# Patient Record
Sex: Male | Born: 1955 | Race: White | Hispanic: No | Marital: Married | Smoking: Never smoker
Health system: Southern US, Community
[De-identification: ages and names within clinical notes are randomized; demographics above are authoritative.]

## PROBLEM LIST (undated history)

## (undated) DIAGNOSIS — R519 Headache, unspecified: Secondary | ICD-10-CM

## (undated) DIAGNOSIS — Z8679 Personal history of other diseases of the circulatory system: Secondary | ICD-10-CM

## (undated) DIAGNOSIS — N200 Calculus of kidney: Secondary | ICD-10-CM

## (undated) DIAGNOSIS — I712 Thoracic aortic aneurysm, without rupture: Secondary | ICD-10-CM

## (undated) DIAGNOSIS — Z87442 Personal history of urinary calculi: Secondary | ICD-10-CM

## (undated) DIAGNOSIS — R51 Headache: Secondary | ICD-10-CM

## (undated) DIAGNOSIS — G4730 Sleep apnea, unspecified: Secondary | ICD-10-CM

## (undated) DIAGNOSIS — I7121 Aneurysm of the ascending aorta, without rupture: Secondary | ICD-10-CM

## (undated) DIAGNOSIS — E0590 Thyrotoxicosis, unspecified without thyrotoxic crisis or storm: Secondary | ICD-10-CM

## (undated) DIAGNOSIS — M1990 Unspecified osteoarthritis, unspecified site: Secondary | ICD-10-CM

## (undated) DIAGNOSIS — I10 Essential (primary) hypertension: Secondary | ICD-10-CM

## (undated) DIAGNOSIS — K219 Gastro-esophageal reflux disease without esophagitis: Secondary | ICD-10-CM

## (undated) DIAGNOSIS — I350 Nonrheumatic aortic (valve) stenosis: Secondary | ICD-10-CM

## (undated) DIAGNOSIS — R011 Cardiac murmur, unspecified: Secondary | ICD-10-CM

## (undated) DIAGNOSIS — I639 Cerebral infarction, unspecified: Secondary | ICD-10-CM

## (undated) DIAGNOSIS — R7302 Impaired glucose tolerance (oral): Secondary | ICD-10-CM

## (undated) DIAGNOSIS — T7840XA Allergy, unspecified, initial encounter: Secondary | ICD-10-CM

## (undated) HISTORY — PX: CARDIAC CATHETERIZATION: SHX172

## (undated) HISTORY — DX: Calculus of kidney: N20.0

## (undated) HISTORY — DX: Personal history of other diseases of the circulatory system: Z86.79

## (undated) HISTORY — PX: UMBILICAL HERNIA REPAIR: SHX196

## (undated) HISTORY — PX: CARDIAC VALVE REPLACEMENT: SHX585

## (undated) HISTORY — PX: CYSTOSCOPY: SUR368

## (undated) HISTORY — DX: Impaired glucose tolerance (oral): R73.02

## (undated) HISTORY — PX: HERNIA REPAIR: SHX51

## (undated) HISTORY — DX: Allergy, unspecified, initial encounter: T78.40XA

## (undated) HISTORY — PX: ESOPHAGOGASTRODUODENOSCOPY: SHX1529

## (undated) HISTORY — DX: Cerebral infarction, unspecified: I63.9

---

## 2001-08-08 ENCOUNTER — Encounter: Payer: Self-pay | Admitting: Family Medicine

## 2001-08-08 ENCOUNTER — Ambulatory Visit (HOSPITAL_COMMUNITY): Admission: RE | Admit: 2001-08-08 | Discharge: 2001-08-08 | Payer: Self-pay | Admitting: Family Medicine

## 2001-08-11 ENCOUNTER — Encounter: Payer: Self-pay | Admitting: Family Medicine

## 2001-08-11 ENCOUNTER — Ambulatory Visit (HOSPITAL_COMMUNITY): Admission: RE | Admit: 2001-08-11 | Discharge: 2001-08-11 | Payer: Self-pay | Admitting: Family Medicine

## 2001-09-15 ENCOUNTER — Ambulatory Visit (HOSPITAL_COMMUNITY): Admission: RE | Admit: 2001-09-15 | Discharge: 2001-09-15 | Payer: Self-pay | Admitting: Urology

## 2001-09-15 ENCOUNTER — Encounter: Payer: Self-pay | Admitting: Urology

## 2003-12-25 HISTORY — PX: KNEE ARTHROSCOPY: SUR90

## 2004-06-12 ENCOUNTER — Ambulatory Visit (HOSPITAL_COMMUNITY): Admission: RE | Admit: 2004-06-12 | Discharge: 2004-06-12 | Payer: Self-pay | Admitting: Pulmonary Disease

## 2004-07-20 ENCOUNTER — Ambulatory Visit (HOSPITAL_COMMUNITY): Admission: RE | Admit: 2004-07-20 | Discharge: 2004-07-20 | Payer: Self-pay | Admitting: Orthopedic Surgery

## 2004-08-15 ENCOUNTER — Ambulatory Visit (HOSPITAL_COMMUNITY): Admission: RE | Admit: 2004-08-15 | Discharge: 2004-08-15 | Payer: Self-pay | Admitting: Orthopedic Surgery

## 2004-08-17 ENCOUNTER — Encounter (HOSPITAL_COMMUNITY): Admission: RE | Admit: 2004-08-17 | Discharge: 2004-09-16 | Payer: Self-pay | Admitting: Orthopedic Surgery

## 2004-10-30 ENCOUNTER — Ambulatory Visit: Payer: Self-pay | Admitting: Orthopedic Surgery

## 2004-12-06 ENCOUNTER — Ambulatory Visit: Payer: Self-pay | Admitting: Orthopedic Surgery

## 2005-03-17 ENCOUNTER — Ambulatory Visit: Admission: RE | Admit: 2005-03-17 | Discharge: 2005-03-17 | Payer: Self-pay | Admitting: Pulmonary Disease

## 2005-03-21 ENCOUNTER — Ambulatory Visit: Payer: Self-pay | Admitting: Pulmonary Disease

## 2006-03-15 ENCOUNTER — Ambulatory Visit: Payer: Self-pay | Admitting: Internal Medicine

## 2006-03-15 ENCOUNTER — Encounter: Payer: Self-pay | Admitting: Emergency Medicine

## 2006-03-15 ENCOUNTER — Inpatient Hospital Stay (HOSPITAL_COMMUNITY): Admission: EM | Admit: 2006-03-15 | Discharge: 2006-03-16 | Payer: Self-pay | Admitting: Emergency Medicine

## 2006-06-14 ENCOUNTER — Ambulatory Visit (HOSPITAL_COMMUNITY): Admission: RE | Admit: 2006-06-14 | Discharge: 2006-06-14 | Payer: Self-pay | Admitting: General Surgery

## 2006-08-22 ENCOUNTER — Ambulatory Visit (HOSPITAL_COMMUNITY): Admission: RE | Admit: 2006-08-22 | Discharge: 2006-08-22 | Payer: Self-pay | Admitting: General Surgery

## 2007-07-16 ENCOUNTER — Ambulatory Visit (HOSPITAL_COMMUNITY): Admission: RE | Admit: 2007-07-16 | Discharge: 2007-07-16 | Payer: Self-pay | Admitting: Pulmonary Disease

## 2007-08-18 ENCOUNTER — Ambulatory Visit (HOSPITAL_COMMUNITY): Admission: RE | Admit: 2007-08-18 | Discharge: 2007-08-18 | Payer: Self-pay | Admitting: Pulmonary Disease

## 2009-12-22 ENCOUNTER — Emergency Department (HOSPITAL_COMMUNITY): Admission: EM | Admit: 2009-12-22 | Discharge: 2009-12-22 | Payer: Self-pay | Admitting: Emergency Medicine

## 2011-01-14 ENCOUNTER — Encounter: Payer: Self-pay | Admitting: Pulmonary Disease

## 2011-05-11 NOTE — Procedures (Signed)
NAME:  Cameron [name], Cameron [name] NO.:  [id number]   MEDICAL RECORD NO.:  [id number]          PATIENT TYPE:  OUT   LOCATION:  SLEEP LAB                     FACILITY:  [facility]   PHYSICIAN:  Marcelyn Bruins, M.D. [facility] DATE OF BIRTH:  04/22/1956   DATE OF STUDY:  03/17/2005                              NOCTURNAL POLYSOMNOGRAM   REFERRING PHYSICIAN:  Dr. Kari Baars.   INDICATION FOR THE STUDY:  Hypersomnia with sleep apnea.   SLEEP ARCHITECTURE:  The patient had a total sleep time of 381 minutes with  a sleep efficiency of 89%. There was adequate REM but decreased slow wave  sleep. Sleep onset latency was normal as was REM onset.   IMPRESSION:  1.  Split night study reveals moderate obstructive sleep apnea/hypopnea      syndrome with 53 obstructive events noted in the first 147 minutes of      sleep. This gave the patient a respiratory disturbance index of 22      events per hour with O2 desaturation as low as 89%. There was moderate      snoring noted during the study. By protocol the patient was then placed      on a standard ResMed C-PAP mask and ultimately titrated to a final      pressure of 7 cm with excellent control of events.  2.  No clinically significant cardiac arrhythmia.  3.  Moderate numbers of leg jerks with some sleep disruption. Clinical      correlation is suggested after appropriate treatment for his sleep      apnea.      KC/MEDQ  D:  03/21/2005 12:32:36  T:  03/21/2005 15:57:16  Job:  [id number]

## 2011-05-11 NOTE — H&P (Signed)
NAME:  Cameron [name], Cameron [name] NO.:  [id number]   MEDICAL RECORD NO.:  [id number]          PATIENT TYPE:  AMB   LOCATION:  DAY                           FACILITY:  [facility]   PHYSICIAN:  Dalia Heading, M.D.  DATE OF BIRTH:  07-Jun-1956   DATE OF ADMISSION:  06/14/2006  DATE OF DISCHARGE:  LH                                HISTORY & PHYSICAL   CHIEF COMPLAINT:  Umbilical hernia.   HISTORY OF PRESENT ILLNESS:  The patient is a 55 year old white male who was  referred for evaluation treatment of umbilical hernia.  He has had an  episode of abdominal pain with protrusion of the umbilical hernia last week.  Did resolve spontaneously.  He was found on CT scan of the abdomen to have  mild sigmoid diverticulitis which has resolved.  No nausea or vomiting have  been noted.   PAST MEDICAL HISTORY:  Was unremarkable.   PAST SURGICAL HISTORY:  Lithotripsy, cardiac catheterization, cystoscopy  with stone extraction.   CURRENT MEDICATIONS:  1.  Ciprofloxacin 500 mg p.o. b.i.d.  2.  Metronidazole 500 mg p.o. 3 times a day.  3.  Baby aspirin which he is holding.  4.  Ultram as needed for chronic back pain.  5.  Protonix 40 mg p.o. daily.   ALLERGIES:  No known drug allergies.   REVIEW OF SYSTEMS:  The patient denies any smoking or alcohol use.  He  denies any recent MI, CVA, or diabetes mellitus.   He denies any bleeding disorders.   PHYSICAL EXAMINATION:  GENERAL: The patient is a well-developed, well-  nourished white male in no acute distress.  LUNGS:  Clear to auscultation with equal breath sounds bilaterally.  HEART: Examination reveals regular rate and rhythm without S3, S4, or  murmurs.  ABDOMEN: Soft, nontender, nondistended.  No hepatosplenomegaly or masses  noted.  A reducible umbilical hernia is present.   IMPRESSION:  Umbilical hernia.   PLAN:  The patient is scheduled for an umbilical herniorrhaphy on June 14, 2006.  Risks and benefits of the procedure  including bleeding, infection,  recurrence of the hernia were fully explained to the patient, gave informed  consent.      Dalia Heading, M.D.  Electronically Signed     MAJ/MEDQ  D:  06/11/2006  T:  06/11/2006  Job:  [id number]   cc:   [facility]  Fax: [phone]   Oneal Deputy. Juanetta Gosling, M.D.  Fax: [phone]

## 2011-05-11 NOTE — Op Note (Signed)
NAME:  Cameron [name], Cameron [name]                         ACCOUNT NO.:  [id number]   MEDICAL RECORD NO.:  [id number]                   PATIENT TYPE:  AMB   LOCATION:  DAY                                  FACILITY:  [facility]   PHYSICIAN:  Vickki Hearing, M.D.           DATE OF BIRTH:  [birth year]   DATE OF PROCEDURE:  08/15/2004  DATE OF DISCHARGE:                                 OPERATIVE REPORT   HISTORY:  Abisai Deer presented with left knee pain without history of  trauma. He did remember increased activity, walking 15 to 16 hours per day  on concrete, but no direct injury. Complained of medial site of pain with  clicking and popping inside the knee. Initial treatment with anti-  inflammatories, 500-mg Relafen b.i.d., did not help. He had findings  consistent with medial meniscal tear and was sent for MRI. After MRI which  was inconclusive, we continued with nonoperative treatment, but due to the  horizontal signal change and possible horizontal tear of the meniscus on MRI  and his continued pain and dysfunction of the knee despite bracing, we  offered him surgery which he accepted.   PREOPERATIVE DIAGNOSIS:  Medial meniscal tear.   POSTOPERATIVE DIAGNOSIS:  Chondromalacia, medial femoral condyle and  patella, with small synovial pathologic plica.   PROCEDURE:  Chondroplasty, excision of plica.   SURGEON:  Vickki Hearing, M.D.   ANESTHESIA:  General.   OPERATIVE FINDINGS:  There was a softening of the cartilage along the medial  femoral condyle at its medial edge where he complained of pain. There was a  kissing lesion on the medial facet of the patella along with a small  synovial plica which appeared pathologic.   DESCRIPTION OF PROCEDURE:  After being identified in the preoperative  holding area and marking the left knee as the surgical site, the chart was  reviewed to confirm the left knee was the surgical site and arthroscopy was  to be performed. Mr. Lardizabal had previously  marked his left knee as the  surgical area. We took him to the operating room for general anesthesia,  prepped and draped his leg using sterile technique. After this, we took a  time out and then continued with the procedure. During the time out, we  noted that the patient was indeed Fredderick Phenix, that he was to have his  left knee operated on for arthroscopy, everyone agreed, and we continued.   The knee was addressed with the two-incision diagnostic arthroscopy. We did  a circumferential tour of the knee and palpated and visualized all  structures. Patellofemoral compartment was noted to be normal except for its  medial facet where there was a kissing lesion with the medial femoral  condyle, and there was softening of the cartilage with a flap of cartilage  from the medial facet. There was a plica in this area which appeared to be  rubbing the chondral flap on  the medial femoral condyle, and this was  debrided using a shaver and a 90-degree ArthroCare wand. We irrigated the  knee, suctioned it dry, applied steri strips, and 30 cc of Marcaine was  injected into the knee. A sterile  bandage was applied as well as CryoCuff. He was extubated, taken to the  recovery room in stable condition. He can start full weight bearing and can  start physical therapy as soon as possible. He will be followed in 2 days  for a dressing change and wound check.      ___________________________________________                                            Vickki Hearing, M.D.   SEH/MEDQ  D:  08/15/2004  T:  08/15/2004  Job:  [id number]

## 2011-05-11 NOTE — H&P (Signed)
NAME:  GROVER, ROBINSON NO.:  [id number]   MEDICAL RECORD NO.:  [id number]          PATIENT TYPE:  INP   LOCATION:  2038                         FACILITY:  [facility]   PHYSICIAN:  Pricilla Riffle, M.D.    DATE OF BIRTH:  [birth year]   DATE OF ADMISSION:  03/15/2006  DATE OF DISCHARGE:                                HISTORY & PHYSICAL   IDENTIFICATION:  Mr. Muegge is a 55 year old gentleman who comes to the  emergency room today for evaluation of chest pain.   HISTORY OF PRESENT ILLNESS:  The patient has a history of mitral valve  prolapse.  He was diagnosed at time of cath in 1992/1993.  Note, he had a  cardiac catheterization at the time for chest pain which was different than  today's, by his report it was normal though we have no records.  The patient  yesterday began having chest discomfort at work about 5 p.m. that was like  somebody was sitting over his chest with a mild intensity but continued on  through the night.  He went to work today and about 8:30 the pain  intensified to 5 or 6 out of 10.  A co-worker at the time said he looked a  little ashen.  He was feeling weak.  He notes no pleuritic component.  No  positional component.  No cough, fevers, chills.  He feels chilled here in  the ER but not prior.  He does have a history of reflux but this is  different.  Given two nitroglycerin sprays by EMS without much relief and  one sublingual nitro with a decrease in pain to a 2 out of 10.  He has some  associated nausea, notes no change in his bowel movements.   ALLERGIES:  None.   MEDICATIONS:  Ultram 50 mg one half tab b.i.d.  Here at [facility] ER,  heparin given, nitroglycerin held because of hypotension.   PAST MEDICAL HISTORY:  1.  Mitral valve prolapse by report.  2.  History of nephrolithiasis.  3.  History of moderate obstructive sleep apnea.  The patient wears CPAP.  4.  History of arthritis.  5.  History of hypoglycemia.  6.  Question CVA in 1993,  a year after the cath.  We have no records on      this, may have some short term memory loss.  Had weakness on his left      side from this he said, but again no record.   SOCIAL HISTORY:  The patient is a Midwife.  He lives in [place],  is married with children, never smoked, rarely drinks.  Walks a lot.  Wife  notes some fatigue recently.   FAMILY HISTORY:  Mother is 51, had a history of stroke.  She has a  pacemaker.  Father died of cancer, age 20.  One sister with kidney problems,  unknown type.   REVIEW OF SYSTEMS:  All systems reviewed except as noted above.  Negative to  the above problem.   PHYSICAL EXAMINATION:  GENERAL:  The patient currently notes his  chest  pressure is a 2 out of 10 in intensity, though he appears in no acute  distress, somewhat nauseated though.  VITAL SIGNS:  Blood pressure 105/65, pulse is 50-55 and regular, temperature  is 97.9, O2 sat on 2 liters is 100%.  HEENT:  Normocephalic atraumatic.  EOMI.  Sclerae are clear.  NECK:  No bruits.  JVP is normal.  No thyromegaly.  LUNGS:  Clear to auscultation.  CARDIAC:  Regular rate and rhythm.  S1 S2.  No S3, S4, murmurs, or clicks  noted.  ABDOMEN:  Supple.  Nontender.  No hepatosplenomegaly.  EXTREMITIES:  Good distal pulses throughout.  No lower extremity edema.  No  bruits.  NEUROLOGIC:  Alert and oriented x3.  Cranial nerves II-XII intact.   EKG shows a normal sinus rhythm at a rate of 64 beats per minute.  Left  anterior fascicular block.   LABORATORY:  Significant for a hemoglobin of 14.8, WBC of 8.1, BUN  creatinine of 9 and 0.9, potassium of 3.9.  D-dimer is 0.24.  BNP less than  30.  Troponin less than 0.05.  MB was less than 1.   IMPRESSION:  The patient is a 55 year old with a history of mitral valve  prolapse, question cerebrovascular accident in the past - no records, comes  in now with chest pain since yesterday.   No acute ST changes.  Initial CK was negative after prolonged  pain.  The  pain is atypical for angina, though we will continue to follow with serial  enzymes.  We will treat also for gastrointestinal with Protonix, GI  cocktail. If the enzymes are negative, would recommend a Cardiolite/Myoview  to evaluate otherwise if positive recommend cardiac catheterization.  Check  fasting lipids in the morning.  Continue CPAP.           ______________________________  Pricilla Riffle, M.D.     PVR/MEDQ  D:  03/15/2006  T:  03/16/2006  Job:  [id number]   cc:   Ramon Dredge L. Juanetta Gosling, M.D.  Fax: [phone]

## 2011-05-11 NOTE — H&P (Signed)
NAME:  KIT, MOLLETT NO.:  [id number]   MEDICAL RECORD NO.:  [id number]           PATIENT TYPE:  AMB   LOCATION:                                FACILITY:  [facility]   PHYSICIAN:  Dalia Heading, M.D.  DATE OF BIRTH:  1956-11-27   DATE OF ADMISSION:  08/22/2006  DATE OF DISCHARGE:  LH                                HISTORY & PHYSICAL   CHIEF COMPLAINT:  History of sigmoid diverticulitis.   HISTORY OF PRESENT ILLNESS:  The patient is a 55 year old white male who  presents for colonoscopy.  He had an episode of sigmoid diverticulitis  earlier this year and is now presenting for followup colonoscopy.  Denies  any abdominal pain, nausea, vomiting, diarrhea, or constipation.  Denies any  hematochezia.   PAST MEDICAL HISTORY:  Unremarkable.   PAST SURGICAL HISTORY:  1. Lithotripsy.  2. Umbilical herniorrhaphy.  3. Cardiac catheterization.  4. Cystoscopy   CURRENT MEDICATIONS:  Baby aspirin, Protonix.   ALLERGIES:  No known drug allergies.   REVIEW OF SYSTEMS:  Noncontributory.   PHYSICAL EXAMINATION:  GENERAL: The patient is a well-developed, well-  nourished white male in no acute distress.  LUNGS:  Clear to auscultation with equal breath sounds bilaterally.  HEART: Examination reveals regular rate and rhythm without S3, S4, murmurs.  ABDOMEN: The abdomen is soft, nontender, nondistended.  No  hepatosplenomegaly or masses are noted.  RECTAL:  Examination was deferred to the procedure.   IMPRESSION:  History of sigmoid diverticulitis.   PLAN:  The patient is scheduled for colonoscopy on August 22, 2006.  The  risks and benefits of the procedure including bleeding and perforation were  fully explained to the patient, gave informed consent.      Dalia Heading, M.D.  Electronically Signed     MAJ/MEDQ  D:  08/13/2006  T:  08/13/2006  Job:  [id number]   cc:   [facility]  Fax: [phone]   Oneal Deputy. Juanetta Gosling, M.D.  Fax: [phone]

## 2011-05-11 NOTE — Op Note (Signed)
NAME:  TRACEN, MAHLER NO.:  [id number]   MEDICAL RECORD NO.:  [id number]          PATIENT TYPE:  AMB   LOCATION:  DAY                           FACILITY:  [facility]   PHYSICIAN:  Dalia Heading, M.D.  DATE OF BIRTH:  08-09-1956   DATE OF PROCEDURE:  06/14/2006  DATE OF DISCHARGE:                                 OPERATIVE REPORT   PREOPERATIVE DIAGNOSIS:  Umbilical hernia.   POSTOPERATIVE DIAGNOSIS:  Umbilical hernia.   PROCEDURE:  Umbilical herniorrhaphy.   SURGEON:  Dalia Heading, M.D.   ANESTHESIA:  General endotracheal.   INDICATIONS:  The patient is a 55 year old white male who presents with an  umbilical hernia which is symptomatic.  The risks and benefits of the  procedure including bleeding, infection, and recurrence of the umbilical  hernia were fully explained to the patient, who gave informed consent.   PROCEDURE NOTE:  The patient was placed in the supine position.  After  general endotracheal anesthesia was administered, the abdomen was prepped  and draped in the usual sterile technique with Betadine.  Surgical site  confirmation was performed.   An infraumbilical incision was made down to the fascia.  The umbilicus was  freed away from the underlying hernia defect.  Properitoneal fat and a small  piece of omentum was noted within the hernia.  This was excised along with  the hernia sac.  The contents were reduced.  The defect was less than 1 cm  in its greatest diameter.  The defect was closed transversely using #0  Surgitek interrupted sutures.  The base of the umbilicus was secured back to  the fascia using a 2-0 Vicryl interrupted suture.  Subcutaneous layer was  reapproximated using a 3-0 Vicryl interrupted suture.  The skin was closed  using staples.  Then 0.5 cm Sensorcaine was instilled into the surrounding  wound.  Betadine ointment and dry sterile dressing were then applied.   All tape and needle counts were correct at the end of the  procedure.  The  patient was extubated in the operating room and went back to recovery room  awake in stable condition.   COMPLICATIONS:  None.   SPECIMEN:  None.   BLOOD LOSS:  Minimal.      Dalia Heading, M.D.  Electronically Signed     MAJ/MEDQ  D:  06/14/2006  T:  06/14/2006  Job:  [id number]   cc:   Ramon Dredge L. Juanetta Gosling, M.D.  Fax: [phone]

## 2011-05-11 NOTE — H&P (Signed)
NAME:  Cameron [name], COLLEGE                         ACCOUNT NO.:  [id number]   MEDICAL RECORD NO.:  [id number]                   PATIENT TYPE:  AMB   LOCATION:  DAY                                  FACILITY:  [facility]   PHYSICIAN:  Vickki Hearing, M.D.           DATE OF BIRTH:  1956/08/31   DATE OF ADMISSION:  08/14/2004  DATE OF DISCHARGE:                                HISTORY & PHYSICAL   CHIEF COMPLAINT:  Left knee pain.   HISTORY OF PRESENT ILLNESS:  Cameron [name] presented with left knee pain  without history of trauma.  He remembers increased activity such as walking  15-16 hours on concrete, but other than that, no direct injury.  He  complains of severe medial sided knee pain with an associated clicking and  popping sensation inside the knee.  Initial treatment was with anti-  inflammatories 500 mg of Relafen b.i.d.  Clinical exam was consistent with  meniscal tear and he was sent for MRI.  After MRI, we continued with  nonoperative treatment.  We did note that he had horizontal type signal  change perhaps consistent with horizontal meniscal tear of medial meniscus.  We put him in a brace and he still did not improve and after 8 weeks now, he  presents for medial menisectomy of the left knee with surgical diagnostic  and operative arthroscopy.   PAST MEDICAL HISTORY:  1. Mitral valve regurgitation which requires prophylactic treatment prior to     any surgery with antibiotics.  2. History of kidney stones.   ALLERGIES:  No known drug allergies.   PAST SURGICAL HISTORY:  1. Cardiac catheterization in 1992.  2. Lithotripsy in 2003.   MEDICATIONS:  Ultram 50 mg.   FAMILY HISTORY:  Family history of heart and kidney disease as well as  cancer.   PRIMARY CARE PHYSICIAN:  Edward L. Juanetta Gosling, M.D.   SOCIAL HISTORY:  He is married and works at the [employer].  No  social habits such as smoking or drinking and has a high school diploma.   PHYSICAL EXAMINATION:   VITAL SIGNS:  Weight 165, pulse 82, respirations 20.  HEENT:  Within normal limits.  NECK:  Supple.  CHEST:  Clear.  HEART:  Rate and rhythm.  ABDOMEN:  Soft.  EXTREMITIES:  Left knee medial tenderness in the compartment and joint line.  Hyperflexion causes pain as does hyperextension with the screw home test.  Stability and strength are otherwise normal.  His alignment is good without  contracture, subluxation, atrophy or tremor in the musculature.  SKIN:  Intact.  NEUROLOGIC:  Normal sensation and reflexes.  He is awake, alert and oriented  x3.   ASSESSMENT/PLAN:  MRI shows horizontal signal change consistent with most  likely medial meniscal tear.  Recommend arthroscopy of left knee.     ___________________________________________  Vickki Hearing, M.D.   SEH/MEDQ  D:  08/14/2004  T:  08/14/2004  Job:  [id number]

## 2011-05-11 NOTE — Discharge Summary (Signed)
NAMEMarland Kitchen  [name], [name] NO.:  [id number]   MEDICAL RECORD NO.:  [id number]          PATIENT TYPE:  INP   LOCATION:  2038                         FACILITY:  [facility]   PHYSICIAN:  Verne Grain, MD   DATE OF BIRTH:  December 09, 1956   DATE OF ADMISSION:  03/15/2006  DATE OF DISCHARGE:  03/16/2006                                 DISCHARGE SUMMARY   REASON FOR ADMISSION:  Chest discomfort.   DISCHARGE DIAGNOSIS:  1.  Noncardiac chest pain.  2.  Mitral valve prolapse.  3.  Questionable history of cerebrovascular accident in 1993-no records      available.  4.  History of cardiac catheterization in 1992.      1.  Normal, per patient report-no records available.  5.  Obstructive sleep apnea treated with continuous positive airway      pressure.  6.  History of arthritis.  7.  History of nephrolithiasis.  8.  Hypertriglyceridemia.      1.  Total cholesterol this admission 155, triglycerides 237, HDL 42 and          LDL 66.   ALLERGIES:  NO KNOWN DRUG ALLERGIES.   HISTORY:  the patient is a 55 year old male with a history of mitral valve  prolapse who presented to [facility] ER with complaints of chest discomfort  for about 1 day.  He was transferred to [facility] for further  evaluation.   HOSPITAL COURSE:  As noted above, the patient was transferred for further  evaluation of his chest discomfort.  He ruled out for myocardial infarction  by enzymes and electrocardiogram.  He went for a stress Myoview study on  March 16, 2006.  He exercised for 9 minutes and 59 seconds.  The test had to  be stopped secondary to fatigue and he did not achieve 85% of his predicted  maximum heart rate.  Therefore, the test was changed to an adenosine test.  Images revealed no ischemia and an EF of 71%.  The patient was discharged to  home in stable condition by Dr. Christin Fudge.  It was felt that he can follow up  with his primary care physician and could follow up with cardiology on  a  p.r.n. basis.   LABORATORY AT DISCHARGE:  White count 7300, hemoglobin 13.8, hematocrit 40.3  and platelet count 198,000.  INR 1.  Sodium 138, potassium 3.9, glucose 102,  BUN 11 and creatinine 1.  Total protein 5.4, albumin 3.4, AST 15, ALT 20,  alkaline phosphatase 33 and total bilirubin 0.7.  Cardiac enzymes as noted  above.  Lipid panel is noted above.  TSH 1.89.  D-dimer 0.24.  Chest x-ray  no acute disease.  BNP level less than 30.   DISCHARGE MEDICATIONS:  1.  Protonix 40 mg daily.  2.  Ultram p.r.n.  3.  Coated aspirin 81 mg daily.   FOLLOW UP:  Dr. Juanetta [name] in 1-2 weeks post discharge and the patient was  asked to call for an appointment.  He can follow up with [facility]  in [place] as needed.  Tereso Newcomer, P.A.    ______________________________  Verne Grain, MD    SW/MEDQ  D:  04/30/2006  T:  05/01/2006  Job:  [id number]   cc:   Cameron [name], M.D.  Fax: [phone]

## 2015-11-24 HISTORY — PX: LITHOTRIPSY: SUR834

## 2015-12-01 ENCOUNTER — Ambulatory Visit (HOSPITAL_COMMUNITY)
Admission: RE | Admit: 2015-12-01 | Discharge: 2015-12-01 | Disposition: A | Discharge status: Home / Self Care | Payer: BLUE CROSS/BLUE SHIELD | Source: Ambulatory Visit | Attending: Pulmonary Disease | Admitting: Pulmonary Disease

## 2015-12-01 ENCOUNTER — Other Ambulatory Visit (HOSPITAL_COMMUNITY): Payer: Self-pay | Admitting: Pulmonary Disease

## 2015-12-01 DIAGNOSIS — N132 Hydronephrosis with renal and ureteral calculous obstruction: Secondary | ICD-10-CM | POA: Insufficient documentation

## 2015-12-01 DIAGNOSIS — N200 Calculus of kidney: Secondary | ICD-10-CM | POA: Diagnosis not present

## 2015-12-01 DIAGNOSIS — I2510 Atherosclerotic heart disease of native coronary artery without angina pectoris: Secondary | ICD-10-CM | POA: Diagnosis not present

## 2015-12-01 DIAGNOSIS — K4090 Unilateral inguinal hernia, without obstruction or gangrene, not specified as recurrent: Secondary | ICD-10-CM | POA: Insufficient documentation

## 2015-12-01 DIAGNOSIS — R1084 Generalized abdominal pain: Secondary | ICD-10-CM

## 2015-12-01 DIAGNOSIS — N201 Calculus of ureter: Secondary | ICD-10-CM | POA: Insufficient documentation

## 2015-12-01 DIAGNOSIS — N400 Enlarged prostate without lower urinary tract symptoms: Secondary | ICD-10-CM | POA: Insufficient documentation

## 2015-12-01 DIAGNOSIS — R109 Unspecified abdominal pain: Secondary | ICD-10-CM | POA: Diagnosis present

## 2015-12-02 ENCOUNTER — Encounter (HOSPITAL_COMMUNITY)
Admission: AD | Disposition: A | Discharge status: Home / Self Care | Payer: Self-pay | Source: Ambulatory Visit | Attending: Urology

## 2015-12-02 ENCOUNTER — Encounter (HOSPITAL_COMMUNITY): Payer: Self-pay | Admitting: *Deleted

## 2015-12-02 ENCOUNTER — Other Ambulatory Visit: Payer: Self-pay | Admitting: Urology

## 2015-12-02 ENCOUNTER — Ambulatory Visit (HOSPITAL_COMMUNITY): Payer: BLUE CROSS/BLUE SHIELD | Admitting: Anesthesiology

## 2015-12-02 ENCOUNTER — Ambulatory Visit (INDEPENDENT_AMBULATORY_CARE_PROVIDER_SITE_OTHER): Payer: BLUE CROSS/BLUE SHIELD | Admitting: Urology

## 2015-12-02 ENCOUNTER — Ambulatory Visit (HOSPITAL_COMMUNITY)
Admission: AD | Admit: 2015-12-02 | Discharge: 2015-12-02 | Disposition: A | Discharge status: Home / Self Care | Payer: BLUE CROSS/BLUE SHIELD | Source: Ambulatory Visit | Attending: Urology | Admitting: Urology

## 2015-12-02 DIAGNOSIS — F17210 Nicotine dependence, cigarettes, uncomplicated: Secondary | ICD-10-CM | POA: Insufficient documentation

## 2015-12-02 DIAGNOSIS — M1990 Unspecified osteoarthritis, unspecified site: Secondary | ICD-10-CM | POA: Diagnosis not present

## 2015-12-02 DIAGNOSIS — E0590 Thyrotoxicosis, unspecified without thyrotoxic crisis or storm: Secondary | ICD-10-CM | POA: Insufficient documentation

## 2015-12-02 DIAGNOSIS — Z79899 Other long term (current) drug therapy: Secondary | ICD-10-CM | POA: Insufficient documentation

## 2015-12-02 DIAGNOSIS — N201 Calculus of ureter: Secondary | ICD-10-CM

## 2015-12-02 DIAGNOSIS — I10 Essential (primary) hypertension: Secondary | ICD-10-CM | POA: Diagnosis not present

## 2015-12-02 DIAGNOSIS — F17290 Nicotine dependence, other tobacco product, uncomplicated: Secondary | ICD-10-CM | POA: Diagnosis not present

## 2015-12-02 DIAGNOSIS — E119 Type 2 diabetes mellitus without complications: Secondary | ICD-10-CM | POA: Diagnosis not present

## 2015-12-02 DIAGNOSIS — G4730 Sleep apnea, unspecified: Secondary | ICD-10-CM | POA: Diagnosis not present

## 2015-12-02 DIAGNOSIS — Z87442 Personal history of urinary calculi: Secondary | ICD-10-CM | POA: Insufficient documentation

## 2015-12-02 DIAGNOSIS — K219 Gastro-esophageal reflux disease without esophagitis: Secondary | ICD-10-CM | POA: Insufficient documentation

## 2015-12-02 HISTORY — DX: Gastro-esophageal reflux disease without esophagitis: K21.9

## 2015-12-02 HISTORY — DX: Thyrotoxicosis, unspecified without thyrotoxic crisis or storm: E05.90

## 2015-12-02 HISTORY — DX: Cardiac murmur, unspecified: R01.1

## 2015-12-02 HISTORY — PX: CYSTOSCOPY W/ URETERAL STENT PLACEMENT: SHX1429

## 2015-12-02 HISTORY — DX: Unspecified osteoarthritis, unspecified site: M19.90

## 2015-12-02 HISTORY — DX: Headache: R51

## 2015-12-02 HISTORY — DX: Sleep apnea, unspecified: G47.30

## 2015-12-02 HISTORY — DX: Headache, unspecified: R51.9

## 2015-12-02 HISTORY — DX: Essential (primary) hypertension: I10

## 2015-12-02 SURGERY — CYSTOSCOPY, WITH RETROGRADE PYELOGRAM AND URETERAL STENT INSERTION
Anesthesia: General | Laterality: Left

## 2015-12-02 MED ORDER — CEFAZOLIN SODIUM-DEXTROSE 2-3 GM-% IV SOLR
2.0000 g | INTRAVENOUS | Status: AC
  Administered 2015-12-02: 2 g via INTRAVENOUS

## 2015-12-02 MED ORDER — ACETAMINOPHEN 10 MG/ML IV SOLN
INTRAVENOUS | Status: AC
  Filled 2015-12-02: qty 100

## 2015-12-02 MED ORDER — LIDOCAINE HCL (CARDIAC) 20 MG/ML IV SOLN
INTRAVENOUS | Status: AC
  Filled 2015-12-02: qty 5

## 2015-12-02 MED ORDER — ONDANSETRON HCL 4 MG/2ML IJ SOLN: 4.0000 mg | Freq: Once | INTRAMUSCULAR | Status: DC | PRN

## 2015-12-02 MED ORDER — FENTANYL CITRATE (PF) 100 MCG/2ML IJ SOLN
50.0000 ug | INTRAMUSCULAR | Status: AC | PRN
  Administered 2015-12-02 (×3): 50 ug via INTRAVENOUS
  Filled 2015-12-02: qty 2

## 2015-12-02 MED ORDER — LIDOCAINE HCL (CARDIAC) 20 MG/ML IV SOLN
INTRAVENOUS | Status: DC | PRN
  Administered 2015-12-02: 60 mg via INTRAVENOUS

## 2015-12-02 MED ORDER — CEFAZOLIN SODIUM-DEXTROSE 2-3 GM-% IV SOLR
INTRAVENOUS | Status: AC
  Filled 2015-12-02: qty 50

## 2015-12-02 MED ORDER — LACTATED RINGERS IV SOLN
INTRAVENOUS | Status: DC
  Administered 2015-12-02: 19:00:00 via INTRAVENOUS

## 2015-12-02 MED ORDER — TAMSULOSIN HCL 0.4 MG PO CAPS: 0.4000 mg | ORAL_CAPSULE | Freq: Every day | ORAL | Status: DC

## 2015-12-02 MED ORDER — ONDANSETRON 4 MG PO TBDP: 4.0000 mg | ORAL_TABLET | Freq: Three times a day (TID) | ORAL | Status: DC | PRN

## 2015-12-02 MED ORDER — MIDAZOLAM HCL 2 MG/2ML IJ SOLN
INTRAMUSCULAR | Status: AC
  Filled 2015-12-02: qty 2

## 2015-12-02 MED ORDER — OXYCODONE-ACETAMINOPHEN 5-325 MG PO TABS: 1.0000 | ORAL_TABLET | ORAL | Status: DC | PRN

## 2015-12-02 MED ORDER — ACETAMINOPHEN 10 MG/ML IV SOLN
1000.0000 mg | Freq: Once | INTRAVENOUS | Status: AC
  Administered 2015-12-02: 1000 mg via INTRAVENOUS

## 2015-12-02 MED ORDER — MIDAZOLAM HCL 5 MG/5ML IJ SOLN
INTRAMUSCULAR | Status: DC | PRN
  Administered 2015-12-02: 2 mg via INTRAVENOUS

## 2015-12-02 MED ORDER — SODIUM CHLORIDE 0.9 % IR SOLN
Status: DC | PRN
  Administered 2015-12-02 (×2): 3000 mL

## 2015-12-02 MED ORDER — ONDANSETRON HCL 4 MG/2ML IJ SOLN
INTRAMUSCULAR | Status: AC
  Filled 2015-12-02: qty 2

## 2015-12-02 MED ORDER — FENTANYL CITRATE (PF) 100 MCG/2ML IJ SOLN: 25.0000 ug | INTRAMUSCULAR | Status: DC | PRN

## 2015-12-02 MED ORDER — FENTANYL CITRATE (PF) 100 MCG/2ML IJ SOLN
INTRAMUSCULAR | Status: AC
  Filled 2015-12-02: qty 2

## 2015-12-02 MED ORDER — PROPOFOL 10 MG/ML IV BOLUS
INTRAVENOUS | Status: AC
  Filled 2015-12-02: qty 20

## 2015-12-02 MED ORDER — PROPOFOL 10 MG/ML IV BOLUS
INTRAVENOUS | Status: DC | PRN
  Administered 2015-12-02: 200 mg via INTRAVENOUS

## 2015-12-02 MED ORDER — PHENAZOPYRIDINE HCL 100 MG PO TABS: 100.0000 mg | ORAL_TABLET | Freq: Three times a day (TID) | ORAL | Status: DC | PRN

## 2015-12-02 MED ORDER — SUCCINYLCHOLINE CHLORIDE 20 MG/ML IJ SOLN
INTRAMUSCULAR | Status: DC | PRN
  Administered 2015-12-02: 100 mg via INTRAVENOUS

## 2015-12-02 MED ORDER — LACTATED RINGERS IV SOLN: INTRAVENOUS | Status: DC

## 2015-12-02 SURGICAL SUPPLY — 3 items
CLOTH BEACON ORANGE TIMEOUT ST (SAFETY) ×3 IMPLANT
PACK CYSTO (CUSTOM PROCEDURE TRAY) ×3 IMPLANT
STENT CONTOUR 6FRX26X.038 (STENTS) ×3 IMPLANT

## 2015-12-02 NOTE — Transfer of Care (Signed)
Immediate Anesthesia Transfer of Care Note  Patient: Cameron SpenceCharles R [name]  Procedure(s) Performed: Procedure(s): CYSTOSCOPY WITH RETROGRADE PYELOGRAM/URETERAL STENT PLACEMENT (Left)  Patient Location: PACU  Anesthesia Type:General  Level of Consciousness: awake, alert  and oriented  Airway & Oxygen Therapy: Patient Spontanous Breathing and Patient connected to face mask oxygen  Post-op Assessment: Report given to RN and Post -op Vital signs reviewed and stable  Post vital signs: Reviewed and stable  Last Vitals:  Filed Vitals:   12/02/15 1607  BP: 130/82  Pulse: 95  Temp: 36.4 C  Resp: 16    Complications: No apparent anesthesia complications

## 2015-12-02 NOTE — Brief Op Note (Signed)
12/02/2015  8:27 PM  PATIENT:  Cameron [name]  59 y.o. male  PRE-OPERATIVE DIAGNOSIS:  left ureteral obstruction  POST-OPERATIVE DIAGNOSIS:  Left ureteral calculus  PROCEDURE:   Cystoscopy, left retrograde pyelogram, left ureteral stent placement  SURGEON:  Surgeon(s) and Role:    * Malen GauzePatrick L McKenzie, MD - Primary  PHYSICIAN ASSISTANT:   ASSISTANTS: none   ANESTHESIA:   general  EBL:     BLOOD ADMINISTERED:none  DRAINS: left 6x26 JJ ureteral stent without tether  LOCAL MEDICATIONS USED:  NONE  SPECIMEN:  No Specimen  DISPOSITION OF SPECIMEN:  N/A  COUNTS:  YES  TOURNIQUET:  * No tourniquets in log *  DICTATION: .Note written in EPIC  PLAN OF CARE: Discharge to home after PACU  PATIENT DISPOSITION:  PACU - hemodynamically stable.   Delay start of Pharmacological VTE agent (>24hrs) due to surgical blood loss or risk of bleeding: not applicable

## 2015-12-02 NOTE — Anesthesia Preprocedure Evaluation (Addendum)
Anesthesia Evaluation  Patient identified by MRN, date of birth, ID band Patient awake    Reviewed: Allergy & Precautions, NPO status , Patient's Chart, lab work & pertinent test results  History of Anesthesia Complications Negative for: history of anesthetic complications  Airway Mallampati: III  TM Distance: >3 FB Neck ROM: Full    Dental no notable dental hx. (+) Dental Advisory Given   Pulmonary sleep apnea ,    Pulmonary exam normal breath sounds clear to auscultation       Cardiovascular hypertension, Pt. on medications Normal cardiovascular exam Rhythm:Regular Rate:Normal     Neuro/Psych  Headaches, negative psych ROS   GI/Hepatic Neg liver ROS, GERD  Medicated and Controlled,  Endo/Other  diabetes  Renal/GU negative Renal ROS  negative genitourinary   Musculoskeletal  (+) Arthritis ,   Abdominal   Peds negative pediatric ROS (+)  Hematology negative hematology ROS (+)   Anesthesia Other Findings   Reproductive/Obstetrics negative OB ROS                            Anesthesia Physical Anesthesia Plan  ASA: II and emergent  Anesthesia Plan: General   Post-op Pain Management:    Induction: Intravenous, Rapid sequence and Cricoid pressure planned  Airway Management Planned: Oral ETT  Additional Equipment:   Intra-op Plan:   Post-operative Plan: Extubation in OR  Informed Consent: I have reviewed the patients History and Physical, chart, labs and discussed the procedure including the risks, benefits and alternatives for the proposed anesthesia with the patient or authorized representative who has indicated his/her understanding and acceptance.   Dental advisory given  Plan Discussed with: CRNA  Anesthesia Plan Comments:         Anesthesia Quick Evaluation

## 2015-12-02 NOTE — Op Note (Signed)
.  Preoperative diagnosis: Left ureteral stone  Postoperative diagnosis: Same  Procedure: 1 cystoscopy 2. Left retrograde pyelography 3.  Intraoperative fluoroscopy, under one hour, with interpretation 4. Left 6 x 26 JJ stent placement  Attending: Wilkie AyePatrick Navy Rothschild  Anesthesia: General  Estimated blood loss: None  Drains: Left 6 x 26 JJ ureteral stent without tether  Specimens: none  Antibiotics: ancef  Findings: left proximal ureteral stone. Moderate hydronephrosis. No masses/lesions in the bladder. Ureteral orifices in normal anatomic location.  Indications: Patient is a 59 year old male with a history of left ureteral stone and intractable.  After discussing treatment options, they decided proceed with left ureteroscopic stone extraction and left stent placement.  Procedure her in detail: The patient was brought to the operating room and a brief timeout was done to ensure correct patient, correct procedure, correct site.  General anesthesia was administered patient was placed in dorsal lithotomy position.  Their genitalia was then prepped and draped in usual sterile fashion.  A rigid 22 French cystoscope was passed in the urethra and the bladder.  Bladder was inspected free masses or lesions.  the ureteral orifices were in the normal orthotopic locations.  a 6 french ureteral catheter was then instilled into the left ureteral orifice.  a gentle retrograde was obtained and findings noted above.  we then placed a zip wire through the ureteral catheter and advanced up to the renal pelvis.  The ureter was to narrow to accept the ureteroscope so we elected to place a stent.  We then placed a 6 x 26 double-j ureteral stent over the original zip wire.  We then removed the wire and good coil was noted in the the renal pelvis under fluoroscopy and the bladder under direct vision.  the bladder was then drained and this concluded the procedure which was well tolerated by patient.  Complications:  None  Condition: Stable, extubated, transferred to PACU  Plan: Patient is to be discharged home. He will have his stone extraction in 10-14 days.

## 2015-12-02 NOTE — H&P (Signed)
Urology Admission H&P  Chief Complaint: left flank pain  History of Present Illness: Mr Betker is a 59yo with a hx of nephrolithiasis who developed severe left flank pain 3 days ago. He presented to [facility] and was diagnosed with a 49m L UPJ calculus. He has failed MET and he has associated nausea/vomiting.  Past Medical History  Diagnosis Date  . Hypertension     due to kidney stones  . Heart murmur     told as child he had one  . Sleep apnea     has but does not use CPAP  . Hyperthyroidism     has appointment next 12/07/15 to get this assessed.  . Diabetes mellitus without complication (HCC)     elevated A1C  . GERD (gastroesophageal reflux disease)   . Headache     childhood headaches. Does not use migraine medications  . Arthritis     bilateral hips   Past Surgical History  Procedure Laterality Date  . Cystoscopy  1990s    with stone extraction  . Lithotripsy  1990s  . Umbilical hernia repair  late 1990s  . Knee arthroscopy Left 2005  . Esophagogastroduodenoscopy  1990s  . Cardiac catheterization  1990s    mitral valve was abnormal. No other results    Home Medications:  Prescriptions prior to admission  Medication Sig Dispense Refill Last Dose  . HYDROcodone-acetaminophen (NORCO/VICODIN) 5-325 MG tablet Take 1 tablet by mouth every 6 (six) hours as needed for moderate pain.   Past Week at Unknown time  . promethazine (PHENERGAN) 25 MG tablet Take 25 mg by mouth every 6 (six) hours as needed for nausea or vomiting.   Past Week at Unknown time  . traMADol (ULTRAM) 50 MG tablet Take 50-100 mg by mouth every 4 (four) hours as needed for moderate pain.   12/01/2015 at Unknown time   Allergies:  Allergies  Allergen Reactions  . Demerol [Meperidine] Other (See Comments)    Makes pt hyper, the higher the dose the more hyper pt gets  . Stadol [Butorphanol] Other (See Comments)    Hyper- the higher the dose the more hyper pt gets    History reviewed. No pertinent family  history. Social History:  reports that he has been passively smoking Cigarettes and Pipe.  He has smoked for the past 17 years. He does not have any smokeless tobacco history on file. He reports that he drinks alcohol. His drug history is not on file.  Review of Systems  Genitourinary: Positive for urgency and flank pain.  All other systems reviewed and are negative.   Physical Exam:  Vital signs in last 24 hours: Temp:  [97.6 F (36.4 C)] 97.6 F (36.4 C) (12/09 1607) Pulse Rate:  [95] 95 (12/09 1607) Resp:  [16] 16 (12/09 1607) BP: (130)/(82) 130/82 mmHg (12/09 1607) SpO2:  [98 %] 98 % (12/09 1607) Weight:  [68.493 kg (151 lb)] 68.493 kg (151 lb) (12/09 1607) Physical Exam  Constitutional: He is oriented to person, place, and time. He appears well-developed and well-nourished.  HENT:  Head: Normocephalic and atraumatic.  Eyes: EOM are normal. Pupils are equal, round, and reactive to light.  Neck: Normal range of motion. No thyromegaly present.  Cardiovascular: Normal rate and regular rhythm.   Respiratory: Effort normal. No respiratory distress.  GI: Soft. He exhibits no distension.  Musculoskeletal: Normal range of motion.  Neurological: He is alert and oriented to person, place, and time.  Skin: Skin is warm and  dry.  Psychiatric: He has a normal mood and affect. His behavior is normal. Judgment and thought content normal.    Laboratory Data:  No results found for this or any previous visit (from the past 24 hour(s)). No results found for this or any previous visit (from the past 240 hour(s)). Creatinine: No results for input(s): CREATININE in the last 168 hours. Baseline Creatinine: unknown  Impression/Assessment:  59yo with a left ureteral calculus, intractable pain  Plan:  The risks/benefits/alternatives to cysto, left retrograde, left ureteroscopic stone extraction was explained to the patient and he understands and wishes to proceed with surgery  Edoardo Laforte,  Nalayah Hitt L 12/02/2015, 7:36 PM

## 2015-12-02 NOTE — Progress Notes (Signed)
Paged Dr  Tiffany Kocherarigan of anesthesia for pain medications. Orders received.

## 2015-12-02 NOTE — Anesthesia Procedure Notes (Signed)
Procedure Name: Intubation Date/Time: 12/02/2015 8:05 PM Performed by: Thornell MuleSTUBBLEFIELD, Jartavious Mckimmy G Pre-anesthesia Checklist: Patient identified, Emergency Drugs available, Suction available and Patient being monitored Patient Re-evaluated:Patient Re-evaluated prior to inductionOxygen Delivery Method: Circle System Utilized Preoxygenation: Pre-oxygenation with 100% oxygen Intubation Type: IV induction Ventilation: Mask ventilation without difficulty Laryngoscope Size: Miller and 3 Grade View: Grade I Tube type: Oral Tube size: 7.5 mm Number of attempts: 1 Airway Equipment and Method: Stylet and Oral airway Placement Confirmation: ETT inserted through vocal cords under direct vision,  positive ETCO2 and breath sounds checked- equal and bilateral Secured at: 21 cm Tube secured with: Tape Dental Injury: Teeth and Oropharynx as per pre-operative assessment

## 2015-12-04 NOTE — Anesthesia Postprocedure Evaluation (Signed)
Anesthesia Post Note  Patient: Cameron [name]  Procedure(s) Performed: Procedure(s) (LRB): CYSTOSCOPY WITH RETROGRADE PYELOGRAM/URETERAL STENT PLACEMENT (Left)  Patient location during evaluation: PACU Anesthesia Type: General Level of consciousness: awake and alert Pain management: pain level controlled Vital Signs Assessment: post-procedure vital signs reviewed and stable Respiratory status: spontaneous breathing, nonlabored ventilation, respiratory function stable and patient connected to nasal cannula oxygen Cardiovascular status: blood pressure returned to baseline and stable Postop Assessment: no signs of nausea or vomiting Anesthetic complications: no    Last Vitals:  Filed Vitals:   12/02/15 2115 12/02/15 2130  BP: 118/89 141/88  Pulse: 79 80  Temp: 37.4 C   Resp: 17 16    Last Pain:  Filed Vitals:   12/02/15 2143  PainSc: 0-No pain                 [name]

## 2015-12-05 ENCOUNTER — Other Ambulatory Visit: Payer: Self-pay | Admitting: Urology

## 2015-12-05 ENCOUNTER — Encounter (HOSPITAL_COMMUNITY): Payer: Self-pay | Admitting: Urology

## 2015-12-06 ENCOUNTER — Encounter (HOSPITAL_COMMUNITY): Payer: Self-pay | Admitting: Urology

## 2015-12-07 ENCOUNTER — Other Ambulatory Visit (HOSPITAL_COMMUNITY): Payer: Self-pay | Admitting: Pulmonary Disease

## 2015-12-07 DIAGNOSIS — E0590 Thyrotoxicosis, unspecified without thyrotoxic crisis or storm: Secondary | ICD-10-CM

## 2015-12-12 ENCOUNTER — Encounter (HOSPITAL_COMMUNITY)
Admission: RE | Disposition: A | Discharge status: Home / Self Care | Payer: Self-pay | Source: Ambulatory Visit | Attending: Urology

## 2015-12-12 ENCOUNTER — Ambulatory Visit (HOSPITAL_COMMUNITY)
Admission: RE | Admit: 2015-12-12 | Discharge: 2015-12-12 | Disposition: A | Discharge status: Home / Self Care | Payer: BLUE CROSS/BLUE SHIELD | Source: Ambulatory Visit | Attending: Urology | Admitting: Urology

## 2015-12-12 ENCOUNTER — Other Ambulatory Visit (HOSPITAL_COMMUNITY): Payer: BLUE CROSS/BLUE SHIELD

## 2015-12-12 ENCOUNTER — Ambulatory Visit (HOSPITAL_COMMUNITY): Payer: BLUE CROSS/BLUE SHIELD

## 2015-12-12 ENCOUNTER — Encounter (HOSPITAL_COMMUNITY): Payer: Self-pay | Admitting: *Deleted

## 2015-12-12 DIAGNOSIS — Z87442 Personal history of urinary calculi: Secondary | ICD-10-CM | POA: Diagnosis not present

## 2015-12-12 DIAGNOSIS — N201 Calculus of ureter: Secondary | ICD-10-CM | POA: Diagnosis not present

## 2015-12-12 DIAGNOSIS — Z79891 Long term (current) use of opiate analgesic: Secondary | ICD-10-CM | POA: Insufficient documentation

## 2015-12-12 DIAGNOSIS — Z79899 Other long term (current) drug therapy: Secondary | ICD-10-CM | POA: Insufficient documentation

## 2015-12-12 DIAGNOSIS — G4730 Sleep apnea, unspecified: Secondary | ICD-10-CM | POA: Diagnosis not present

## 2015-12-12 SURGERY — LITHOTRIPSY, ESWL
Anesthesia: LOCAL | Laterality: Left

## 2015-12-12 MED ORDER — DIAZEPAM 5 MG PO TABS
10.0000 mg | ORAL_TABLET | ORAL | Status: AC
  Administered 2015-12-12: 10 mg via ORAL
  Filled 2015-12-12: qty 2

## 2015-12-12 MED ORDER — CIPROFLOXACIN HCL 500 MG PO TABS
500.0000 mg | ORAL_TABLET | ORAL | Status: AC
  Administered 2015-12-12: 500 mg via ORAL
  Filled 2015-12-12: qty 1

## 2015-12-12 MED ORDER — DIPHENHYDRAMINE HCL 25 MG PO CAPS
25.0000 mg | ORAL_CAPSULE | ORAL | Status: AC
  Administered 2015-12-12: 25 mg via ORAL
  Filled 2015-12-12: qty 1

## 2015-12-12 MED ORDER — HYDROMORPHONE HCL 1 MG/ML IJ SOLN
1.0000 mg | Freq: Once | INTRAMUSCULAR | Status: AC
  Administered 2015-12-12: 1 mg via INTRAVENOUS
  Filled 2015-12-12: qty 1

## 2015-12-12 MED ORDER — SODIUM CHLORIDE 0.9 % IV SOLN
INTRAVENOUS | Status: DC
  Administered 2015-12-12: 09:00:00 via INTRAVENOUS

## 2015-12-12 NOTE — Progress Notes (Signed)
Patient back from litho truck stating pain 6/10. Shaking - states he does that when in pain. VSS.Called MD and obtained order for IV pain med.

## 2015-12-12 NOTE — Progress Notes (Signed)
Patient stated pain came down to a 4/10 after Dilaudid. Was 5/10 upon admission today. He stated Dilaudid helped "a little" and wants to go home. Has script for pain meds and he and wife aware of all d/c instructions. Voided. VSS. Escorted to car by Charity fundraiserN.

## 2015-12-12 NOTE — Discharge Instructions (Signed)
Lithotripsy, Care After °Refer to this sheet in the next few weeks. These instructions provide you with information on caring for yourself after your procedure. Your health care provider may also give you more specific instructions. Your treatment has been planned according to current medical practices, but problems sometimes occur. Call your health care provider if you have any problems or questions after your procedure. °WHAT TO EXPECT AFTER THE PROCEDURE  °· Your urine may have a red tinge for a few days after treatment. Blood loss is usually minimal. °· You may have soreness in the back or flank area. This usually goes away after a few days. The procedure can cause blotches or bruises on the back where the pressure wave enters the skin. These marks usually cause only minimal discomfort and should disappear in a short time. °· Stone fragments should begin to pass within 24 hours of treatment. However, a delayed passage is not unusual. °· You may have pain, discomfort, and feel sick to your stomach (nauseated) when the crushed fragments of stone are passed down the tube from the kidney to the bladder. Stone fragments can pass soon after the procedure and may last for up to 4-8 weeks. °· A small number of patients may have severe pain when stone fragments are not able to pass, which leads to an obstruction. °· If your stone is greater than 1 inch (2.5 cm) in diameter or if you have multiple stones that have a combined diameter greater than 1 inch (2.5 cm), you may require more than one treatment. °· If you had a stent placed prior to your procedure, you may experience some discomfort, especially during urination. You may experience the pain or discomfort in your flank or back, or you may experience a sharp pain or discomfort at the base of your penis or in your lower abdomen. The discomfort usually lasts only a few minutes after urinating. °HOME CARE INSTRUCTIONS  °· Rest at home until you feel your energy  improving. °· Only take over-the-counter or prescription medicines for pain, discomfort, or fever as directed by your health care provider. Depending on the type of lithotripsy, you may need to take antibiotics and anti-inflammatory medicines for a few days. °· Drink enough water and fluids to keep your urine clear or pale yellow. This helps "flush" your kidneys. It helps pass any remaining pieces of stone and prevents stones from coming back. °· Most people can resume daily activities within 1-2 days after standard lithotripsy. It can take longer to recover from laser and percutaneous lithotripsy. °· Strain all urine through the provided strainer. Keep all particulate matter and stones for your health care provider to see. The stone may be as small as a grain of salt. It is very important to use the strainer each and every time you pass your urine. Any stones that are found can be sent to a medical lab for examination. °· Visit your health care provider for a follow-up appointment in a few weeks. Your doctor may remove your stent if you have one. Your health care provider will also check to see whether stone particles still remain. °SEEK MEDICAL CARE IF:  °· Your pain is not relieved by medicine. °· You have a lasting nauseous feeling. °· You feel there is too much blood in the urine. °· You develop persistent problems with frequent or painful urination that does not at least partially improve after 2 days following the procedure. °· You have a congested cough. °· You feel   lightheaded. °· You develop a rash or any other signs that might suggest an allergic problem. °· You develop any reaction or side effects to your medicine(s). °SEEK IMMEDIATE MEDICAL CARE IF:  °· You experience severe back or flank pain or both. °· You see nothing but blood when you urinate. °· You cannot pass any urine at all. °· You have a fever or shaking chills. °· You develop shortness of breath, difficulty breathing, or chest pain. °· You  develop vomiting that will not stop after 6-8 hours. °· You have a fainting episode. °  °This information is not intended to replace advice given to you by your health care provider. Make sure you discuss any questions you have with your health care provider. °  °Document Released: 12/30/2007 Document Revised: 08/31/2015 Document Reviewed: 06/25/2013 °Elsevier Interactive Patient Education ©2016 [employer] ° °

## 2015-12-21 ENCOUNTER — Ambulatory Visit (HOSPITAL_COMMUNITY)
Admission: RE | Admit: 2015-12-21 | Discharge: 2015-12-21 | Disposition: A | Discharge status: Home / Self Care | Payer: BLUE CROSS/BLUE SHIELD | Source: Ambulatory Visit | Attending: Pulmonary Disease | Admitting: Pulmonary Disease

## 2015-12-21 ENCOUNTER — Other Ambulatory Visit: Payer: Self-pay | Admitting: Urology

## 2015-12-21 ENCOUNTER — Other Ambulatory Visit (HOSPITAL_COMMUNITY): Payer: Self-pay | Admitting: Urology

## 2015-12-21 ENCOUNTER — Ambulatory Visit (HOSPITAL_COMMUNITY)
Admission: RE | Admit: 2015-12-21 | Discharge: 2015-12-21 | Disposition: A | Discharge status: Home / Self Care | Payer: BLUE CROSS/BLUE SHIELD | Source: Ambulatory Visit | Attending: Urology | Admitting: Urology

## 2015-12-21 ENCOUNTER — Ambulatory Visit (INDEPENDENT_AMBULATORY_CARE_PROVIDER_SITE_OTHER): Payer: BLUE CROSS/BLUE SHIELD | Admitting: Urology

## 2015-12-21 DIAGNOSIS — N200 Calculus of kidney: Secondary | ICD-10-CM

## 2015-12-21 DIAGNOSIS — E0590 Thyrotoxicosis, unspecified without thyrotoxic crisis or storm: Secondary | ICD-10-CM | POA: Insufficient documentation

## 2016-01-04 ENCOUNTER — Ambulatory Visit: Payer: BLUE CROSS/BLUE SHIELD | Admitting: Urology

## 2016-01-11 ENCOUNTER — Ambulatory Visit (INDEPENDENT_AMBULATORY_CARE_PROVIDER_SITE_OTHER): Payer: BLUE CROSS/BLUE SHIELD | Admitting: Urology

## 2016-01-11 DIAGNOSIS — N200 Calculus of kidney: Secondary | ICD-10-CM

## 2016-01-11 DIAGNOSIS — R350 Frequency of micturition: Secondary | ICD-10-CM | POA: Diagnosis not present

## 2016-01-11 DIAGNOSIS — R351 Nocturia: Secondary | ICD-10-CM

## 2016-01-11 DIAGNOSIS — N401 Enlarged prostate with lower urinary tract symptoms: Secondary | ICD-10-CM

## 2016-02-20 ENCOUNTER — Other Ambulatory Visit: Payer: Self-pay | Admitting: Urology

## 2016-02-20 ENCOUNTER — Ambulatory Visit (HOSPITAL_COMMUNITY)
Admission: RE | Admit: 2016-02-20 | Discharge: 2016-02-20 | Disposition: A | Discharge status: Home / Self Care | Payer: BLUE CROSS/BLUE SHIELD | Source: Ambulatory Visit | Attending: Urology | Admitting: Urology

## 2016-02-20 DIAGNOSIS — N200 Calculus of kidney: Secondary | ICD-10-CM

## 2016-02-22 ENCOUNTER — Ambulatory Visit (INDEPENDENT_AMBULATORY_CARE_PROVIDER_SITE_OTHER): Payer: BLUE CROSS/BLUE SHIELD | Admitting: Urology

## 2016-02-22 DIAGNOSIS — R351 Nocturia: Secondary | ICD-10-CM | POA: Diagnosis not present

## 2016-02-22 DIAGNOSIS — N401 Enlarged prostate with lower urinary tract symptoms: Secondary | ICD-10-CM

## 2016-02-22 DIAGNOSIS — N200 Calculus of kidney: Secondary | ICD-10-CM

## 2016-02-24 ENCOUNTER — Other Ambulatory Visit: Payer: Self-pay | Admitting: Urology

## 2016-03-08 ENCOUNTER — Encounter (HOSPITAL_COMMUNITY): Payer: Self-pay

## 2016-03-12 ENCOUNTER — Other Ambulatory Visit (HOSPITAL_COMMUNITY): Payer: Self-pay | Admitting: Endocrinology

## 2016-03-12 DIAGNOSIS — E0590 Thyrotoxicosis, unspecified without thyrotoxic crisis or storm: Secondary | ICD-10-CM

## 2016-03-15 ENCOUNTER — Ambulatory Visit (HOSPITAL_COMMUNITY)
Admission: RE | Admit: 2016-03-15 | Discharge: 2016-03-15 | Disposition: A | Discharge status: Home / Self Care | Payer: BLUE CROSS/BLUE SHIELD | Source: Ambulatory Visit | Attending: Urology | Admitting: Urology

## 2016-03-15 ENCOUNTER — Encounter (HOSPITAL_COMMUNITY)
Admission: RE | Disposition: A | Discharge status: Home / Self Care | Payer: Self-pay | Source: Ambulatory Visit | Attending: Urology

## 2016-03-15 ENCOUNTER — Ambulatory Visit (HOSPITAL_COMMUNITY): Payer: BLUE CROSS/BLUE SHIELD

## 2016-03-15 ENCOUNTER — Encounter (HOSPITAL_COMMUNITY): Payer: Self-pay | Admitting: General Practice

## 2016-03-15 DIAGNOSIS — N200 Calculus of kidney: Secondary | ICD-10-CM | POA: Insufficient documentation

## 2016-03-15 DIAGNOSIS — F17210 Nicotine dependence, cigarettes, uncomplicated: Secondary | ICD-10-CM | POA: Diagnosis not present

## 2016-03-15 DIAGNOSIS — E119 Type 2 diabetes mellitus without complications: Secondary | ICD-10-CM | POA: Insufficient documentation

## 2016-03-15 DIAGNOSIS — G4730 Sleep apnea, unspecified: Secondary | ICD-10-CM | POA: Insufficient documentation

## 2016-03-15 DIAGNOSIS — F17290 Nicotine dependence, other tobacco product, uncomplicated: Secondary | ICD-10-CM | POA: Diagnosis not present

## 2016-03-15 DIAGNOSIS — E0590 Thyrotoxicosis, unspecified without thyrotoxic crisis or storm: Secondary | ICD-10-CM | POA: Insufficient documentation

## 2016-03-15 DIAGNOSIS — Z79899 Other long term (current) drug therapy: Secondary | ICD-10-CM | POA: Diagnosis not present

## 2016-03-15 SURGERY — LITHOTRIPSY, ESWL
Anesthesia: LOCAL | Laterality: Right

## 2016-03-15 MED ORDER — CIPROFLOXACIN HCL 500 MG PO TABS
500.0000 mg | ORAL_TABLET | ORAL | Status: AC
  Administered 2016-03-15: 500 mg via ORAL
  Filled 2016-03-15: qty 1

## 2016-03-15 MED ORDER — DIAZEPAM 5 MG PO TABS
10.0000 mg | ORAL_TABLET | ORAL | Status: AC
  Administered 2016-03-15: 10 mg via ORAL
  Filled 2016-03-15: qty 2

## 2016-03-15 MED ORDER — DIPHENHYDRAMINE HCL 25 MG PO CAPS
25.0000 mg | ORAL_CAPSULE | ORAL | Status: AC
  Administered 2016-03-15: 25 mg via ORAL
  Filled 2016-03-15: qty 1

## 2016-03-15 MED ORDER — SODIUM CHLORIDE 0.9 % IV SOLN
INTRAVENOUS | Status: DC
  Administered 2016-03-15: 09:00:00 via INTRAVENOUS

## 2016-03-15 NOTE — H&P (Signed)
Urology Admission H&P  Chief Complaint: right flank pain  History of Present Illness: [name] is a 59yo with a hx of nephrolithiasis s/p L ESWL who now presents for tretment of his R lower pole calculus. He has mild right flank pain. He denies any LUTS  Past Medical History  Diagnosis Date  . Hypertension     due to kidney stones  . Heart murmur     told as child he had one  . Sleep apnea     has but does not use CPAP  . Hyperthyroidism     has appointment next 12/07/15 to get this assessed.  . Diabetes mellitus without complication (HCC)     elevated A1C  . GERD (gastroesophageal reflux disease)   . Headache     childhood headaches. Does not use migraine medications  . Arthritis     bilateral hips   Past Surgical History  Procedure Laterality Date  . Cystoscopy  1990s    with stone extraction  . Lithotripsy  1990s  . Umbilical hernia repair  late 1990s  . Knee arthroscopy Left 2005  . Esophagogastroduodenoscopy  1990s  . Cardiac catheterization  1990s    mitral valve was abnormal. No other results  . Cystoscopy w/ ureteral stent placement Left 12/02/2015    Procedure: CYSTOSCOPY WITH RETROGRADE PYELOGRAM/URETERAL STENT PLACEMENT;  Surgeon: Malen GauzePatrick L McKenzie, MD;  Location: [facility];  Service: Urology;  Laterality: Left;  . Lithotripsy Left 11/2015    Home Medications:  Prescriptions prior to admission  Medication Sig Dispense Refill Last Dose  . HYDROcodone-acetaminophen (NORCO/VICODIN) 5-325 MG tablet Take 1 tablet by mouth every 6 (six) hours as needed for moderate pain.   03/14/2016 at 1900  . tamsulosin (FLOMAX) 0.4 MG CAPS capsule Take 1 capsule (0.4 mg total) by mouth daily after supper. 30 capsule 0 03/14/2016 at 1900  . traMADol (ULTRAM) 50 MG tablet Take 50-100 mg by mouth every 4 (four) hours as needed for moderate pain.   03/14/2016 at 1800  . Vitamin D, Cholecalciferol, 1000 units CAPS Take 1,000 mg by mouth 2 (two) times daily.   03/14/2016 at 1800  .  ondansetron (ZOFRAN ODT) 4 MG disintegrating tablet Take 1 tablet (4 mg total) by mouth every 8 (eight) hours as needed for nausea or vomiting. 20 tablet 0 has  not taken  . oxyCODONE-acetaminophen (ROXICET) 5-325 MG tablet Take 1 tablet by mouth every 4 (four) hours as needed for severe pain. 30 tablet 0 12/12/2015 at 0600  . phenazopyridine (PYRIDIUM) 100 MG tablet Take 1 tablet (100 mg total) by mouth 3 (three) times daily as needed for pain. 20 tablet 0 12/12/2015 at 0600  . promethazine (PHENERGAN) 25 MG tablet Take 25 mg by mouth every 6 (six) hours as needed for nausea or vomiting.   has not taken any   Allergies:  Allergies  Allergen Reactions  . Demerol [Meperidine] Other (See Comments)    Makes pt hyper, the higher the dose the more hyper pt gets  . Stadol [Butorphanol] Other (See Comments)    Hyper- the higher the dose the more hyper pt gets    History reviewed. No pertinent family history. Social History:  reports that he has been passively smoking Cigarettes and Pipe.  He has smoked for the past 17 years. He does not have any smokeless tobacco history on file. He reports that he drinks alcohol. His drug history is not on file.  Review of Systems  Genitourinary: Positive for flank  pain.  All other systems reviewed and are negative.   Physical Exam:  Vital signs in last 24 hours: Temp:  [97 F (36.1 C)] 97 F (36.1 C) (03/23 0829) Pulse Rate:  [68] 68 (03/23 0829) Resp:  [16] 16 (03/23 0829) BP: (122)/(77) 122/77 mmHg (03/23 0829) SpO2:  [99 %] 99 % (03/23 0829) Weight:  [70.217 kg (154 lb 12.8 oz)] 70.217 kg (154 lb 12.8 oz) (03/23 0829) Physical Exam  Constitutional: He is oriented to person, place, and time. He appears well-developed and well-nourished.  HENT:  Head: Normocephalic and atraumatic.  Eyes: EOM are normal. Pupils are equal, round, and reactive to light.  Neck: Normal range of motion. No thyromegaly present.  Cardiovascular: Normal rate and regular  rhythm.   Respiratory: Effort normal. No respiratory distress.  GI: Soft. He exhibits no distension.  Musculoskeletal: Normal range of motion.  Neurological: He is alert and oriented to person, place, and time.  Skin: Skin is warm and dry.  Psychiatric: He has a normal mood and affect. His behavior is normal. Judgment and thought content normal.    Laboratory Data:  No results found for this or any previous visit (from the past 24 hour(s)). No results found for this or any previous visit (from the past 240 hour(s)). Creatinine: No results for input(s): CREATININE in the last 168 hours. Baseline Creatinine: unknown  Impression/Assessment:  59yo with right lower pole calculus  Plan:  The risks/benefits/alternatives to R ESWL was explained to the patient and he understands and wishes to proceed with surgery  MCKENZIE, PATRICK L 03/15/2016, 10:41 AM

## 2016-03-15 NOTE — Discharge Instructions (Signed)
Lithotripsy, Care After °Refer to this sheet in the next few weeks. These instructions provide you with information on caring for yourself after your procedure. Your health care provider may also give you more specific instructions. Your treatment has been planned according to current medical practices, but problems sometimes occur. Call your health care provider if you have any problems or questions after your procedure. °WHAT TO EXPECT AFTER THE PROCEDURE  °· Your urine may have a red tinge for a few days after treatment. Blood loss is usually minimal. °· You may have soreness in the back or flank area. This usually goes away after a few days. The procedure can cause blotches or bruises on the back where the pressure wave enters the skin. These marks usually cause only minimal discomfort and should disappear in a short time. °· Stone fragments should begin to pass within 24 hours of treatment. However, a delayed passage is not unusual. °· You may have pain, discomfort, and feel sick to your stomach (nauseated) when the crushed fragments of stone are passed down the tube from the kidney to the bladder. Stone fragments can pass soon after the procedure and may last for up to 4-8 weeks. °· A small number of patients may have severe pain when stone fragments are not able to pass, which leads to an obstruction. °· If your stone is greater than 1 inch (2.5 cm) in diameter or if you have multiple stones that have a combined diameter greater than 1 inch (2.5 cm), you may require more than one treatment. °· If you had a stent placed prior to your procedure, you may experience some discomfort, especially during urination. You may experience the pain or discomfort in your flank or back, or you may experience a sharp pain or discomfort at the base of your penis or in your lower abdomen. The discomfort usually lasts only a few minutes after urinating. °HOME CARE INSTRUCTIONS  °· Rest at home until you feel your energy  improving. °· Only take over-the-counter or prescription medicines for pain, discomfort, or fever as directed by your health care provider. Depending on the type of lithotripsy, you may need to take antibiotics and anti-inflammatory medicines for a few days. °· Drink enough water and fluids to keep your urine clear or pale yellow. This helps "flush" your kidneys. It helps pass any remaining pieces of stone and prevents stones from coming back. °· Most people can resume daily activities within 1-2 days after standard lithotripsy. It can take longer to recover from laser and percutaneous lithotripsy. °· Strain all urine through the provided strainer. Keep all particulate matter and stones for your health care provider to see. The stone may be as small as a grain of salt. It is very important to use the strainer each and every time you pass your urine. Any stones that are found can be sent to a medical lab for examination. °· Visit your health care provider for a follow-up appointment in a few weeks. Your doctor may remove your stent if you have one. Your health care provider will also check to see whether stone particles still remain. °SEEK MEDICAL CARE IF:  °· Your pain is not relieved by medicine. °· You have a lasting nauseous feeling. °· You feel there is too much blood in the urine. °· You develop persistent problems with frequent or painful urination that does not at least partially improve after 2 days following the procedure. °· You have a congested cough. °· You feel   lightheaded. °· You develop a rash or any other signs that might suggest an allergic problem. °· You develop any reaction or side effects to your medicine(s). °SEEK IMMEDIATE MEDICAL CARE IF:  °· You experience severe back or flank pain or both. °· You see nothing but blood when you urinate. °· You cannot pass any urine at all. °· You have a fever or shaking chills. °· You develop shortness of breath, difficulty breathing, or chest pain. °· You  develop vomiting that will not stop after 6-8 hours. °· You have a fainting episode. °  °This information is not intended to replace advice given to you by your health care provider. Make sure you discuss any questions you have with your health care provider. °  °Document Released: 12/30/2007 Document Revised: 08/31/2015 Document Reviewed: 06/25/2013 °Elsevier Interactive Patient Education ©2016 [employer] ° °

## 2016-03-19 ENCOUNTER — Encounter (HOSPITAL_COMMUNITY)
Admission: RE | Admit: 2016-03-19 | Discharge: 2016-03-19 | Disposition: A | Discharge status: Home / Self Care | Payer: BLUE CROSS/BLUE SHIELD | Source: Ambulatory Visit | Attending: Endocrinology | Admitting: Endocrinology

## 2016-03-19 ENCOUNTER — Encounter (HOSPITAL_COMMUNITY): Payer: Self-pay

## 2016-03-19 DIAGNOSIS — E0590 Thyrotoxicosis, unspecified without thyrotoxic crisis or storm: Secondary | ICD-10-CM

## 2016-03-19 MED ORDER — SODIUM IODIDE I 131 CAPSULE
9.0000 | Freq: Once | INTRAVENOUS | Status: AC | PRN
  Administered 2016-03-19: 9 via ORAL

## 2016-03-20 ENCOUNTER — Ambulatory Visit (HOSPITAL_COMMUNITY)
Admission: RE | Admit: 2016-03-20 | Discharge: 2016-03-20 | Disposition: A | Discharge status: Home / Self Care | Payer: BLUE CROSS/BLUE SHIELD | Source: Ambulatory Visit | Attending: Endocrinology | Admitting: Endocrinology

## 2016-03-20 DIAGNOSIS — N200 Calculus of kidney: Secondary | ICD-10-CM | POA: Diagnosis not present

## 2016-03-20 MED ORDER — SODIUM PERTECHNETATE TC 99M INJECTION
10.0000 | Freq: Once | INTRAVENOUS | Status: AC | PRN
  Administered 2016-03-20: 10.7 via INTRAVENOUS

## 2016-03-30 ENCOUNTER — Ambulatory Visit (INDEPENDENT_AMBULATORY_CARE_PROVIDER_SITE_OTHER): Payer: BLUE CROSS/BLUE SHIELD | Admitting: Urology

## 2016-03-30 DIAGNOSIS — N200 Calculus of kidney: Secondary | ICD-10-CM

## 2016-03-30 DIAGNOSIS — R351 Nocturia: Secondary | ICD-10-CM | POA: Diagnosis not present

## 2016-04-02 ENCOUNTER — Other Ambulatory Visit: Payer: Self-pay | Admitting: Urology

## 2016-04-02 ENCOUNTER — Ambulatory Visit (HOSPITAL_COMMUNITY)
Admission: RE | Admit: 2016-04-02 | Discharge: 2016-04-02 | Disposition: A | Discharge status: Home / Self Care | Payer: BLUE CROSS/BLUE SHIELD | Source: Ambulatory Visit | Attending: Urology | Admitting: Urology

## 2016-04-02 DIAGNOSIS — N200 Calculus of kidney: Secondary | ICD-10-CM

## 2016-05-28 ENCOUNTER — Other Ambulatory Visit: Payer: Self-pay | Admitting: Urology

## 2016-05-28 ENCOUNTER — Ambulatory Visit (HOSPITAL_COMMUNITY)
Admission: RE | Admit: 2016-05-28 | Discharge: 2016-05-28 | Disposition: A | Discharge status: Home / Self Care | Payer: BLUE CROSS/BLUE SHIELD | Source: Ambulatory Visit | Attending: Urology | Admitting: Urology

## 2016-05-28 DIAGNOSIS — N200 Calculus of kidney: Secondary | ICD-10-CM

## 2016-05-28 DIAGNOSIS — N201 Calculus of ureter: Secondary | ICD-10-CM | POA: Diagnosis present

## 2016-05-30 ENCOUNTER — Ambulatory Visit (INDEPENDENT_AMBULATORY_CARE_PROVIDER_SITE_OTHER): Payer: BLUE CROSS/BLUE SHIELD | Admitting: Urology

## 2016-05-30 DIAGNOSIS — R1084 Generalized abdominal pain: Secondary | ICD-10-CM

## 2016-05-30 DIAGNOSIS — N200 Calculus of kidney: Secondary | ICD-10-CM | POA: Diagnosis not present

## 2016-06-01 ENCOUNTER — Other Ambulatory Visit: Payer: Self-pay | Admitting: Urology

## 2016-06-13 ENCOUNTER — Encounter (HOSPITAL_COMMUNITY): Payer: Self-pay | Admitting: *Deleted

## 2016-06-18 ENCOUNTER — Encounter (HOSPITAL_COMMUNITY)
Admission: RE | Disposition: A | Discharge status: Home / Self Care | Payer: Self-pay | Source: Ambulatory Visit | Attending: Urology

## 2016-06-18 ENCOUNTER — Ambulatory Visit (HOSPITAL_COMMUNITY): Payer: BLUE CROSS/BLUE SHIELD

## 2016-06-18 ENCOUNTER — Ambulatory Visit (HOSPITAL_COMMUNITY)
Admission: RE | Admit: 2016-06-18 | Discharge: 2016-06-18 | Disposition: A | Discharge status: Home / Self Care | Payer: BLUE CROSS/BLUE SHIELD | Source: Ambulatory Visit | Attending: Urology | Admitting: Urology

## 2016-06-18 ENCOUNTER — Encounter (HOSPITAL_COMMUNITY): Payer: Self-pay | Admitting: *Deleted

## 2016-06-18 DIAGNOSIS — F17290 Nicotine dependence, other tobacco product, uncomplicated: Secondary | ICD-10-CM | POA: Diagnosis not present

## 2016-06-18 DIAGNOSIS — Z79899 Other long term (current) drug therapy: Secondary | ICD-10-CM | POA: Insufficient documentation

## 2016-06-18 DIAGNOSIS — E0590 Thyrotoxicosis, unspecified without thyrotoxic crisis or storm: Secondary | ICD-10-CM | POA: Insufficient documentation

## 2016-06-18 DIAGNOSIS — E119 Type 2 diabetes mellitus without complications: Secondary | ICD-10-CM | POA: Diagnosis not present

## 2016-06-18 DIAGNOSIS — F17210 Nicotine dependence, cigarettes, uncomplicated: Secondary | ICD-10-CM | POA: Insufficient documentation

## 2016-06-18 DIAGNOSIS — N200 Calculus of kidney: Secondary | ICD-10-CM | POA: Diagnosis present

## 2016-06-18 DIAGNOSIS — G4730 Sleep apnea, unspecified: Secondary | ICD-10-CM | POA: Diagnosis not present

## 2016-06-18 SURGERY — LITHOTRIPSY, ESWL
Anesthesia: LOCAL | Laterality: Left

## 2016-06-18 MED ORDER — DIAZEPAM 5 MG PO TABS
10.0000 mg | ORAL_TABLET | ORAL | Status: AC
  Administered 2016-06-18: 10 mg via ORAL
  Filled 2016-06-18: qty 2

## 2016-06-18 MED ORDER — SODIUM CHLORIDE 0.9 % IV SOLN
INTRAVENOUS | Status: DC
  Administered 2016-06-18: 15:00:00 via INTRAVENOUS

## 2016-06-18 MED ORDER — CIPROFLOXACIN HCL 500 MG PO TABS
500.0000 mg | ORAL_TABLET | ORAL | Status: AC
  Administered 2016-06-18: 500 mg via ORAL
  Filled 2016-06-18: qty 1

## 2016-06-18 MED ORDER — DIPHENHYDRAMINE HCL 25 MG PO CAPS
25.0000 mg | ORAL_CAPSULE | ORAL | Status: AC
  Administered 2016-06-18: 25 mg via ORAL
  Filled 2016-06-18: qty 1

## 2016-06-18 NOTE — Discharge Instructions (Signed)
Lithotripsy, Care After °Refer to this sheet in the next few weeks. These instructions provide you with information on caring for yourself after your procedure. Your health care provider may also give you more specific instructions. Your treatment has been planned according to current medical practices, but problems sometimes occur. Call your health care provider if you have any problems or questions after your procedure. °WHAT TO EXPECT AFTER THE PROCEDURE  °· Your urine may have a red tinge for a few days after treatment. Blood loss is usually minimal. °· You may have soreness in the back or flank area. This usually goes away after a few days. The procedure can cause blotches or bruises on the back where the pressure wave enters the skin. These marks usually cause only minimal discomfort and should disappear in a short time. °· Stone fragments should begin to pass within 24 hours of treatment. However, a delayed passage is not unusual. °· You may have pain, discomfort, and feel sick to your stomach (nauseated) when the crushed fragments of stone are passed down the tube from the kidney to the bladder. Stone fragments can pass soon after the procedure and may last for up to 4-8 weeks. °· A small number of patients may have severe pain when stone fragments are not able to pass, which leads to an obstruction. °· If your stone is greater than 1 inch (2.5 cm) in diameter or if you have multiple stones that have a combined diameter greater than 1 inch (2.5 cm), you may require more than one treatment. °· If you had a stent placed prior to your procedure, you may experience some discomfort, especially during urination. You may experience the pain or discomfort in your flank or back, or you may experience a sharp pain or discomfort at the base of your penis or in your lower abdomen. The discomfort usually lasts only a few minutes after urinating. °HOME CARE INSTRUCTIONS  °· Rest at home until you feel your energy  improving. °· Only take over-the-counter or prescription medicines for pain, discomfort, or fever as directed by your health care provider. Depending on the type of lithotripsy, you may need to take antibiotics and anti-inflammatory medicines for a few days. °· Drink enough water and fluids to keep your urine clear or pale yellow. This helps "flush" your kidneys. It helps pass any remaining pieces of stone and prevents stones from coming back. °· Most people can resume daily activities within 1-2 days after standard lithotripsy. It can take longer to recover from laser and percutaneous lithotripsy. °· Strain all urine through the provided strainer. Keep all particulate matter and stones for your health care provider to see. The stone may be as small as a grain of salt. It is very important to use the strainer each and every time you pass your urine. Any stones that are found can be sent to a medical lab for examination. °· Visit your health care provider for a follow-up appointment in a few weeks. Your doctor may remove your stent if you have one. Your health care provider will also check to see whether stone particles still remain. °SEEK MEDICAL CARE IF:  °· Your pain is not relieved by medicine. °· You have a lasting nauseous feeling. °· You feel there is too much blood in the urine. °· You develop persistent problems with frequent or painful urination that does not at least partially improve after 2 days following the procedure. °· You have a congested cough. °· You feel   lightheaded. °· You develop a rash or any other signs that might suggest an allergic problem. °· You develop any reaction or side effects to your medicine(s). °SEEK IMMEDIATE MEDICAL CARE IF:  °· You experience severe back or flank pain or both. °· You see nothing but blood when you urinate. °· You cannot pass any urine at all. °· You have a fever or shaking chills. °· You develop shortness of breath, difficulty breathing, or chest pain. °· You  develop vomiting that will not stop after 6-8 hours. °· You have a fainting episode. °  °This information is not intended to replace advice given to you by your health care provider. Make sure you discuss any questions you have with your health care provider. °  °Document Released: 12/30/2007 Document Revised: 08/31/2015 Document Reviewed: 06/25/2013 °Elsevier Interactive Patient Education ©2016 [employer]    ° ° ° °                                                                                                           Moderate Conscious Sedation, Adult, Care After °Refer to this sheet in the next few weeks. These instructions provide you with information on caring for yourself after your procedure. Your health care provider may also give you more specific instructions. Your treatment has been planned according to current medical practices, but problems sometimes occur. Call your health care provider if you have any problems or questions after your procedure. °WHAT TO EXPECT AFTER THE PROCEDURE  °After your procedure: °· You may feel sleepy, clumsy, and have poor balance for several hours. °· Vomiting may occur if you eat too soon after the procedure. °HOME CARE INSTRUCTIONS °· Do not participate in any activities where you could become injured for at least 24 hours. Do not: °¨ Drive. °¨ Swim. °¨ Ride a bicycle. °¨ Operate heavy machinery. °¨ Cook. °¨ Use power tools. °¨ Climb ladders. °¨ Work from a high place. °· Do not make important decisions or sign legal documents until you are improved. °· If you vomit, drink water, juice, or soup when you can drink without vomiting. Make sure you have little or no nausea before eating solid foods. °· Only take over-the-counter or prescription medicines for pain, discomfort, or fever as directed by your health care provider. °· Make sure you and your family fully understand everything about the medicines given to you, including what side effects may occur. °· You should  not drink alcohol, take sleeping pills, or take medicines that cause drowsiness for at least 24 hours. °· If you smoke, do not smoke without supervision. °· If you are feeling better, you may resume normal activities 24 hours after you were sedated. °· Keep all appointments with your health care provider. °SEEK MEDICAL CARE IF: °· Your skin is pale or bluish in color. °· You continue to feel nauseous or vomit. °· Your pain is getting worse and is not helped by medicine. °· You have bleeding or swelling. °· You are still sleepy or feeling clumsy after 24 hours. °SEEK   IMMEDIATE MEDICAL CARE IF: °· You develop a rash. °· You have difficulty breathing. °· You develop any type of allergic problem. °· You have a fever. °MAKE SURE YOU: °· Understand these instructions. °· Will watch your condition. °· Will get help right away if you are not doing well or get worse. °  °This information is not intended to replace advice given to you by your health care provider. Make sure you discuss any questions you have with your health care provider. °  °Document Released: 09/30/2013 Document Revised: 12/31/2014 Document Reviewed: 09/30/2013 °Elsevier Interactive Patient Education ©2016 [employer]. ° °

## 2016-06-19 NOTE — H&P (Signed)
Urology Admission H&P  Chief Complaint: left flank pain  History of Present Illness: [name] is 60yo with a history of nephrolithiasis with a left lower pole calculus and persistent left flank pain  Past Medical History  Diagnosis Date  . Hypertension     due to kidney stones  . Heart murmur     told as child he had one  . Sleep apnea     has but does not use CPAP  . Hyperthyroidism     has appointment next 12/07/15 to get this assessed.  . Diabetes mellitus without complication (HCC)     elevated A1C  . GERD (gastroesophageal reflux disease)   . Headache     childhood headaches. Does not use migraine medications  . Arthritis     bilateral hips   Past Surgical History  Procedure Laterality Date  . Cystoscopy  1990s    with stone extraction  . Lithotripsy  1990s  . Umbilical hernia repair  late 1990s  . Knee arthroscopy Left 2005  . Esophagogastroduodenoscopy  1990s  . Cardiac catheterization  1990s    mitral valve was abnormal. No other results  . Cystoscopy w/ ureteral stent placement Left 12/02/2015    Procedure: CYSTOSCOPY WITH RETROGRADE PYELOGRAM/URETERAL STENT PLACEMENT;  Surgeon: Malen GauzePatrick L Javar Eshbach, MD;  Location: [facility];  Service: Urology;  Laterality: Left;  . Lithotripsy Left 11/2015    Home Medications:  No prescriptions prior to admission   Allergies:  Allergies  Allergen Reactions  . Demerol [Meperidine] Other (See Comments)    Makes pt hyper, the higher the dose the more hyper pt gets  . Stadol [Butorphanol] Other (See Comments)    Hyper- the higher the dose the more hyper pt gets    History reviewed. No pertinent family history. Social History:  reports that he has been passively smoking Cigarettes and Pipe.  He has smoked for the past 17 years. He does not have any smokeless tobacco history on file. He reports that he drinks alcohol. His drug history is not on file.  Review of Systems  Genitourinary: Positive for flank pain.  All other systems  reviewed and are negative.   Physical Exam:  Vital signs in last 24 hours: Temp:  [97.5 F (36.4 C)-97.6 F (36.4 C)] 97.6 F (36.4 C) (06/26 1838) Pulse Rate:  [52-62] 54 (06/26 1930) Resp:  [16-18] 18 (06/26 1930) BP: (103-132)/(89-91) 103/89 mmHg (06/26 1930) SpO2:  [96 %-98 %] 98 % (06/26 1930) Weight:  [74.56 kg (164 lb 6 oz)] 74.56 kg (164 lb 6 oz) (06/26 1440) Physical Exam  Constitutional: He is oriented to person, place, and time. He appears well-developed and well-nourished.  HENT:  Head: Normocephalic and atraumatic.  Eyes: EOM are normal. Pupils are equal, round, and reactive to light.  Neck: Normal range of motion. No thyromegaly present.  Cardiovascular: Normal rate and regular rhythm.   Respiratory: Effort normal. No respiratory distress.  GI: Soft. He exhibits no distension and no mass. There is no tenderness. There is no rebound and no guarding.  Musculoskeletal: Normal range of motion.  Neurological: He is alert and oriented to person, place, and time.  Skin: Skin is warm and dry.  Psychiatric: He has a normal mood and affect. His behavior is normal. Judgment and thought content normal.    Laboratory Data:  No results found for this or any previous visit (from the past 24 hour(s)). No results found for this or any previous visit (from the past 240  hour(s)). Creatinine: No results for input(s): CREATININE in the last 168 hours. Baseline Creatinine: unknwon  Impression/Assessment:  60yo with left lower pole calculus  Plan:  The risks/benefits/alternatives to left ESWl was explained to the patient and he understands and wishes to proceed with surgery  Wilkie AyePatrick Saia Derossett 06/19/2016, 9:45 AM

## 2016-07-03 ENCOUNTER — Other Ambulatory Visit: Payer: Self-pay | Admitting: Urology

## 2016-07-03 ENCOUNTER — Ambulatory Visit (HOSPITAL_COMMUNITY)
Admission: RE | Admit: 2016-07-03 | Discharge: 2016-07-03 | Disposition: A | Discharge status: Home / Self Care | Payer: BLUE CROSS/BLUE SHIELD | Source: Ambulatory Visit | Attending: Urology | Admitting: Urology

## 2016-07-03 DIAGNOSIS — N202 Calculus of kidney with calculus of ureter: Secondary | ICD-10-CM | POA: Insufficient documentation

## 2016-07-03 DIAGNOSIS — N201 Calculus of ureter: Secondary | ICD-10-CM | POA: Diagnosis present

## 2016-07-04 ENCOUNTER — Ambulatory Visit (INDEPENDENT_AMBULATORY_CARE_PROVIDER_SITE_OTHER): Payer: Self-pay | Admitting: Urology

## 2016-07-04 DIAGNOSIS — N200 Calculus of kidney: Secondary | ICD-10-CM

## 2016-07-04 DIAGNOSIS — Z9889 Other specified postprocedural states: Secondary | ICD-10-CM

## 2016-08-07 ENCOUNTER — Ambulatory Visit (HOSPITAL_COMMUNITY)
Admission: RE | Admit: 2016-08-07 | Discharge: 2016-08-07 | Disposition: A | Discharge status: Home / Self Care | Payer: BLUE CROSS/BLUE SHIELD | Source: Ambulatory Visit | Attending: Urology | Admitting: Urology

## 2016-08-07 ENCOUNTER — Other Ambulatory Visit (HOSPITAL_COMMUNITY): Payer: Self-pay | Admitting: Urology

## 2016-08-07 DIAGNOSIS — N200 Calculus of kidney: Secondary | ICD-10-CM | POA: Insufficient documentation

## 2016-08-08 ENCOUNTER — Ambulatory Visit (INDEPENDENT_AMBULATORY_CARE_PROVIDER_SITE_OTHER): Payer: BLUE CROSS/BLUE SHIELD | Admitting: Urology

## 2016-08-08 DIAGNOSIS — Z9889 Other specified postprocedural states: Secondary | ICD-10-CM | POA: Diagnosis not present

## 2016-08-08 DIAGNOSIS — N200 Calculus of kidney: Secondary | ICD-10-CM

## 2016-10-22 ENCOUNTER — Encounter: Payer: Self-pay | Admitting: Cardiology

## 2016-10-22 NOTE — Progress Notes (Addendum)
Cardiology Office Note  Date: 10/23/2016   ID: Cameron [name], DOB 08-03-1956, MRN [id number]  PCP: Fredirick MaudlinHAWKINS,EDWARD L, MD  Referring provider: Adrian PrinceStephen South, MD Consulting Cardiologist: Nona DellSamuel McDowell, MD   Chief Complaint  Patient presents with  . Recurrent palpitations    History of Present Illness: Cameron [name] is a 60 y.o. male referred for cardiology consultation by Dr. Evlyn KannerSouth. I reviewed the provided records. He was seen for an office visit earlier in October, reported an episode of heart fluttering at that time that was transient. ECG showed sinus rhythm, but there was concern that he may have had paroxysmal atrial fibrillation. He was started on Xarelto 20 mg daily.  He is here today with his wife. He states that since September he has been having recurrent episodes of a feeling of rapid heart fluttering associated with anxiety and subsequently a feeling of being "wiped out." These episodes tend to last for several minutes without obvious precipitant. He has been very worried about these events. He thought that work stress may have been initially related, but this does not appear to be the case more recently. He does not have any documented history of cardiac arrhythmia. He does have long-standing OSA, noncompliant for many years with CPAP, although reportedly using CPAP regularly since February. No definite history of hypertension, cardiomyopathy, or ischemic heart disease. He does have thyroid disease but recent TSH was in normal range.  Based on available information CHADSVASC score would be 1 in the setting of impaired glucose tolerance. Annual stroke risk would be approximately 1.2% on aspirin versus 0.5% on Xarelto.  Past Medical History:  Diagnosis Date  . Arthritis   . GERD (gastroesophageal reflux disease)   . Headache   . History of heart murmur in childhood   . Hyperthyroidism   . Impaired glucose tolerance   . Nephrolithiasis   . Sleep apnea    On CPAP     Past Surgical History:  Procedure Laterality Date  . CARDIAC CATHETERIZATION  1990s  . CYSTOSCOPY  1990s   With stone extraction  . CYSTOSCOPY W/ URETERAL STENT PLACEMENT Left 12/02/2015   Procedure: CYSTOSCOPY WITH RETROGRADE PYELOGRAM/URETERAL STENT PLACEMENT;  Surgeon: Malen GauzePatrick L McKenzie, MD;  Location: [facility];  Service: Urology;  Laterality: Left;  . ESOPHAGOGASTRODUODENOSCOPY  1990s  . KNEE ARTHROSCOPY Left 2005  . LITHOTRIPSY  1990s  . LITHOTRIPSY Left 11/2015  . UMBILICAL HERNIA REPAIR  late 1990s    Current Outpatient Prescriptions  Medication Sig Dispense Refill  . HYDROcodone-acetaminophen (NORCO/VICODIN) 5-325 MG tablet Take 1 tablet by mouth every 6 (six) hours as needed for moderate pain.    . methimazole (TAPAZOLE) 5 MG tablet Take 5 mg by mouth daily.    . ondansetron (ZOFRAN ODT) 4 MG disintegrating tablet Take 1 tablet (4 mg total) by mouth every 8 (eight) hours as needed for nausea or vomiting. 20 tablet 0  . promethazine (PHENERGAN) 25 MG tablet Take 25 mg by mouth every 6 (six) hours as needed for nausea or vomiting.    . rivaroxaban (XARELTO) 20 MG TABS tablet Take 20 mg by mouth daily with supper.    . tamsulosin (FLOMAX) 0.4 MG CAPS capsule Take 1 capsule (0.4 mg total) by mouth daily after supper. 30 capsule 0  . traMADol (ULTRAM) 50 MG tablet Take 50-100 mg by mouth every 4 (four) hours as needed for moderate pain.    . Vitamin D, Cholecalciferol, 1000 units CAPS Take 1,000 mg by mouth 2 (  two) times daily.     No current facility-administered medications for this visit.    Allergies:  Demerol [meperidine] and Stadol [butorphanol]   Social History: The patient  reports that he is a non-smoker but has been exposed to tobacco smoke. He has been exposed to tobacco smoke for the past 17.00 years. He has never used smokeless tobacco. He reports that he drinks alcohol.   Family History: The patient's family history includes Breast cancer in his maternal  grandmother; Cancer in his father; Depression in his mother; Heart disease in his maternal grandfather; Hyperlipidemia in his mother; Kidney disease in his father.   ROS:  Please see the history of present illness. Otherwise, complete review of systems is positive for none.  All other systems are reviewed and negative.   Physical Exam: VS:  BP 130/84 (BP Location: Right Arm)   Pulse 70   Ht 5\' 5"  (1.651 m)   Wt 174 lb (78.9 kg)   SpO2 97%   BMI 28.96 kg/m , BMI Body mass index is 28.96 kg/m.  Wt Readings from Last 3 Encounters:  10/23/16 174 lb (78.9 kg)  06/18/16 164 lb 6 oz (74.6 kg)  03/15/16 154 lb 12.8 oz (70.2 kg)    General: Patient appears comfortable at rest. HEENT: Conjunctiva and lids normal, oropharynx clear. Neck: Supple, no elevated JVP or carotid bruits, no thyromegaly. Lungs: Clear to auscultation, nonlabored breathing at rest. Cardiac: Regular rate and rhythm, soft S4, soft systolic murmur, no pericardial rub. Abdomen: Soft, nontender, bowel sounds present, no guarding or rebound. Extremities: No pitting edema, distal pulses 2+. Skin: Warm and dry. Musculoskeletal: No kyphosis. Neuropsychiatric: Alert and oriented x3, affect grossly appropriate.  ECG: I personally reviewed the tracing from 09/27/2016 which showed normal sinus rhythm with left anterior fascicular block.  Recent Labwork:  October 2017: TSH 3.26, free T4 1.0, T3 116, cholesterol 169, HDL 56, LDL 94, TG 92, HgbA1C 5.5, ALT 16, AST 14, Hgb 15.7  Other Studies Reviewed Today:  Adenosine Myoview 03/18/2006: Findings:   SPECT:  There is normal perfusion. Wall Motion:  There is normal wall motion and thickening. Ejection Fraction:  Calculated QGC ejection fraction is 71 percent with an end-diastolic volume 100 cc and an end-systolic volume of 29 cc. IMPRESSION:   1.   No inducible or reversible ischemia. 2.   Normal wall motion and ejection fraction of 71 percent.  Assessment and Plan:  1.  Recurring palpitations described as a rapid fluttering sensation. Paroxysmal atrial fibrillation has been considered as a possible diagnosis, although not confirmed. At this time I have recommended outpatient cardiac monitoring to try to objectively document what he is experiencing to help better guide treatment options. We will also obtain echocardiogram for cardiac structural assessment.  2. No history of obstructive CAD or cardiomyopathy. We will obtain his old cardiac catheterization report. He had a nonischemic Myoview study approximately 10 years ago as well.  3. Reported history of impaired glucose tolerance. Hemoglobin A1c was 5.5 recently however. He is not on specific medical therapy.  4. Hyperthyroidism, controlled on medical therapy and followed by Dr. Evlyn KannerSouth.  5. History of OSA, reporting compliance with CPAP recently. We did discuss the relationship between OSA and cardiac arrhythmias.  Current medicines were reviewed with the patient today.   Orders Placed This Encounter  Procedures  . Cardiac event monitor  . ECHOCARDIOGRAM COMPLETE    Disposition: Follow-up in the next few weeks.  Signed, Jonelle SidleSamuel G. McDowell, MD, [facility] CenterFACC 10/23/2016 9:42  AM    [facility] at [facility] [address], [place], [place] [address] Phone: [phone]; Fax: [phone]

## 2016-10-23 ENCOUNTER — Ambulatory Visit (INDEPENDENT_AMBULATORY_CARE_PROVIDER_SITE_OTHER): Payer: BLUE CROSS/BLUE SHIELD | Admitting: Cardiology

## 2016-10-23 ENCOUNTER — Encounter: Payer: Self-pay | Admitting: Cardiology

## 2016-10-23 VITALS — BP 130/84 | HR 70 | Ht 65.0 in | Wt 174.0 lb

## 2016-10-23 DIAGNOSIS — R002 Palpitations: Secondary | ICD-10-CM | POA: Diagnosis not present

## 2016-10-23 DIAGNOSIS — R7302 Impaired glucose tolerance (oral): Secondary | ICD-10-CM | POA: Diagnosis not present

## 2016-10-23 DIAGNOSIS — Z9189 Other specified personal risk factors, not elsewhere classified: Secondary | ICD-10-CM

## 2016-10-23 DIAGNOSIS — E0590 Thyrotoxicosis, unspecified without thyrotoxic crisis or storm: Secondary | ICD-10-CM | POA: Diagnosis not present

## 2016-10-23 NOTE — Patient Instructions (Signed)
Your physician recommends that you schedule a follow-up appointment in: 3 weeks, Dr.McDowell   Your physician recommends that you continue on your current medications as directed. Please refer to the Current Medication list given to you today.    Your physician has requested that you have an echocardiogram. Echocardiography is a painless test that uses sound waves to create images of your heart. It provides your doctor with information about the size and shape of your heart and how well your heart's chambers and valves are working. This procedure takes approximately one hour. There are no restrictions for this procedure.     Your physician has recommended that you wear an event monitor. Event monitors are medical devices that record the heart's electrical activity. Doctors most often us these monitors to diagnose arrhythmias. Arrhythmias are problems with the speed or rhythm of the heartbeat. The monitor is a small, portable device. You can wear one while you do your normal daily activities. This is usually used to diagnose what is causing palpitations/syncope (passing out).       Thank you for choosing [facility] !

## 2016-11-02 ENCOUNTER — Ambulatory Visit (INDEPENDENT_AMBULATORY_CARE_PROVIDER_SITE_OTHER): Payer: BLUE CROSS/BLUE SHIELD

## 2016-11-02 DIAGNOSIS — R002 Palpitations: Secondary | ICD-10-CM

## 2016-11-14 ENCOUNTER — Ambulatory Visit (HOSPITAL_COMMUNITY)
Admission: RE | Admit: 2016-11-14 | Discharge: 2016-11-14 | Disposition: A | Discharge status: Home / Self Care | Payer: BLUE CROSS/BLUE SHIELD | Source: Ambulatory Visit | Attending: Cardiology | Admitting: Cardiology

## 2016-11-14 DIAGNOSIS — I77810 Thoracic aortic ectasia: Secondary | ICD-10-CM | POA: Diagnosis not present

## 2016-11-14 DIAGNOSIS — R002 Palpitations: Secondary | ICD-10-CM | POA: Diagnosis not present

## 2016-11-14 DIAGNOSIS — I352 Nonrheumatic aortic (valve) stenosis with insufficiency: Secondary | ICD-10-CM | POA: Insufficient documentation

## 2016-11-14 DIAGNOSIS — I340 Nonrheumatic mitral (valve) insufficiency: Secondary | ICD-10-CM | POA: Insufficient documentation

## 2016-11-14 DIAGNOSIS — I517 Cardiomegaly: Secondary | ICD-10-CM | POA: Diagnosis not present

## 2016-11-14 DIAGNOSIS — I5189 Other ill-defined heart diseases: Secondary | ICD-10-CM | POA: Diagnosis not present

## 2016-11-14 LAB — ECHOCARDIOGRAM COMPLETE
AV Area VTI index: 0.64 cm2/m2
AV Area VTI: 1.05 cm2
AV Mean grad: 14 mmHg
AV Peak grad: 26 mmHg
AV area mean vel ind: 0.55 cm2/m2
AV pk vel: 255 cm/s
AVAREAMEANV: 1.05 cm2
AVCELMEANRAT: 0.33
Ao pk vel: 0.34 m/s
CHL CUP AV PEAK INDEX: 0.55
CHL CUP AV VEL: 1.24
CHL CUP DOP CALC LVOT VTI: 20.8 cm
CHL CUP MV DEC (S): 268
CHL CUP RV SYS PRESS: 21 mmHg
CHL CUP TV REG PEAK VELOCITY: 214 cm/s
DOP CAL AO MEAN VELOCITY: 169 cm/s
E decel time: 268 msec
E/e' ratio: 9.1
FS: 38 % (ref 28–44)
IVS/LV PW RATIO, ED: 0.95
LA diam end sys: 44 mm
LA vol A4C: 51.1 ml
LA vol index: 22.5 mL/m2
LADIAMINDEX: 2.29 cm/m2
LASIZE: 44 mm
LAVOL: 43.3 mL
LDCA: 3.14 cm2
LV E/e' medial: 9.1
LV E/e'average: 9.1
LV SIMPSON'S DISK: 63
LV dias vol: 80 mL (ref 62–150)
LV e' LATERAL: 9.79 cm/s
LV sys vol index: 15 mL/m2
LV sys vol: 30 mL (ref 21–61)
LVDIAVOLIN: 41 mL/m2
LVOT SV: 65 mL
LVOT peak grad rest: 3 mmHg
LVOT peak vel: 85.5 cm/s
LVOTD: 20 mm
LVOTVTI: 0.39 cm
Lateral S' vel: 10 cm/s
MV Peak grad: 3 mmHg
MVPKAVEL: 108 m/s
MVPKEVEL: 89.1 m/s
P 1/2 time: 593 ms
PW: 12.8 mm — AB (ref 0.6–1.1)
RV TAPSE: 17.8 mm
Stroke v: 50 ml
TDI e' lateral: 9.79
TDI e' medial: 5.66
TR max vel: 214 cm/s
VTI: 52.7 cm
Valve area index: 0.64
Valve area: 1.24 cm2

## 2016-11-14 NOTE — Progress Notes (Signed)
*  PRELIMINARY RESULTS* Echocardiogram 2D Echocardiogram has been performed.  Stacey DrainWhite, Fantasia Jinkins J 11/14/2016, 1:53 PM

## 2016-11-20 NOTE — Progress Notes (Signed)
Cardiology Office Note  Date: 11/21/2016   ID: Cameron [name], DOB [birth year], MRN [id number]  PCP: Fredirick MaudlinHAWKINS,EDWARD L, MD  Primary Cardiologist: Nona DellSamuel Fred Franzen, MD   Chief Complaint  Patient presents with  . Follow-up cardiac testing    History of Present Illness: Cameron [name] is a 60 y.o. male presenting for a follow-up visit. He was seen recently in consultation in late October.He is here today with his wife for a follow-up visit. He tells me that he had a significant episode of palpitations and shortness of breath the day before he put his monitor on. Had a lesser episode that was caught while he was wearing an however. Cardiac monitor showed sinus rhythm with an episode of apparent regular SVT associated with aberrant conduction and retrograde P waves. There was no definite atrial fibrillation. I have asked EP to look at the tracing to see whether this might be something that could be approached by ablation.  Recent echocardiogram is detailed below. LVEF normal range and aortic valve bicuspid with evidence of moderate aortic stenosis. Ascending aorta also mildly dilated. We discussed these results in detail. Also went over natural history of aortic stenosis and need for surveillance monitoring including a chest CTA to better evaluate his aorta.  Reviewed his medications as outlined below.  Past Medical History:  Diagnosis Date  . Arthritis   . GERD (gastroesophageal reflux disease)   . Headache   . History of heart murmur in childhood   . Hyperthyroidism   . Impaired glucose tolerance   . Nephrolithiasis   . Sleep apnea    On CPAP    Past Surgical History:  Procedure Laterality Date  . CARDIAC CATHETERIZATION  1990s  . CYSTOSCOPY  1990s   With stone extraction  . CYSTOSCOPY W/ URETERAL STENT PLACEMENT Left 12/02/2015   Procedure: CYSTOSCOPY WITH RETROGRADE PYELOGRAM/URETERAL STENT PLACEMENT;  Surgeon: Malen GauzePatrick L McKenzie, MD;  Location: [facility];  Service: Urology;   Laterality: Left;  . ESOPHAGOGASTRODUODENOSCOPY  1990s  . KNEE ARTHROSCOPY Left 2005  . LITHOTRIPSY  1990s  . LITHOTRIPSY Left 11/2015  . UMBILICAL HERNIA REPAIR  late 1990s    Current Outpatient Prescriptions  Medication Sig Dispense Refill  . HYDROcodone-acetaminophen (NORCO/VICODIN) 5-325 MG tablet Take 1 tablet by mouth every 6 (six) hours as needed for moderate pain.    . methimazole (TAPAZOLE) 5 MG tablet Take 5 mg by mouth daily.    . ondansetron (ZOFRAN ODT) 4 MG disintegrating tablet Take 1 tablet (4 mg total) by mouth every 8 (eight) hours as needed for nausea or vomiting. 20 tablet 0  . promethazine (PHENERGAN) 25 MG tablet Take 25 mg by mouth every 6 (six) hours as needed for nausea or vomiting.    . rivaroxaban (XARELTO) 20 MG TABS tablet Take 20 mg by mouth daily with supper.    . tamsulosin (FLOMAX) 0.4 MG CAPS capsule Take 1 capsule (0.4 mg total) by mouth daily after supper. 30 capsule 0  . traMADol (ULTRAM) 50 MG tablet Take 50-100 mg by mouth every 4 (four) hours as needed for moderate pain.    . Vitamin D, Cholecalciferol, 1000 units CAPS Take 1,000 mg by mouth 2 (two) times daily.     No current facility-administered medications for this visit.    Allergies:  Demerol [meperidine] and Stadol [butorphanol]   Social History: The patient  reports that he is a non-smoker but has been exposed to tobacco smoke. He has been exposed to tobacco smoke for  the past 17.00 years. He has never used smokeless tobacco. He reports that he drinks alcohol.   ROS:  Please see the history of present illness. Otherwise, complete review of systems is positive for none.  All other systems are reviewed and negative.   Physical Exam: VS:  BP (!) 140/98   Pulse 73   Ht 5\' 5"  (1.651 m)   Wt 175 lb (79.4 kg)   SpO2 97%   BMI 29.12 kg/m , BMI Body mass index is 29.12 kg/m.  Wt Readings from Last 3 Encounters:  11/21/16 175 lb (79.4 kg)  10/23/16 174 lb (78.9 kg)  06/18/16 164 lb 6 oz  (74.6 kg)    General: Patient appears comfortable at rest. HEENT: Conjunctiva and lids normal, oropharynx clear. Neck: Supple, no elevated JVP or carotid bruits, no thyromegaly. Lungs: Clear to auscultation, nonlabored breathing at rest. Cardiac: Regular rate and rhythm, soft S4, soft systolic murmur, no pericardial rub. Abdomen: Soft, nontender, bowel sounds present, no guarding or rebound. Extremities: No pitting edema, distal pulses 2+. Skin: Warm and dry. Musculoskeletal: No kyphosis. Neuropsychiatric: Alert and oriented x3, affect grossly appropriate.  ECG: I personally reviewed the tracing from 09/27/2016 which showed sinus rhythm with left anterior fascicular block.  Recent Labwork:  October 2017: TSH 3.26, free T4 1.0, T3 116, cholesterol 169, HDL 56, LDL 94, TG 92, HgbA1C 5.5, ALT 16, AST 14, Hgb 15.7  Other Studies Reviewed Today:  Echocardiogram 11/14/2016: Study Conclusions  - Left ventricle: The cavity size was normal. Wall thickness was   increased in a pattern of mild LVH. Systolic function was normal.   The estimated ejection fraction was in the range of 55% to 60%.   Wall motion was normal; there were no regional wall motion   abnormalities. Doppler parameters are consistent with abnormal   left ventricular relaxation (grade 1 diastolic dysfunction). - Aortic valve: Mildly calcified annulus. Possibly bicuspid;   moderately calcified leaflets. There was moderate stenosis. There   was trivial regurgitation. Mean gradient (S): 14 mm Hg. Peak   gradient (S): 26 mm Hg. VTI ratio of LVOT to aortic valve: 0.39.   Valve area (VTI): 1.24 cm^2. - Aortic root: The aortic root was mildly dilated. - Ascending aorta: The ascending aorta was mildly dilated. - Mitral valve: There was trivial regurgitation. - Right atrium: Central venous pressure (est): 3 mm Hg. - Tricuspid valve: There was trivial regurgitation. - Pulmonary arteries: PA peak pressure: 21 mm Hg (S). -  Pericardium, extracardiac: There was no pericardial effusion.  Impressions:  - Mild LVH with LVEF 55-60% and grade 1 diastolic dysfunction.   Trivial mitral regurgitation. Aortic valve looks to be bicuspid   and moderately calcified with evidence of overall moderate aortic   stenosis and trivial aortic regurgitation as outlined above.   Aortic root and ascending aorta are mildly dilated. Trivial   tricuspid regurgitation with PASP estimated 21 mmHg.  Assessment and Plan:  1. PSVT as outlined above, no definitive atrial fibrillation however. I have asked EP to review the strips to see if this might be something that is approached via ablation. LVEF is normal by echocardiogram.  2. Bicuspid aortic valve with moderate aortic stenosis and trivial aortic regurgitation. We discussed need for surveillance over time. We will arrange a chest CTA to better evaluate the ascending aortic dilatation.  3. Hyperthyroidism, on Tapazole, followed by Dr. Evlyn Kanner.  4. Obstructive sleep apnea, on CPAP.  Current medicines were reviewed with the patient today.  Orders Placed This Encounter  Procedures  . CT Angio Chest/Abd/Pel for Dissection W and/or W/WO    Disposition: Await EP recommendations and chest CT results, will then schedule follow-up.  Signed, Jonelle SidleSamuel G. Mari Battaglia, MD, [facility] Center[facility] 11/21/2016 4:20 PM    [facility] [address], [place], [place] [address] Phone: [phone]) [phone]; Fax: [phone]

## 2016-11-21 ENCOUNTER — Ambulatory Visit (INDEPENDENT_AMBULATORY_CARE_PROVIDER_SITE_OTHER): Payer: BLUE CROSS/BLUE SHIELD | Admitting: Cardiology

## 2016-11-21 ENCOUNTER — Encounter: Payer: Self-pay | Admitting: Cardiology

## 2016-11-21 VITALS — BP 140/98 | HR 73 | Ht 65.0 in | Wt 175.0 lb

## 2016-11-21 DIAGNOSIS — I7101 Dissection of ascending aorta: Secondary | ICD-10-CM

## 2016-11-21 DIAGNOSIS — I350 Nonrheumatic aortic (valve) stenosis: Secondary | ICD-10-CM | POA: Diagnosis not present

## 2016-11-21 DIAGNOSIS — I471 Supraventricular tachycardia: Secondary | ICD-10-CM

## 2016-11-21 DIAGNOSIS — R002 Palpitations: Secondary | ICD-10-CM | POA: Diagnosis not present

## 2016-11-21 NOTE — Patient Instructions (Signed)
Medication Instructions:  Your physician recommends that you continue on your current medications as directed. Please refer to the Current Medication list given to you today.   Labwork: NONE  Testing/Procedures: Non-Cardiac CT Angiography (CTA), is a special type of CT scan that uses a computer to produce multi-dimensional views of major blood vessels throughout the body. In CT angiography, a contrast material is injected through an IV to help visualize the blood vessels   Follow-Up: Your physician recommends that you schedule a follow-up appointment in: TO BE DETERMINED    Any Other Special Instructions Will Be Listed Below (If Applicable).     If you need a refill on your cardiac medications before your next appointment, please call your pharmacy.

## 2016-11-28 ENCOUNTER — Telehealth: Payer: Self-pay

## 2016-11-28 ENCOUNTER — Telehealth: Payer: Self-pay | Admitting: Cardiology

## 2016-11-28 ENCOUNTER — Ambulatory Visit (HOSPITAL_COMMUNITY)
Admission: RE | Admit: 2016-11-28 | Discharge: 2016-11-28 | Disposition: A | Discharge status: Home / Self Care | Payer: BLUE CROSS/BLUE SHIELD | Source: Ambulatory Visit | Attending: Cardiology | Admitting: Cardiology

## 2016-11-28 DIAGNOSIS — I712 Thoracic aortic aneurysm, without rupture: Secondary | ICD-10-CM | POA: Diagnosis not present

## 2016-11-28 DIAGNOSIS — I7101 Dissection of ascending aorta: Secondary | ICD-10-CM

## 2016-11-28 DIAGNOSIS — N400 Enlarged prostate without lower urinary tract symptoms: Secondary | ICD-10-CM | POA: Diagnosis not present

## 2016-11-28 DIAGNOSIS — K5790 Diverticulosis of intestine, part unspecified, without perforation or abscess without bleeding: Secondary | ICD-10-CM | POA: Diagnosis not present

## 2016-11-28 DIAGNOSIS — N200 Calculus of kidney: Secondary | ICD-10-CM | POA: Diagnosis not present

## 2016-11-28 MED ORDER — METOPROLOL SUCCINATE ER 25 MG PO TB24: 25.0000 mg | ORAL_TABLET | Freq: Every day | ORAL | 3 refills | Status: DC

## 2016-11-28 MED ORDER — IOPAMIDOL (ISOVUE-370) INJECTION 76%
100.0000 mL | Freq: Once | INTRAVENOUS | Status: AC | PRN
  Administered 2016-11-28: 100 mL via INTRAVENOUS

## 2016-11-28 NOTE — Telephone Encounter (Signed)
Pt made aware to stop Xarelto. Start Toprol XL 25 mg daily. Have placed a referral for pt to be seen by EP ( Dr. Johney FrameAllred).

## 2016-11-28 NOTE — Addendum Note (Signed)
Addended by: Abelino DerrickMCGHEE, Bertie Mcconathy R on: 11/28/2016 03:18 PM   Modules accepted: Orders

## 2016-11-28 NOTE — Telephone Encounter (Signed)
I spoke with Dr. Johney FrameAllred today and we discussed the patient's cardiac monitor results. There is no clear evidence of atrial fibrillation or atrial flutter, and he should be able to go ahead and stop Xarelto for now. Rhythm that was documented could be a reentrant SVT with aberrancy, cannot entirely exclude VT, although his ejection fraction is normal. Would recommend starting him on a trial of Toprol-XL 25 mg daily, and schedule him to see Dr. Johney FrameAllred for EP consultation. EP study would be a consideration for further diagnostic purposes.

## 2016-11-28 NOTE — Telephone Encounter (Signed)
Pt made aware. He stated he will pick up medication.

## 2016-11-30 ENCOUNTER — Telehealth: Payer: Self-pay

## 2016-11-30 DIAGNOSIS — I712 Thoracic aortic aneurysm, without rupture: Secondary | ICD-10-CM

## 2016-11-30 DIAGNOSIS — I7121 Aneurysm of the ascending aorta, without rupture: Secondary | ICD-10-CM

## 2016-11-30 NOTE — Telephone Encounter (Addendum)
-----   Message from Jonelle SidleSamuel G McDowell, MD sent at 11/30/2016 11:49 AM EST ----- Results reviewed. 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 As we discussed at office visit, this is sometimes seen in the setting of a bicuspid aortic valve. Although asymptomatic and not yet at a point that it requires surgery, we should get consultation arranged with [facility] so that follow-up interval with re-imaging can be scheduled. Other noncardiac findings are not acute - would make sure that copy of report goes to his PCP as well. A copy of this test should be forwarded to HAWKINS,EDWARD L, MD.  I spoke with patient who asked I call his wife Boyd Kerbsenny.She requested office note as she states she had never heard about any aneurysm before.She understands this just a consultation for [facility]    Referral placed to [facility]

## 2016-11-30 NOTE — Telephone Encounter (Deleted)
-----   Message from Jonelle SidleSamuel G McDowell, MD sent at 11/30/2016 11:49 AM EST ----- Results reviewed. 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 As we discussed at office visit, this is sometimes seen in the setting of a bicuspid aortic valve. Although asymptomatic and not yet at a point that it requires surgery, we should get consultation arranged with [facility] so that follow-up interval with re-imaging can be scheduled. Other noncardiac findings are not acute - would make sure that copy of report goes to his PCP as well. A copy of this test should be forwarded to Fredirick MaudlinHAWKINS,EDWARD L, MD.

## 2016-12-10 ENCOUNTER — Institutional Professional Consult (permissible substitution): Payer: BLUE CROSS/BLUE SHIELD | Admitting: Internal Medicine

## 2016-12-19 ENCOUNTER — Encounter: Payer: Self-pay | Admitting: Surgery

## 2016-12-19 ENCOUNTER — Institutional Professional Consult (permissible substitution) (INDEPENDENT_AMBULATORY_CARE_PROVIDER_SITE_OTHER): Payer: BLUE CROSS/BLUE SHIELD | Admitting: Surgery

## 2016-12-19 VITALS — BP 137/88 | HR 68 | Resp 20 | Ht 65.0 in | Wt 175.0 lb

## 2016-12-19 DIAGNOSIS — I712 Thoracic aortic aneurysm, without rupture: Secondary | ICD-10-CM

## 2016-12-19 DIAGNOSIS — I7121 Aneurysm of the ascending aorta, without rupture: Secondary | ICD-10-CM

## 2016-12-21 ENCOUNTER — Encounter: Payer: Self-pay | Admitting: Surgery

## 2016-12-21 NOTE — Progress Notes (Signed)
Cardiothoracic Surgery Consultation  PCP is Fredirick Maudlin, MD Referring Provider is Jonelle Sidle, MD  Chief Complaint  Patient presents with  . Thoracic Aortic Aneurysm    Surgical eval, CTA Chest 11/28/16, ECHO 11/14/16    HPI:  The patient is a 60 year old gentleman with a history of a heart murmur as a child, hyperthyroidism, kidney stones and OSA on CPAP who was seen by Dr. Diona Browner for complaints of palpitations and shortness of breath associated with anxiety and feeling worn out. He had a monitor placed that showed sinus rhythm with an episode of SVT associated with aberrant conduction and retrograde P waves. He was started on Xarelto in case this was atrial fibrillation but after review of the monitor results with Dr. Johney Frame it was felt that this was not atrial fib or flutter and the Xarelto was stopped. He was started on Toprol XL.  He had an echo done as part of his workup which showed normal LV systolic function with an EF of 55-60% and grade 1 diastolic dysfunction. There was a possibly bicuspid aortic valve with moderate stenosis with a mean gradient of 14 mm Hg and a peak velocity ratio of 0.34 with a valve area of 1.05 cm2. The aorta was mildly dilated at 39 mm. CTA of the chest on 11/28/2016 showed a fusiform ascending aortic aneurysm with a diameter of 4.8 cm at the right PA and 4.2 cm at the sinus level.   Past Medical History:  Diagnosis Date  . Arthritis   . GERD (gastroesophageal reflux disease)   . Headache   . History of heart murmur in childhood   . Hyperthyroidism   . Impaired glucose tolerance   . Nephrolithiasis   . Sleep apnea    On CPAP    Past Surgical History:  Procedure Laterality Date  . CARDIAC CATHETERIZATION  1990s  . CYSTOSCOPY  1990s   With stone extraction  . CYSTOSCOPY W/ URETERAL STENT PLACEMENT Left 12/02/2015   Procedure: CYSTOSCOPY WITH RETROGRADE PYELOGRAM/URETERAL STENT PLACEMENT;  Surgeon: Malen Gauze, MD;  Location:  [facility];  Service: Urology;  Laterality: Left;  . ESOPHAGOGASTRODUODENOSCOPY  1990s  . KNEE ARTHROSCOPY Left 2005  . LITHOTRIPSY  1990s  . LITHOTRIPSY Left 11/2015  . UMBILICAL HERNIA REPAIR  late 1990s    Family History  Problem Relation Age of Onset  . Depression Mother   . Hyperlipidemia Mother   . Cancer Father   . Kidney disease Father   . Breast cancer Maternal Grandmother   . Heart disease Maternal Grandfather     Social History Social History  Substance Use Topics  . Smoking status: Passive Smoke Exposure - Never Smoker    Years: 17.00    Types: Cigarettes, Pipe  . Smokeless tobacco: Never Used  . Alcohol use Yes     Comment: Socially    Current Outpatient Prescriptions  Medication Sig Dispense Refill  . metoprolol succinate (TOPROL XL) 25 MG 24 hr tablet Take 1 tablet (25 mg total) by mouth daily. 90 tablet 3  . tamsulosin (FLOMAX) 0.4 MG CAPS capsule Take 1 capsule (0.4 mg total) by mouth daily after supper. 30 capsule 0  . traMADol (ULTRAM) 50 MG tablet Take 50-100 mg by mouth every 4 (four) hours as needed for moderate pain.    . Vitamin D, Cholecalciferol, 1000 units CAPS Take 1,000 mg by mouth 2 (two) times daily.    Marland Kitchen HYDROcodone-acetaminophen (NORCO/VICODIN) 5-325 MG tablet Take 1 tablet  by mouth every 6 (six) hours as needed for moderate pain.    Marland Kitchen ondansetron (ZOFRAN ODT) 4 MG disintegrating tablet Take 1 tablet (4 mg total) by mouth every 8 (eight) hours as needed for nausea or vomiting. (Patient not taking: Reported on 12/19/2016) 20 tablet 0  . promethazine (PHENERGAN) 25 MG tablet Take 25 mg by mouth every 6 (six) hours as needed for nausea or vomiting.     No current facility-administered medications for this visit.     Allergies  Allergen Reactions  . Demerol [Meperidine] Other (See Comments)    Makes pt hyper, the higher the dose the more hyper pt gets  . Stadol [Butorphanol] Other (See Comments)    Hyper- the higher the dose the more hyper pt  gets    Review of Systems  Constitutional: Negative for activity change, appetite change and fatigue.  HENT: Negative.   Eyes: Negative.   Respiratory: Positive for apnea.   Cardiovascular: Positive for palpitations.  Gastrointestinal: Negative.   Endocrine: Negative.   Genitourinary:       Kidney stones  Musculoskeletal: Positive for arthralgias.  Skin: Negative.   Allergic/Immunologic: Negative.   Neurological: Negative.   Hematological: Negative.   Psychiatric/Behavioral: Negative.     BP 137/88   Pulse 68   Resp 20   Ht 5\' 5"  (1.651 m)   Wt 175 lb (79.4 kg)   SpO2 98%   BMI 29.12 kg/m  Physical Exam  Constitutional: He is oriented to person, place, and time. He appears well-developed and well-nourished. No distress.  HENT:  Head: Normocephalic and atraumatic.  Mouth/Throat: Oropharynx is clear and moist.  Eyes: EOM are normal. Pupils are equal, round, and reactive to light.  Neck: Normal range of motion. Neck supple. No JVD present. No thyromegaly present.  Cardiovascular: Normal rate and regular rhythm.   Murmur heard. 1/6 systolic murmur RSB  Pulmonary/Chest: Effort normal and breath sounds normal. No respiratory distress. He has no wheezes. He exhibits no tenderness.  Abdominal: Soft. Bowel sounds are normal. He exhibits no distension and no mass. There is no tenderness.  Musculoskeletal: Normal range of motion. He exhibits no edema.  Lymphadenopathy:    He has no cervical adenopathy.  Neurological: He is alert and oriented to person, place, and time. He has normal strength. No cranial nerve deficit or sensory deficit.  Skin: Skin is warm and dry.  Psychiatric: He has a normal mood and affect.   Diagnostic Tests:     *[facility]                         [address]                        [place], [place] [address]                            [phone]  ------------------------------------------------------------------- Transthoracic  Echocardiography  Patient:    Shameek, Nyquist MR #:       [id number] Study Date: 11/14/2016 Gender:     M Age:        60 Height:     165.1 cm Weight:     78.9 kg BSA:        1.92 m^2 Pt. Status: Room:   ATTENDING    Nona Dell, M.D.  ORDERING     Nona Dell, M.D.  REFERRING    Nona Dell, M.D.  PERFORMING   [facility], [facility]  SONOGRAPHER  Celesta Gentile, RCS  cc:  ------------------------------------------------------------------- LV EF: 55% -   60%  ------------------------------------------------------------------- Indications:      Palpitations 785.1.  ------------------------------------------------------------------- History:   PMH:  Sleep apnea on CPAP, Murmur,  ------------------------------------------------------------------- Study Conclusions  - Left ventricle: The cavity size was normal. Wall thickness was   increased in a pattern of mild LVH. Systolic function was normal.   The estimated ejection fraction was in the range of 55% to 60%.   Wall motion was normal; there were no regional wall motion   abnormalities. Doppler parameters are consistent with abnormal   left ventricular relaxation (grade 1 diastolic dysfunction). - Aortic valve: Mildly calcified annulus. Possibly bicuspid;   moderately calcified leaflets. There was moderate stenosis. There   was trivial regurgitation. Mean gradient (S): 14 mm Hg. Peak   gradient (S): 26 mm Hg. VTI ratio of LVOT to aortic valve: 0.39.   Valve area (VTI): 1.24 cm^2. - Aortic root: The aortic root was mildly dilated. - Ascending aorta: The ascending aorta was mildly dilated. - Mitral valve: There was trivial regurgitation. - Right atrium: Central venous pressure (est): 3 mm Hg. - Tricuspid valve: There was trivial regurgitation. - Pulmonary arteries: PA peak pressure: 21 mm Hg (S). - Pericardium, extracardiac: There was no pericardial effusion.  Impressions:  - Mild LVH with LVEF 55-60%  and grade 1 diastolic dysfunction.   Trivial mitral regurgitation. Aortic valve looks to be bicuspid   and moderately calcified with evidence of overall moderate aortic   stenosis and trivial aortic regurgitation as outlined above.   Aortic root and ascending aorta are mildly dilated. Trivial   tricuspid regurgitation with PASP estimated 21 mmHg.  ------------------------------------------------------------------- Study data:  No prior study was available for comparison.  Study status:  Routine.  Procedure:  The patient reported no pain pre or post test. Transthoracic echocardiography. Image quality was adequate.  Study completion:  There were no complications. Transthoracic echocardiography.  M-mode, complete 2D, spectral Doppler, and color Doppler.  Birthdate:  Patient birthdate: 12-29-1955.  Age:  Patient is 60 yr old.  Sex:  Gender: male. BMI: 29 kg/m^2.  Blood pressure:     140/95  Patient status: Inpatient.  Study date:  Study date: 11/14/2016. Study time: 01:05 PM.  Location:  Echo laboratory.  -------------------------------------------------------------------  ------------------------------------------------------------------- Left ventricle:  The cavity size was normal. Wall thickness was increased in a pattern of mild LVH. Systolic function was normal. The estimated ejection fraction was in the range of 55% to 60%. Wall motion was normal; there were no regional wall motion abnormalities. Doppler parameters are consistent with abnormal left ventricular relaxation (grade 1 diastolic dysfunction).  ------------------------------------------------------------------- Aortic valve:   Mildly calcified annulus. Possibly bicuspid; moderately calcified leaflets.  Doppler:   There was moderate stenosis.   There was trivial regurgitation.    VTI ratio of LVOT to aortic valve: 0.39. Valve area (VTI): 1.24 cm^2. Indexed valve area (VTI): 0.64 cm^2/m^2. Peak velocity ratio of LVOT  to aortic valve: 0.34. Valve area (Vmax): 1.05 cm^2. Indexed valve area (Vmax): 0.55 cm^2/m^2. Mean velocity ratio of LVOT to aortic valve: 0.33. Valve area (Vmean): 1.05 cm^2. Indexed valve area (Vmean): 0.55 cm^2/m^2.    Mean gradient (S): 14 mm Hg. Peak gradient (S): 26 mm Hg.  ------------------------------------------------------------------- Aorta:  Aortic root: The aortic root was mildly dilated. Ascending aorta: The ascending aorta was mildly dilated.  ------------------------------------------------------------------- Mitral  valve:   The valve appears to be grossly normal.    Doppler:  There was trivial regurgitation.    Peak gradient (D): 3 mm Hg.   ------------------------------------------------------------------- Left atrium:  The atrium was normal in size.  ------------------------------------------------------------------- Right ventricle:  The cavity size was normal. Systolic function was normal.  ------------------------------------------------------------------- Pulmonic valve:    The valve appears to be grossly normal. Doppler:  There was trivial regurgitation.  ------------------------------------------------------------------- Tricuspid valve:   The valve appears to be grossly normal. Doppler:  There was trivial regurgitation.  ------------------------------------------------------------------- Right atrium:  The atrium was normal in size.  ------------------------------------------------------------------- Pericardium:  There was no pericardial effusion.  ------------------------------------------------------------------- Systemic veins: Inferior vena cava: The vessel was normal in size. The respirophasic diameter changes were in the normal range (>= 50%), consistent with normal central venous pressure.  ------------------------------------------------------------------- Measurements   Left ventricle                            Value           Reference  LV ID, ED, PLAX chordal                   44    mm       43 - 52  LV ID, ES, PLAX chordal                   27.5  mm       23 - 38  LV fx shortening, PLAX chordal            38    %        >=29  LV PW thickness, ED                       12.8  mm       ---------  IVS/LV PW ratio, ED                       0.95           <=1.3  Stroke volume, 2D                         65    ml       ---------  Stroke volume/bsa, 2D                     34    ml/m^2   ---------  LV ejection fraction, 1-p A4C             61    %        ---------  LV end-diastolic volume, 2-p              80    ml       ---------  LV end-systolic volume, 2-p               30    ml       ---------  LV ejection fraction, 2-p                 63    %        ---------  Stroke volume, 2-p                        50    ml       ---------  LV end-diastolic volume/bsa, 2-p          41    ml/m^2   ---------  LV end-systolic volume/bsa, 2-p           15    ml/m^2   ---------  Stroke volume/bsa, 2-p                    25.9  ml/m^2   ---------  LV e&', lateral                            9.79  cm/s     ---------  LV E/e&', lateral                          9.1            ---------  LV e&', medial                             5.66  cm/s     ---------  LV E/e&', medial                           15.74          ---------  LV e&', average                            7.73  cm/s     ---------  LV E/e&', average                          11.53          ---------    Ventricular septum                        Value          Reference  IVS thickness, ED                         12.2  mm       ---------    LVOT                                      Value          Reference  LVOT ID, S                                20    mm       ---------  LVOT area                                 3.14  cm^2     ---------  LVOT peak velocity, S                     85.5  cm/s     ---------  LVOT mean velocity, S                     56.5  cm/s     ---------  LVOT  VTI, S                               20.8  cm       ---------  LVOT peak gradient, S                     3     mm Hg    ---------    Aortic valve                              Value          Reference  Aortic valve peak velocity, S             255   cm/s     ---------  Aortic valve mean velocity, S             169   cm/s     ---------  Aortic valve VTI, S                       52.7  cm       ---------  Aortic mean gradient, S                   14    mm Hg    ---------  Aortic peak gradient, S                   26    mm Hg    ---------  VTI ratio, LVOT/AV                        0.39           ---------  Aortic valve area, VTI                    1.24  cm^2     ---------  Aortic valve area/bsa, VTI                0.64  cm^2/m^2 ---------  Velocity ratio, peak, LVOT/AV             0.34           ---------  Aortic valve area, peak velocity          1.05  cm^2     ---------  Aortic valve area/bsa, peak               0.55  cm^2/m^2 ---------  velocity  Velocity ratio, mean, LVOT/AV             0.33           ---------  Aortic valve area, mean velocity          1.05  cm^2     ---------  Aortic valve area/bsa, mean               0.55  cm^2/m^2 ---------  velocity  Aortic regurg pressure half-time          593   ms       ---------    Aorta                                     Value  Reference  Aortic root ID, ED                        39    mm       ---------    Left atrium                               Value          Reference  LA ID, A-P, ES                            44    mm       ---------  LA ID/bsa, A-P                    (H)     2.29  cm/m^2   <=2.2  LA volume, S                              43.3  ml       ---------  LA volume/bsa, S                          22.5  ml/m^2   ---------  LA volume, ES, 1-p A4C                    51.1  ml       ---------  LA volume/bsa, ES, 1-p A4C                26.5  ml/m^2   ---------  LA volume, ES, 1-p A2C                    35.2  ml        ---------  LA volume/bsa, ES, 1-p A2C                18.3  ml/m^2   ---------    Mitral valve                              Value          Reference  Mitral E-wave peak velocity               89.1  cm/s     ---------  Mitral A-wave peak velocity               108   cm/s     ---------  Mitral deceleration time          (H)     268   ms       150 - 230  Mitral peak gradient, D                   3     mm Hg    ---------  Mitral E/A ratio, peak                    0.8            ---------    Pulmonary arteries                        Value  Reference  PA pressure, S, DP                        21    mm Hg    <=30    Tricuspid valve                           Value          Reference  Tricuspid regurg peak velocity            214   cm/s     ---------  Tricuspid peak RV-RA gradient             18    mm Hg    ---------    Systemic veins                            Value          Reference  Estimated CVP                             3     mm Hg    ---------    Right ventricle                           Value          Reference  RV ID, ED, PLAX                           26.7  mm       19 - 38  TAPSE                                     17.8  mm       ---------  RV pressure, S, DP                        21    mm Hg    <=30  RV s&', lateral, S                         10    cm/s     ---------  Legend: (L)  and  (H)  mark values outside specified reference range.  ------------------------------------------------------------------- Prepared and Electronically Authenticated by  Nona Dell, M.D. 2017-11-22T16:11:50  CLINICAL DATA:  Tachycardia and dyspnea. Ascending aortic dissection. Nephrolithiasis, GERD, umbilical hernia repair, lithotripsy  EXAM: CT ANGIOGRAPHY CHEST, ABDOMEN AND PELVIS  TECHNIQUE: Multidetector CT imaging through the chest, abdomen and pelvis was performed using the standard protocol during bolus administration of intravenous contrast. Multiplanar  reconstructed images and MIPs were obtained and reviewed to evaluate the vascular anatomy.  CONTRAST:  100 cc of IV Isovue 370  COMPARISON:  12/01/2015 and 12/22/2009 noncontrast CT of the abdomen and pelvis  FINDINGS: CTA CHEST FINDINGS  Cardiovascular: No aortic dissection. Fusiform ascending aortic aneurysm measuring 4.8 cm in diameter at the level of the main pulmonary artery and 4.2 cm in the sinus of Valsalva. No large nor descending thoracic aortic aneurysm. No mediastinal hematoma. Heart is enlarged without pericardial effusion. Calcifications at the aortic valve.  Mediastinum/Nodes: No axillary, supraclavicular, mediastinal  nor hilar lymphadenopathy. Patent trachea and mainstem bronchi. Esophagus is unremarkable. No thyroid mass. Small hiatal hernia.  Lungs/Pleura: No effusion, pneumothorax nor pulmonary consolidation.  Musculoskeletal: No acute nor suspicious osseous abnormality.  Review of the MIP images confirms the above findings.  CTA ABDOMEN AND PELVIS FINDINGS  VASCULAR  Aorta: Normal caliber aorta without aneurysm, dissection, vasculitis or significant stenosis.  Celiac: Patent without evidence of aneurysm, dissection, vasculitis or significant stenosis.  SMA: Patent without evidence of aneurysm, dissection, vasculitis or significant stenosis.  Renals: Both renal arteries are patent without evidence of aneurysm, dissection, vasculitis, fibromuscular dysplasia or significant stenosis. Single renal arteries to both kidneys.  IMA: Patent  Inflow: Patent without evidence of aneurysm, dissection, vasculitis or significant stenosis.  Veins: No obvious venous abnormality within the limitations of this arterial phase study.  Review of the MIP images confirms the above findings.  NON-VASCULAR  Hepatobiliary: No focal mass of the liver nor ductal dilatation. Contracted gallbladder. No gallstones.  Pancreas: Negative  Spleen:  No splenomegaly  Adrenals/Urinary Tract: Nonobstructing renal calculi noted on the left within the lower pole the largest approximately 3 mm. No enhancing mass. No obstructive uropathy. The urinary bladder is unremarkable.  Stomach/Bowel: No acute bowel inflammation. Moderate colonic stool burden with left-sided colonic diverticulosis especially along the sigmoid colon. No bowel obstruction. Normal bowel rotation. Stomach is moderately distended with fluid.  Lymphatic: No adenopathy.  Reproductive: Enlarged prostate measuring 5.6 x 4.3 x 4.6 cm. Posterior peripheral zone calcification noted. Fat containing inguinal canals bilaterally possibly representing herniation of properitoneal fat or spermatic cord lipoma S. No significant change. No ascites or free air.  Other: Phleboliths are seen in the pelvis bilaterally.  Musculoskeletal: No acute osseous abnormality  Review of the MIP images confirms the above findings.  IMPRESSION: 1. Ascending aortic aneurysm measuring up to 4.8 cm. Recommend semi-annual imaging followup by CTA or MRA and referral to cardiothoracic surgery if not already obtained. This recommendation follows 2010 ACCF/AHA/AATS/ACR/ASA/SCA/SCAI/SIR/STS/SVM Guidelines for the Diagnosis and Management of Patients With Thoracic Aortic Disease. Circulation. 2010; 121: Z610-R604 2. No thoracic aortic dissection. 3. Nonobstructing left lower pole renal calculi measuring up to 3 mm. 4. Diverticulosis without acute diverticulitis. 5. Stable enlarged prostate.   Electronically Signed   By: Tollie Eth M.D.   On: 11/28/2016 21:57  Impression:  This 60 year old gentleman has a bicuspid aortic valve with moderate stenosis and a 4.8 cm fusiform ascending aortic aneurysm. I have personally reviewed his echo and CTA studies. The aortic valve does look like it is probably bicuspid with a mean gradient of 14 mm Hg and a peak velocity ratio of 0.23 consistent with  moderate AS. The aneurysm is still below the threshold for surgical repair which is usually 5.0 cm with an asymptomatic bicuspid aortic valve. The triggers for recommending surgical treatment in this patient would be if the aneurysm reached 5.0 cm or if he develops severe symptomatic aortic stenosis. If he required surgery for his aortic stenosis then I would replace the aorta at the same time. I reviewed the studies with him and his wife and answered all of their questions. I would recommend a repeat CTA in 6 months and echo in one year after the last study.   Plan:  I will see him back in June 2018 with a CTA of the chest. He will need to have a 2D echo done in November 2018 or sooner if he develops any new symptoms to suggest progression of his aortic  stenosis.  I spent 80 minutes performing this consultation and > 50% of this time was spent face to face counseling and coordinating the care of this patient's moderate aortic stenosis and ascending aortic aneurysm.   Alleen BorneBryan K Tashima Scarpulla, MD [facility] [phone]

## 2016-12-28 IMAGING — CT CT ANGIO CHEST-ABD-PELV FOR DISSECTION W/ AND WO/W CM
2 of 7 series · 13 of 46 positions shown, 15 images · IV contrast (Isovue)
Comparison: 12/01/2015 and 12/22/2009 noncontrast CT of the abdomen
and pelvis

CLINICAL DATA: Tachycardia and dyspnea. Ascending aortic
dissection. Nephrolithiasis, GERD, umbilical hernia repair,
lithotripsy

EXAM:
CT ANGIOGRAPHY CHEST, ABDOMEN AND PELVIS
TECHNIQUE: Multidetector CT imaging through the chest, abdomen and pelvis was
performed using the standard protocol during bolus administration of
intravenous contrast. Multiplanar reconstructed images and MIPs were
obtained and reviewed to evaluate the vascular anatomy.
CONTRAST:  100 cc of IV Isovue 370

[Series 6: dissection axial arterial · axial · arterial · 0.81mm/px · z∈[+1024,+1567]mm · 10 of 203 slices shown, 12 images]
[im 11/203  soft-tissue]
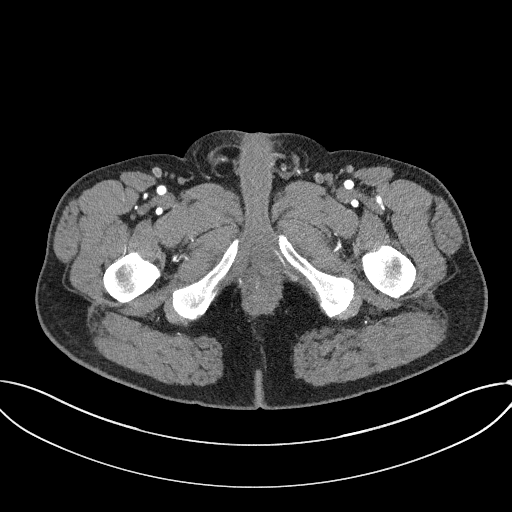
[im 11/203  bone]
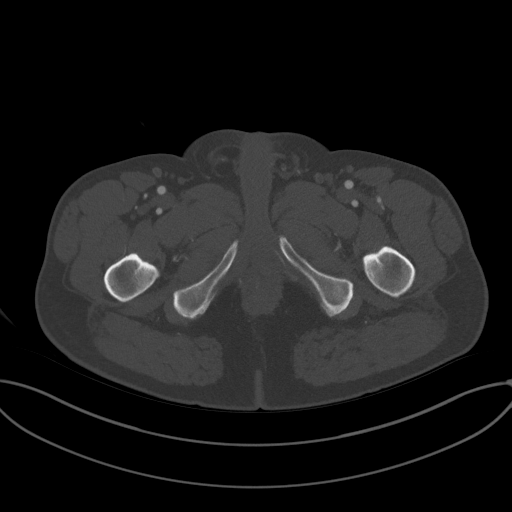
[im 31/203  soft-tissue]
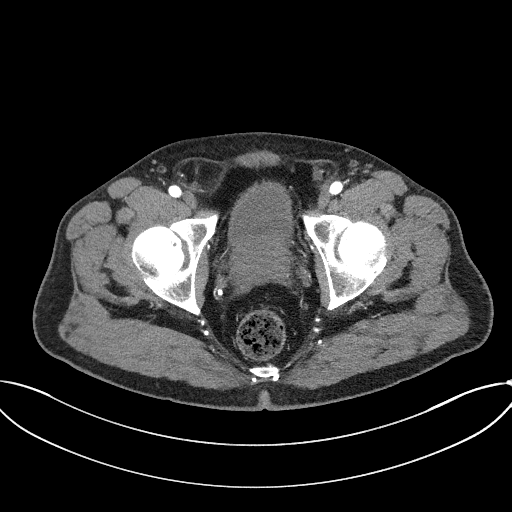
[im 51/203  soft-tissue]
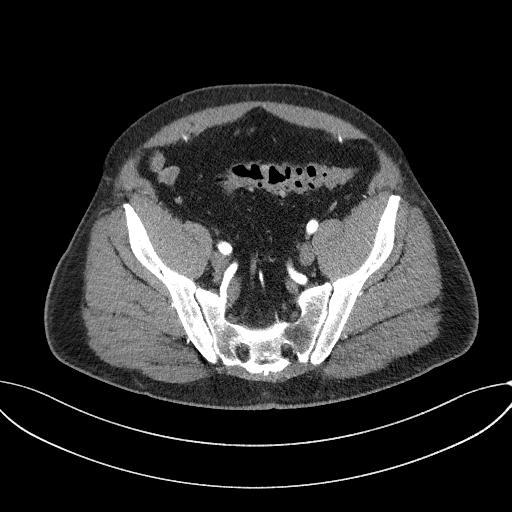
[im 71/203  soft-tissue]
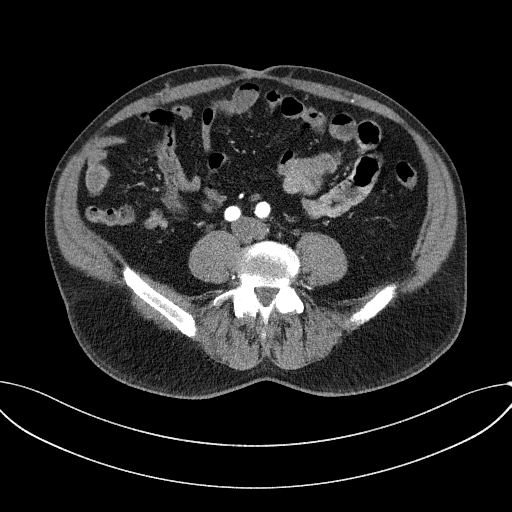
[im 91/203  soft-tissue]
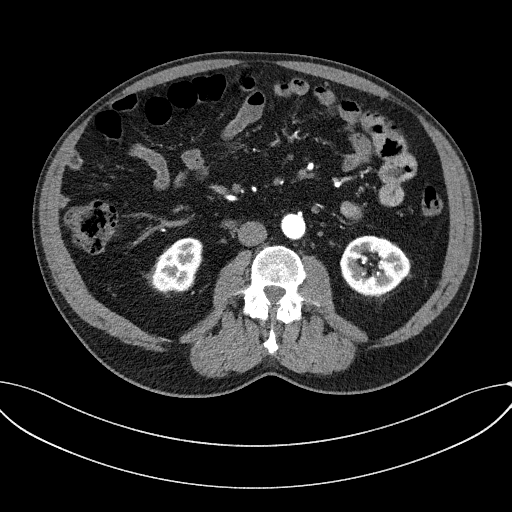
[im 112/203  soft-tissue]
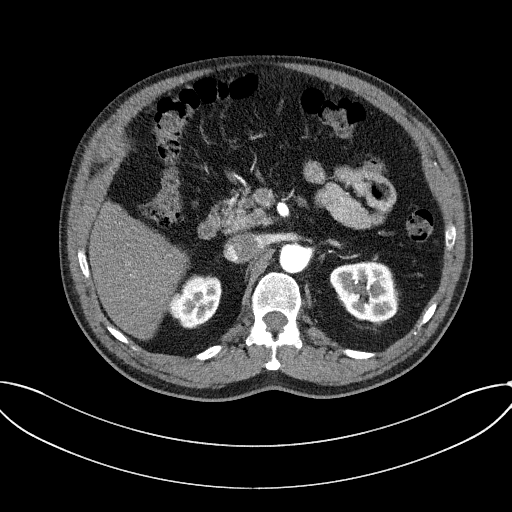
[im 132/203  soft-tissue]
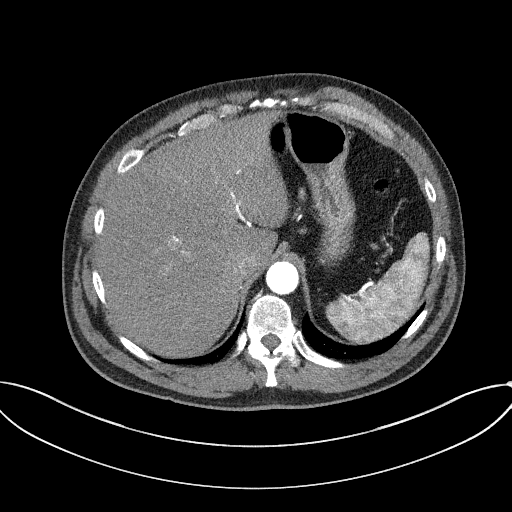
[im 152/203  soft-tissue]
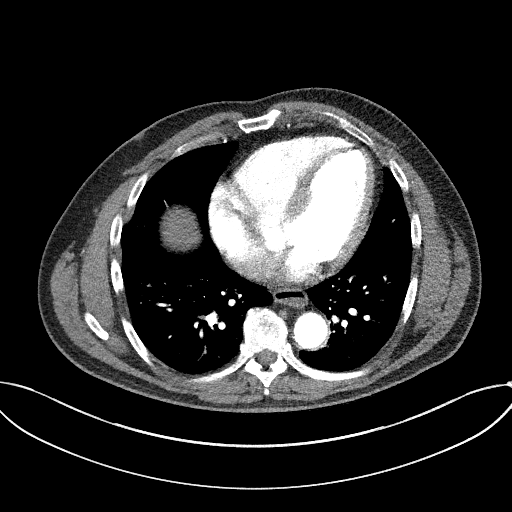
[im 172/203  soft-tissue]
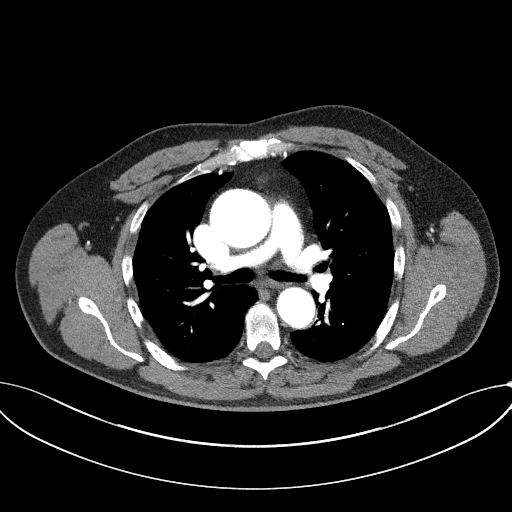
[im 172/203  bone]
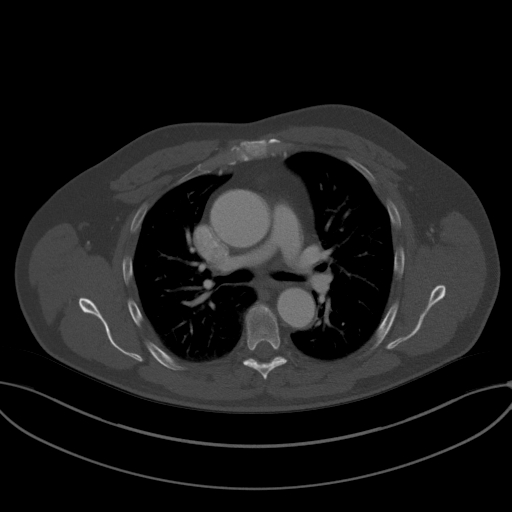
[im 192/203  soft-tissue]
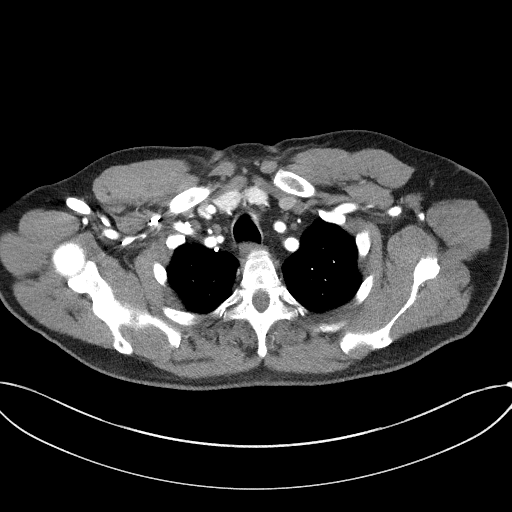

[Series 7: coronals · coronal · 0.71mm/px · 3 of 147 slices shown]
[im 37/147  soft-tissue]
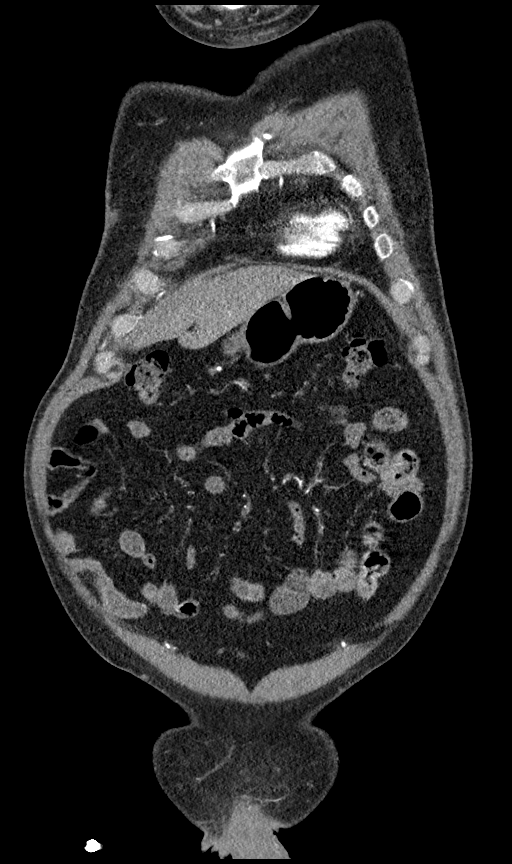
[im 74/147  soft-tissue]
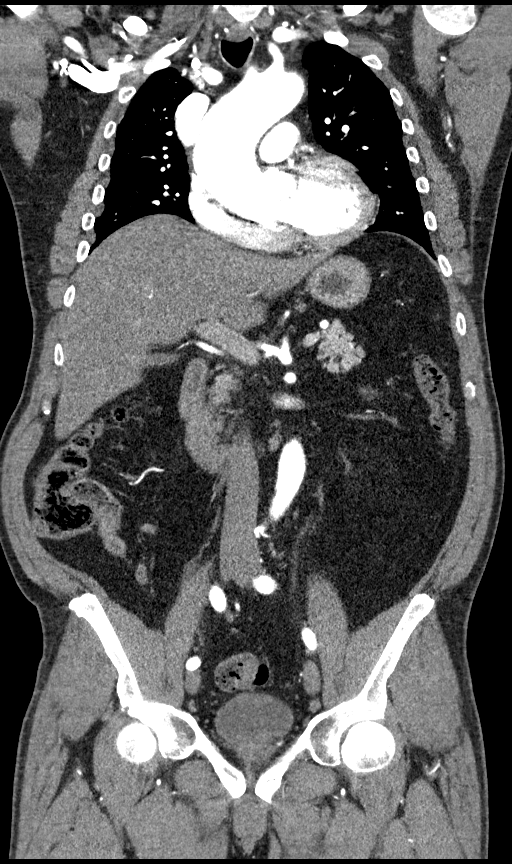
[im 110/147  soft-tissue]
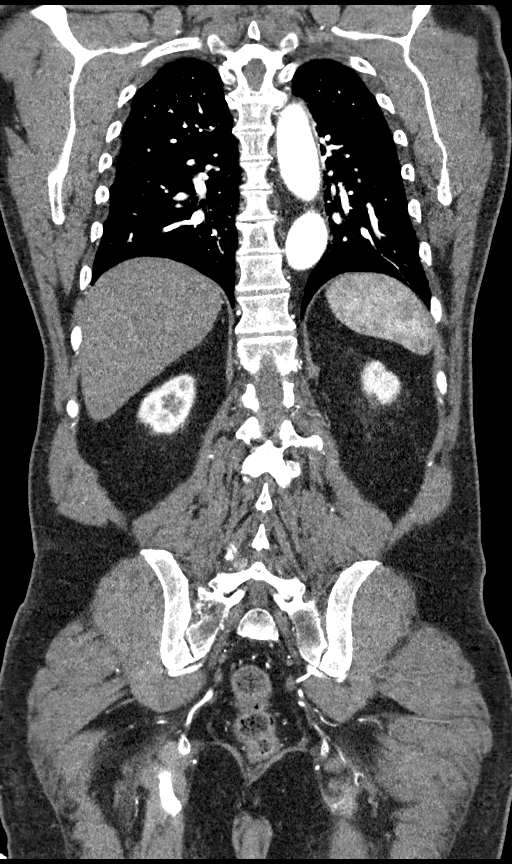

[13 of 46 positions shown; findings below may reference images not displayed]

FINDINGS: CTA CHEST FINDINGS

Cardiovascular: No aortic dissection. Fusiform ascending aortic
aneurysm measuring 4.8 cm in diameter at the level of the main
pulmonary artery and 4.2 cm in the sinus of Valsalva. No large nor
descending thoracic aortic aneurysm. No mediastinal hematoma. Heart
is enlarged without pericardial effusion. Calcifications at the
aortic valve.

Mediastinum/Nodes: No axillary, supraclavicular, mediastinal nor
hilar lymphadenopathy. Patent trachea and mainstem bronchi.
Esophagus is unremarkable. No thyroid mass. Small hiatal hernia.

Lungs/Pleura: No effusion, pneumothorax nor pulmonary consolidation.

Musculoskeletal: No acute nor suspicious osseous abnormality.

Review of the MIP images confirms the above findings.

CTA ABDOMEN AND PELVIS FINDINGS

VASCULAR

Aorta: Normal caliber aorta without aneurysm, dissection, vasculitis
or significant stenosis.

Celiac: Patent without evidence of aneurysm, dissection, vasculitis
or significant stenosis.

SMA: Patent without evidence of aneurysm, dissection, vasculitis or
significant stenosis.

Renals: Both renal arteries are patent without evidence of aneurysm,
dissection, vasculitis, fibromuscular dysplasia or significant
stenosis. Single renal arteries to both kidneys.

IMA: Patent

Inflow: Patent without evidence of aneurysm, dissection, vasculitis
or significant stenosis.

Veins: No obvious venous abnormality within the limitations of this
arterial phase study.

Review of the MIP images confirms the above findings.

NON-VASCULAR

Hepatobiliary: No focal mass of the liver nor ductal dilatation.
Contracted gallbladder. No gallstones.

Pancreas: Negative

Spleen: No splenomegaly

Adrenals/Urinary Tract: Nonobstructing renal calculi noted on the
left within the lower pole the largest approximately 3 mm. No
enhancing mass. No obstructive uropathy. The urinary bladder is
unremarkable.

Stomach/Bowel: No acute bowel inflammation. Moderate colonic stool
burden with left-sided colonic diverticulosis especially along the
sigmoid colon. No bowel obstruction. Normal bowel rotation. Stomach
is moderately distended with fluid.

Lymphatic: No adenopathy.

Reproductive: Enlarged prostate measuring 5.6 x 4.3 x 4.6 cm.
Posterior peripheral zone calcification noted. Fat containing
inguinal canals bilaterally possibly representing herniation of
properitoneal fat or spermatic cord lipoma S. No significant change.
No ascites or free air.

Other: Phleboliths are seen in the pelvis bilaterally.

Musculoskeletal: No acute osseous abnormality

Review of the MIP images confirms the above findings.
IMPRESSION: 1. Ascending aortic aneurysm measuring up to 4.8 cm. Recommend
semi-annual imaging followup by CTA or MRA and referral to
cardiothoracic surgery if not already obtained. This recommendation
follows 8262 ACCF/AHA/AATS/ACR/ASA/SCA/TITUS/HENG/FOFANAH/KARAM Guidelines
for the Diagnosis and Management of Patients With Thoracic Aortic
Disease. Circulation. 8262; 121: [id number]
2. No thoracic aortic dissection.
3. Nonobstructing left lower pole renal calculi measuring up to 3
mm.
4. Diverticulosis without acute diverticulitis.
5. Stable enlarged prostate.

## 2017-05-10 ENCOUNTER — Other Ambulatory Visit: Payer: Self-pay | Admitting: Surgery

## 2017-05-10 DIAGNOSIS — I7121 Aneurysm of the ascending aorta, without rupture: Secondary | ICD-10-CM

## 2017-05-10 DIAGNOSIS — I712 Thoracic aortic aneurysm, without rupture: Secondary | ICD-10-CM

## 2017-05-13 ENCOUNTER — Other Ambulatory Visit (HOSPITAL_COMMUNITY): Payer: Self-pay | Admitting: Pulmonary Disease

## 2017-05-13 DIAGNOSIS — I711 Thoracic aortic aneurysm, ruptured, unspecified: Secondary | ICD-10-CM

## 2017-05-29 ENCOUNTER — Encounter (HOSPITAL_COMMUNITY): Payer: Self-pay

## 2017-05-29 ENCOUNTER — Ambulatory Visit (HOSPITAL_COMMUNITY)
Admission: RE | Admit: 2017-05-29 | Discharge: 2017-05-29 | Disposition: A | Discharge status: Home / Self Care | Payer: BLUE CROSS/BLUE SHIELD | Source: Ambulatory Visit | Attending: Pulmonary Disease | Admitting: Pulmonary Disease

## 2017-05-29 DIAGNOSIS — I711 Thoracic aortic aneurysm, ruptured, unspecified: Secondary | ICD-10-CM

## 2017-05-29 DIAGNOSIS — I712 Thoracic aortic aneurysm, without rupture: Secondary | ICD-10-CM | POA: Insufficient documentation

## 2017-05-29 MED ORDER — IOPAMIDOL (ISOVUE-370) INJECTION 76%
100.0000 mL | Freq: Once | INTRAVENOUS | Status: AC | PRN
Start: 2017-05-29 — End: 2017-05-29
  Administered 2017-05-29: 100 mL via INTRAVENOUS

## 2017-06-05 ENCOUNTER — Ambulatory Visit (INDEPENDENT_AMBULATORY_CARE_PROVIDER_SITE_OTHER): Payer: BLUE CROSS/BLUE SHIELD | Admitting: Surgery

## 2017-06-05 ENCOUNTER — Encounter: Payer: Self-pay | Admitting: Surgery

## 2017-06-05 VITALS — BP 140/88 | HR 58 | Resp 20 | Ht 65.0 in | Wt 179.0 lb

## 2017-06-05 DIAGNOSIS — I712 Thoracic aortic aneurysm, without rupture: Secondary | ICD-10-CM | POA: Diagnosis not present

## 2017-06-05 DIAGNOSIS — I7121 Aneurysm of the ascending aorta, without rupture: Secondary | ICD-10-CM

## 2017-06-05 NOTE — Progress Notes (Signed)
HPI:  The patient returns for follow up of a 4.8 cm fusiform ascending aortic aneurysm with a bicuspid aortic valve. He had an echo done in 10/2016 showing an LVEF of 55-60% with a bicuspid aortic valve with moderate stenosis with a mean gradient of 14 mm Hg and a peak velocity ratio of 0.34. The aorta was noted to be 39 mm. He had a CTA of the chest on 11/28/2016 that showed the diameter of the ascending aorta to be 4.8 cm at the level of the right PA and 4.2 cm at the sinus level. He has continued to feel well. He denies any chest pain, shortness of breath, dizziness or fatigue.  Current Outpatient Prescriptions  Medication Sig Dispense Refill  . methimazole (TAPAZOLE) 5 MG tablet Take 5 mg by mouth daily.    . metoprolol succinate (TOPROL XL) 25 MG 24 hr tablet Take 1 tablet (25 mg total) by mouth daily. 90 tablet 3  . promethazine (PHENERGAN) 25 MG tablet Take 25 mg by mouth every 6 (six) hours as needed for nausea or vomiting.    . tamsulosin (FLOMAX) 0.4 MG CAPS capsule Take 1 capsule (0.4 mg total) by mouth daily after supper. 30 capsule 0  . traMADol (ULTRAM) 50 MG tablet Take 50-100 mg by mouth every 4 (four) hours as needed for moderate pain.    . Vitamin D, Cholecalciferol, 1000 units CAPS Take 1,000 mg by mouth 2 (two) times daily.     No current facility-administered medications for this visit.      Physical Exam: BP 140/88   Pulse (!) 58   Resp 20   Ht 5\' 5"  (1.651 m)   Wt 179 lb (81.2 kg)   SpO2 98% Comment: RA  BMI 29.79 kg/m  He looks well Cardiac exam shows a regular rate and rhythm with a 1/6 systolic murmur at the RSB. Lungs are clear  Diagnostic Tests:  CLINICAL DATA:  Ruptured thoracic aortic aneurysm.  Re-evaluate  EXAM: CT ANGIOGRAPHY CHEST, ABDOMEN AND PELVIS  TECHNIQUE: Multidetector CT imaging through the chest, abdomen and pelvis was performed using the standard protocol during bolus administration of intravenous contrast. Multiplanar  reconstructed images and MIPs were obtained and reviewed to evaluate the vascular anatomy.  CONTRAST:  100 cc Isovue 370 IV  COMPARISON:  11/28/2016  FINDINGS: CTA CHEST FINDINGS  Cardiovascular: Ascending aortic aneurysm again noted measuring 4.8 cm, stable. No dissection. Heart is borderline in size.  Mediastinum/Nodes: No mediastinal, hilar, or axillary adenopathy.  Lungs/Pleura: Lungs are clear. No focal airspace opacities or suspicious nodules. No effusions.  Musculoskeletal: No acute bony abnormality.  Review of the MIP images confirms the above findings.  CTA ABDOMEN AND PELVIS FINDINGS  VASCULAR  Aorta: No evidence of aortic aneurysm or dissection.  Celiac: Widely patent  SMA: Widely patent  Renals: Single bilaterally, widely patent  IMA: Widely patent  Inflow: Widely patent.  No aneurysm or dissection.  Veins: Grossly unremarkable.  Review of the MIP images confirms the above findings.  NON-VASCULAR  Hepatobiliary: No focal hepatic abnormality. Gallbladder unremarkable.  Pancreas: No focal abnormality or ductal dilatation.  Spleen: No focal abnormality.  Normal size.  Adrenals/Urinary Tract: No adrenal abnormality. No focal renal abnormality. Small nonobstructing left lower renal stones, stable. No hydronephrosis. Urinary bladder is unremarkable.  Stomach/Bowel: Few scattered sigmoid diverticula. No active diverticulitis. Appendix is normal. Stomach and small bowel decompressed.  Lymphatic: No adenopathy.  Reproductive: Prostate enlargement.  Other: No free fluid or free air.  Bilateral inguinal hernias containing fat.  Musculoskeletal: No acute bony abnormality.  Review of the MIP images confirms the above findings.  IMPRESSION: Stable ascending thoracic aortic aneurysm, 4.8 cm. Remainder of the aorta normal caliber. No dissection. Recommend semi-annual imaging followup by CTA or MRA and referral to  cardiothoracic surgery if not already obtained. This recommendation follows 2010 ACCF/AHA/AATS/ACR/ASA/SCA/SCAI/SIR/STS/SVM Guidelines for the Diagnosis and Management of Patients With Thoracic Aortic Disease. Circulation. 2010; 121: [id number]  Borderline cardiomegaly.  No acute findings in the chest, abdomen or pelvis.   Electronically Signed   By: Charlett Nose M.D.   On: 05/30/2017 08:08      Impression:  He has a stable 4.8 cm fusiform ascending aortic aneurysm with a bicuspid aortic valve that has moderate stenosis. He is asymptomatic. There is no indication for surgery at this time.  I have recommended a follow up echo yearly which will be in 10/2017 to reassess his degree of aortic stenosis. I will plan to see him back in 6 months with a CTA of the chest to follow up on his aneurysm. I reviewed the CT images with him and answered his questions.  Plan:  1.  Recommend 2D echo in 10/2017 which will be one year after his last one. This can be scheduled by Dr. Diona Browner.  2.  I will see him back in 6 months with a CTA of the chest that we will schedule.  I spent 15 minutes performing this established patient evaluation and > 50% of this time was spent face to face counseling and coordinating the care of this patient's aortic aneurysm.    Alleen Borne, MD [facility] [phone]

## 2017-12-23 ENCOUNTER — Other Ambulatory Visit (HOSPITAL_COMMUNITY): Payer: Self-pay | Admitting: Pulmonary Disease

## 2017-12-23 DIAGNOSIS — I7121 Aneurysm of the ascending aorta, without rupture: Secondary | ICD-10-CM

## 2017-12-23 DIAGNOSIS — I712 Thoracic aortic aneurysm, without rupture: Secondary | ICD-10-CM

## 2017-12-27 ENCOUNTER — Ambulatory Visit (HOSPITAL_COMMUNITY)
Admission: RE | Admit: 2017-12-27 | Discharge: 2017-12-27 | Disposition: A | Discharge status: Home / Self Care | Payer: Commercial Managed Care - PPO | Source: Ambulatory Visit | Attending: Pulmonary Disease | Admitting: Pulmonary Disease

## 2017-12-27 DIAGNOSIS — I712 Thoracic aortic aneurysm, without rupture: Secondary | ICD-10-CM | POA: Diagnosis present

## 2017-12-27 DIAGNOSIS — Q231 Congenital insufficiency of aortic valve: Secondary | ICD-10-CM | POA: Diagnosis present

## 2017-12-27 DIAGNOSIS — I7121 Aneurysm of the ascending aorta, without rupture: Secondary | ICD-10-CM

## 2017-12-27 DIAGNOSIS — I062 Rheumatic aortic stenosis with insufficiency: Secondary | ICD-10-CM | POA: Diagnosis not present

## 2017-12-27 LAB — ECHOCARDIOGRAM COMPLETE
AV Area VTI index: 0.6 cm2/m2
AV Area mean vel: 1.22 cm2
AV Peak grad: 26 mmHg
AV pk vel: 254 cm/s
AVAREAMEANVIN: 0.62 cm2/m2
AVAREAVTI: 1.18 cm2
AVCELMEANRAT: 0.39
AVG: 13 mmHg
Ao pk vel: 0.38 m/s
CHL CUP AV PEAK INDEX: 0.6
CHL CUP AV VALUE AREA INDEX: 0.6
CHL CUP AV VEL: 1.17
CHL CUP DOP CALC LVOT VTI: 22.2 cm
CHL CUP STROKE VOLUME: 43 mL
CHL CUP TV REG PEAK VELOCITY: 216 cm/s
DOP CAL AO MEAN VELOCITY: 159 cm/s
EERAT: 8.77
EWDT: 215 ms
FS: 37 % (ref 28–44)
IV/PV OW: 0.97
LA ID, A-P, ES: 35 mm
LA diam end sys: 35 mm
LA diam index: 1.79 cm/m2
LA vol A4C: 36.5 ml
LAVOL: 48.5 mL
LAVOLIN: 24.8 mL/m2
LV E/e' medial: 8.77
LV PW d: 12.5 mm — AB (ref 0.6–1.1)
LV e' LATERAL: 10.2 cm/s
LV sys vol index: 14 mL/m2
LV sys vol: 27 mL (ref 21–61)
LVDIAVOL: 70 mL (ref 62–150)
LVDIAVOLIN: 36 mL/m2
LVEEAVG: 8.77
LVOT area: 3.14 cm2
LVOT peak VTI: 0.37 cm
LVOT peak grad rest: 4 mmHg
LVOT peak vel: 95.3 cm/s
LVOTD: 20 mm
LVOTSV: 70 mL
MV Dec: 215
MV Peak grad: 3 mmHg
MV pk A vel: 98.5 m/s
MV pk E vel: 89.5 m/s
P 1/2 time: 643 ms
RV TAPSE: 15.9 mm
RV sys press: 22 mmHg
Simpson's disk: 62
TDI e' lateral: 10.2
TDI e' medial: 6.85
TR max vel: 216 cm/s
VTI: 59.8 cm
Valve area: 1.17 cm2

## 2017-12-27 NOTE — Progress Notes (Signed)
*  PRELIMINARY RESULTS* Echocardiogram 2D Echocardiogram has been performed.  Stacey DrainWhite, Monterrio Gerst J 12/27/2017, 3:53 PM

## 2018-01-02 ENCOUNTER — Other Ambulatory Visit (HOSPITAL_COMMUNITY): Payer: Self-pay | Admitting: Pulmonary Disease

## 2018-01-02 DIAGNOSIS — I712 Thoracic aortic aneurysm, without rupture, unspecified: Secondary | ICD-10-CM

## 2018-01-13 ENCOUNTER — Ambulatory Visit (INDEPENDENT_AMBULATORY_CARE_PROVIDER_SITE_OTHER): Payer: Commercial Managed Care - PPO | Admitting: Otolaryngology

## 2018-01-13 DIAGNOSIS — H9313 Tinnitus, bilateral: Secondary | ICD-10-CM

## 2018-01-13 DIAGNOSIS — H903 Sensorineural hearing loss, bilateral: Secondary | ICD-10-CM | POA: Diagnosis not present

## 2018-01-16 ENCOUNTER — Ambulatory Visit (HOSPITAL_COMMUNITY)
Admission: RE | Admit: 2018-01-16 | Discharge: 2018-01-16 | Disposition: A | Discharge status: Home / Self Care | Payer: Commercial Managed Care - PPO | Source: Ambulatory Visit | Attending: Pulmonary Disease | Admitting: Pulmonary Disease

## 2018-01-16 DIAGNOSIS — I712 Thoracic aortic aneurysm, without rupture, unspecified: Secondary | ICD-10-CM

## 2018-01-16 DIAGNOSIS — I700 Atherosclerosis of aorta: Secondary | ICD-10-CM | POA: Diagnosis not present

## 2018-01-16 MED ORDER — IOPAMIDOL (ISOVUE-370) INJECTION 76%
100.0000 mL | Freq: Once | INTRAVENOUS | Status: AC | PRN
  Administered 2018-01-16: 100 mL via INTRAVENOUS

## 2018-02-26 ENCOUNTER — Encounter: Payer: Self-pay | Admitting: Surgery

## 2018-02-26 ENCOUNTER — Ambulatory Visit (INDEPENDENT_AMBULATORY_CARE_PROVIDER_SITE_OTHER): Payer: Commercial Managed Care - PPO | Admitting: Surgery

## 2018-02-26 ENCOUNTER — Other Ambulatory Visit: Payer: Self-pay

## 2018-02-26 VITALS — BP 150/87 | HR 60 | Resp 20 | Ht 65.0 in | Wt 176.0 lb

## 2018-02-26 DIAGNOSIS — I712 Thoracic aortic aneurysm, without rupture, unspecified: Secondary | ICD-10-CM

## 2018-03-02 ENCOUNTER — Encounter: Payer: Self-pay | Admitting: Surgery

## 2018-03-02 NOTE — Progress Notes (Signed)
HPI: The patient returns today for follow-up of a 4.8 cm fusiform ascending aortic aneurysm with a history of possible bicuspid aortic valve and mild to moderate aortic stenosis.  I last saw him on 06/05/2017 at which time a CTA of the chest showed the ascending aortic aneurysm to be 4.8 cm.  An echo on 10/2016 showed an ejection fraction of 55-60% with a bicuspid aortic valve with mild to moderate stenosis in the mean aortic valve gradient of 14 mmHg.  The peak velocity ratio at that time was 0.34.  He was feeling well at that time we decided to continue following this.  He reports that since I last saw him he has developed some exertional shortness of breath and feels tired.  He has had no chest pain or dizziness.  He denies any orthopnea and denies peripheral edema.  He underwent a repeat echocardiogram on 12/27/2017 which showed a severely calcified aortic valve and annulus thickened leaflets.  The mean gradient was 13 mmHg with a peak gradient of 26 mmHg.  Peak velocity ratio was 0.38.  Aortic valve area was 1.2 cm.  Left ventricular ejection fraction was 60-65%.  He underwent a CTA of the chest on 01/17/2018 which showed the ascending aortic aneurysm to be 4.9 cm.  The aorta just proximal to the innominate artery was 4.0 cm.  Decreased to 3.6 cm between the innominate artery and the left common carotid artery and then 2.6 cm between the left common carotid and left subclavian arteries.  The descending thoracic aorta was somewhat elongated and tortuous.  He is with his wife today and she feels like he is tired because he is working a lot.  Current Outpatient Medications  Medication Sig Dispense Refill  . methimazole (TAPAZOLE) 5 MG tablet Take 2.5 mg by mouth every other day.     . metoprolol succinate (TOPROL XL) 25 MG 24 hr tablet Take 1 tablet (25 mg total) by mouth daily. 90 tablet 3  . promethazine (PHENERGAN) 25 MG tablet Take 25 mg by mouth every 6 (six) hours as needed for nausea or  vomiting.    . tamsulosin (FLOMAX) 0.4 MG CAPS capsule Take 1 capsule (0.4 mg total) by mouth daily after supper. 30 capsule 0  . traMADol (ULTRAM) 50 MG tablet Take 50-100 mg by mouth every 4 (four) hours as needed for moderate pain.    . Vitamin D, Cholecalciferol, 1000 units CAPS Take 1,000 mg by mouth 2 (two) times daily.     No current facility-administered medications for this visit.      Physical Exam: BP (!) 150/87   Pulse 60   Resp 20   Ht 5\' 5"  (1.651 m)   Wt 176 lb (79.8 kg)   SpO2 98% Comment: RA  BMI 29.29 kg/m  He looks well. Cardiac exam shows a regular rate and rhythm with a grade 2 out of 6 systolic murmur along the right sternal border.  There is no diastolic murmur. Lungs are clear. There is no peripheral edema.  Diagnostic Tests:  CLINICAL DATA:  Follow-up thoracic aortic aneurysm  EXAM: CT ANGIOGRAPHY CHEST WITH CONTRAST  TECHNIQUE: Multidetector CT imaging of the chest was performed using the standard protocol during bolus administration of intravenous contrast. Multiplanar CT image reconstructions and MIPs were obtained to evaluate the vascular anatomy.  CONTRAST:  ISOVUE-370 IOPAMIDOL (ISOVUE-370) INJECTION 76%  COMPARISON:  05/29/2017  FINDINGS: Cardiovascular: Maximal diameters of the ascending aorta at the sinus of Valsalva, sino-tubular  junction, and ascending aorta are 4.4 cm, 3.9 cm, and 4.9 cm respectively. Previously maximal diameter of the ascending aorta was recorded at 4.8 cm. This is not significantly changed. There is no evidence of aortic dissection allowing for motion artifact. There is no obvious intramural hematoma.  Great vessels are patent. Vertebral arteries are patent in the chest and lower neck.  No significant coronary artery calcifications. Moderately prominent aortic valvular calcification.  No obvious acute pulmonary thromboembolism.  Mediastinum/Nodes: No abnormal mediastinal adenopathy. Thyroid  is unremarkable. No pericardial effusion. Esophagus is unremarkable.  Lungs/Pleura: No pneumothorax.  No pleural effusion.  Clear lungs.  Upper Abdomen: Diffuse hepatic steatosis.  Musculoskeletal: No acute vertebral fracture. No acute rib fracture.  Review of the MIP images confirms the above findings.  IMPRESSION: Stable aneurysmal dilatation of the ascending aorta at 4.9 cm. Ascending thoracic aortic aneurysm. Recommend semi-annual imaging followup by CTA or MRA and referral to cardiothoracic surgery if not already obtained. This recommendation follows 2010 ACCF/AHA/AATS/ACR/ASA/SCA/SCAI/SIR/STS/SVM Guidelines for the Diagnosis and Management of Patients With Thoracic Aortic Disease. Circulation. 2010; 121: Z610-R604: [id number]  Aortic valvular calcifications are noted. This can be associated with aortic valve stenosis and post stenotic dilatation of the ascending aorta. Correlation with recent echocardiogram performed 12/27/2017 is recommended.   Electronically Signed   By: Jolaine ClickArthur  Hoss M.D.   On: 01/17/2018 07:40   Impression:  This 62 year old gentleman has a heavily calcified aortic valve and aortic annulus with mild to moderate aortic stenosis by gradient of 13 mmHg and a dimensionless index of 0.38.  The valve looks morphologically worse than the gradient would suggest.  It is possible that he has low gradient, normal ejection fraction, severe aortic stenosis which we see occasionally and is difficult to explain.  His symptoms of exertional fatigue, tiredness, and shortness of breath sound like they are related to aortic stenosis.  His ascending aortic aneurysm is essentially stable at 4.9 cm.  There is no absolute indication for proceeding with replacement of his aortic valve and ascending aorta at this time although I think if he continues to have progressive symptoms then he should be treated surgically even if his gradient is not in the severe range.  His aorta is still  below the usual surgical threshold of 5-5.5 cm with a bicuspid aortic valve but is getting close.  His aorta would most likely have to be replaced to the aortic arch between the left carotid and left subclavian arteries with bypass grafts up to the left common carotid and innominate artery.  I reviewed the echo and CTA studies with the patient and his wife and answered their questions.  I think this could be followed for a while longer but he should have a repeat CTA of the chest in 6 months and should probably have a repeat echocardiogram at that time to reevaluate his aortic valve gradient and left ventricle.  If his symptoms progress and I would rather see him back sooner to discuss proceeding with surgery.  Plan:  I will plan to get a repeat CTA of the chest in 6 months and he should have a repeat echocardiogram scheduled at that time.  I will see him back after those are completed.  He has wife knows to contact me or his other physicians if he develops progressive symptoms so that he can be reevaluated.   I spent 15 minutes performing this established patient evaluation and > 50% of this time was spent face to face counseling and coordinating the care  of this patient's bicuspid aortic valve stenosis and aortic aneurysm.    Alleen Borne, MD [facility] [phone]

## 2018-09-29 ENCOUNTER — Other Ambulatory Visit (HOSPITAL_COMMUNITY): Payer: Self-pay | Admitting: Pulmonary Disease

## 2018-09-29 DIAGNOSIS — I712 Thoracic aortic aneurysm, without rupture, unspecified: Secondary | ICD-10-CM

## 2018-10-06 ENCOUNTER — Other Ambulatory Visit (HOSPITAL_COMMUNITY): Payer: Self-pay | Admitting: Pulmonary Disease

## 2018-10-06 ENCOUNTER — Ambulatory Visit (HOSPITAL_COMMUNITY)
Admission: RE | Admit: 2018-10-06 | Discharge: 2018-10-06 | Disposition: A | Discharge status: Home / Self Care | Payer: Commercial Managed Care - PPO | Source: Ambulatory Visit | Attending: Pulmonary Disease | Admitting: Pulmonary Disease

## 2018-10-06 DIAGNOSIS — R0602 Shortness of breath: Secondary | ICD-10-CM

## 2018-10-06 DIAGNOSIS — I712 Thoracic aortic aneurysm, without rupture: Secondary | ICD-10-CM | POA: Diagnosis not present

## 2018-10-06 DIAGNOSIS — I350 Nonrheumatic aortic (valve) stenosis: Secondary | ICD-10-CM | POA: Insufficient documentation

## 2018-10-06 DIAGNOSIS — I517 Cardiomegaly: Secondary | ICD-10-CM | POA: Insufficient documentation

## 2018-10-06 NOTE — Progress Notes (Signed)
*  PRELIMINARY RESULTS* Echocardiogram 2D Echocardiogram has been performed.  Cameron [name] 10/06/2018, 4:15 PM

## 2018-10-16 ENCOUNTER — Ambulatory Visit (HOSPITAL_COMMUNITY)
Admission: RE | Admit: 2018-10-16 | Discharge: 2018-10-16 | Disposition: A | Discharge status: Home / Self Care | Payer: Commercial Managed Care - PPO | Source: Ambulatory Visit | Attending: Pulmonary Disease | Admitting: Pulmonary Disease

## 2018-10-16 DIAGNOSIS — I712 Thoracic aortic aneurysm, without rupture, unspecified: Secondary | ICD-10-CM

## 2018-10-16 DIAGNOSIS — I350 Nonrheumatic aortic (valve) stenosis: Secondary | ICD-10-CM | POA: Insufficient documentation

## 2018-10-16 DIAGNOSIS — I700 Atherosclerosis of aorta: Secondary | ICD-10-CM | POA: Insufficient documentation

## 2018-10-16 LAB — POCT I-STAT CREATININE: CREATININE: 1 mg/dL (ref 0.61–1.24)

## 2018-10-16 MED ORDER — IOPAMIDOL (ISOVUE-370) INJECTION 76%
100.0000 mL | Freq: Once | INTRAVENOUS | Status: AC | PRN
  Administered 2018-10-16: 100 mL via INTRAVENOUS

## 2018-11-19 ENCOUNTER — Other Ambulatory Visit: Payer: Self-pay | Admitting: *Deleted

## 2018-11-19 ENCOUNTER — Encounter: Payer: Self-pay | Admitting: Surgery

## 2018-11-19 ENCOUNTER — Encounter: Payer: Self-pay | Admitting: *Deleted

## 2018-11-19 ENCOUNTER — Ambulatory Visit (INDEPENDENT_AMBULATORY_CARE_PROVIDER_SITE_OTHER): Payer: Commercial Managed Care - PPO | Admitting: Surgery

## 2018-11-19 VITALS — BP 147/90 | HR 64 | Resp 20 | Ht 65.0 in | Wt 179.0 lb

## 2018-11-19 DIAGNOSIS — I7121 Aneurysm of the ascending aorta, without rupture: Secondary | ICD-10-CM

## 2018-11-19 DIAGNOSIS — I350 Nonrheumatic aortic (valve) stenosis: Secondary | ICD-10-CM

## 2018-11-19 DIAGNOSIS — I712 Thoracic aortic aneurysm, without rupture: Secondary | ICD-10-CM

## 2018-11-19 NOTE — Progress Notes (Signed)
HPI:  The patient returns today for follow-up of bicuspid aortic valve stenosis and insufficiency as well as a 4.9 cm fusiform ascending aortic aneurysm.  I last saw him on 02/26/2018 and his echocardiogram prior to that visit on 12/27/2017 showed a severely thickened and calcified aortic valve with severe annular calcification.  There is mild to moderate aortic stenosis with a mean gradient of 13 mmHg and a peak gradient of 26 mmHg.  When I last saw him he was reporting that he has some exertional shortness of breath and felt tired most of the time.  He is not having any chest discomfort or dizziness and no orthopnea or peripheral edema.  We decided to follow him up in 6 months.  He now reports that he has had about 6 episodes of dizziness and blurred vision since I last saw him with 2 episodes in the past week.  He said that he felt generally weak prior to the episode of dizziness and blurred vision and this resolved after couple minutes of sitting down.  There is no associated chest pain.  He continues to have some exertional shortness of breath particularly with lifting.  He has continue to work 10-hour days.  He has had some lower extremity edema develop.  Current Outpatient Medications  Medication Sig Dispense Refill  . methimazole (TAPAZOLE) 5 MG tablet Take 2.5 mg by mouth every other day.     . metoprolol succinate (TOPROL XL) 25 MG 24 hr tablet Take 1 tablet (25 mg total) by mouth daily. 90 tablet 3  . promethazine (PHENERGAN) 25 MG tablet Take 25 mg by mouth every 6 (six) hours as needed for nausea or vomiting.    . tamsulosin (FLOMAX) 0.4 MG CAPS capsule Take 1 capsule (0.4 mg total) by mouth daily after supper. 30 capsule 0  . traMADol (ULTRAM) 50 MG tablet Take 50-100 mg by mouth every 4 (four) hours as needed for moderate pain.    . Vitamin D, Cholecalciferol, 1000 units CAPS Take 1,000 mg by mouth 2 (two) times daily.     No current facility-administered medications for this visit.        Physical Exam: BP (!) 147/90   Pulse 64   Resp 20   Ht 5\' 5"  (1.651 m)   Wt 179 lb (81.2 kg)   SpO2 97% Comment: RA  BMI 29.79 kg/m  He looks well. Cardiac exam shows regular rate and rhythm with a 2/6 systolic murmur along the right sternal border.  There is no diastolic murmur. Lungs are clear. There is trace ankle edema bilaterally.  Diagnostic Tests:  Result status: Final result                      *[facility]*                         [address]                        [place], [place] [address]                            [phone]  ------------------------------------------------------------------- Transthoracic Echocardiography  Patient:    Ryle, Buscemi MR #:       [id number] Study Date: 10/06/2018 Gender:     M Age:  62 Height:     165.1 cm Weight:     79.8 kg BSA:        1.94 m^2 Pt. Status: Room:   ATTENDING    Kari Baars [address] [place] [id number]  Janet [place] [id number]  SONOGRAPHER  Jeryl Columbia  PERFORMING   [facility], [facility]  cc:  ------------------------------------------------------------------- LV EF: 60% -   65%  ------------------------------------------------------------------- Indications:      Aortic stenosis 424.1.  ------------------------------------------------------------------- History:   PMH:  4.8 cm fusiform ascending aortic aneurysm  Murmur.   ------------------------------------------------------------------- Study Conclusions  - Left ventricle: The cavity size was normal. Wall thickness was   increased in a pattern of mild LVH. Systolic function was normal.   The estimated ejection fraction was in the range of 60% to 65%.   Wall motion was normal; there were no regional wall motion   abnormalities. Doppler parameters are consistent with abnormal   left ventricular relaxation (grade 1 diastolic dysfunction).   Doppler parameters are  consistent with high ventricular filling   pressure. - Aortic valve: Severely calcified leaflets. There was trivial   regurgitation. Morphologically, the valve stenosis severity   appears worse than measured parameters. There is at least   moderate calcific aortic stenosis. Valve area (VTI): 1 cm^2.   Valve area (Vmax): 0.85 cm^2. Valve area (Vmean): 0.91 cm^2. - Aorta: Moderately dilated ascending thoracic aorta (4.57 cm in   maximal diameter). - Right ventricle: Systolic function was reduced.  Impressions:  - Consider TEE if deemed clinically indicated.  ------------------------------------------------------------------- Study data:  Comparison was made to the study of 12/27/2017.  Study status:  Routine.  Procedure:  Transthoracic echocardiography. Image quality was adequate.          Transthoracic echocardiography.  M-mode, complete 2D, spectral Doppler, and color Doppler.  Birthdate:  Patient birthdate: 11/09/1956.  Age:  Patient is 62 yr old.  Sex:  Gender: male.    BMI: 29.3 kg/m^2.  Blood pressure:     151/81  Patient status:  Outpatient.  Study date: Study date: 10/06/2018. Study time: 03:07 PM.  Location:  Echo laboratory.  -------------------------------------------------------------------  ------------------------------------------------------------------- Left ventricle:  The cavity size was normal. Wall thickness was increased in a pattern of mild LVH. Systolic function was normal. The estimated ejection fraction was in the range of 60% to 65%. Wall motion was normal; there were no regional wall motion abnormalities. Doppler parameters are consistent with abnormal left ventricular relaxation (grade 1 diastolic dysfunction). Doppler parameters are consistent with high ventricular filling pressure.   ------------------------------------------------------------------- Aortic valve:   Severely calcified leaflets. Uncertain leaflet number.  Doppler:  There was  trivial regurgitation. Morphologically,  the valve stenosis severity appears worse than measured parameters. There is at least moderate calcific aortic stenosis. VTI ratio of LVOT to aortic valve: 0.32. Valve area (VTI): 1 cm^2. Indexed valve area (VTI): 0.52 cm^2/m^2. Peak velocity ratio of LVOT to aortic valve: 0.27. Valve area (Vmax): 0.85 cm^2. Indexed valve area (Vmax): 0.44 cm^2/m^2. Mean velocity ratio of LVOT to aortic valve: 0.29. Valve area (Vmean): 0.91 cm^2. Indexed valve area (Vmean): 0.47 cm^2/m^2.    Mean gradient (S): 19 mm Hg. Peak gradient (S): 32 mm Hg.  ------------------------------------------------------------------- Aorta:  Moderately dilated ascending thoracic aorta (4.57 cm in maximal diameter). Aortic root: The aortic root was normal in size.  ------------------------------------------------------------------- Mitral valve:   Normal thickness leaflets .  Doppler:  There was no significant regurgitation.  Peak gradient (D): 3 mm Hg.  ------------------------------------------------------------------- Left atrium:  The atrium was normal in size.  ------------------------------------------------------------------- Right ventricle:  The cavity size was normal. Wall thickness was normal. Systolic function was reduced.  ------------------------------------------------------------------- Pulmonic valve:    The valve appears to be grossly normal. Doppler:  There was trivial regurgitation.  ------------------------------------------------------------------- Tricuspid valve:   The valve appears to be grossly normal. Doppler:  There was no significant regurgitation.  ------------------------------------------------------------------- Right atrium:  The atrium was normal in size.  ------------------------------------------------------------------- Pericardium:  There was no pericardial effusion.     ------------------------------------------------------------------- Post procedure conclusions Ascending Aorta:  - Moderately dilated ascending thoracic aorta (4.57 cm in maximal   diameter).  ------------------------------------------------------------------- Measurements   Left ventricle                           Value          Reference  LV ID, ED, PLAX chordal                  47.7  mm       43 - 52  LV ID, ES, PLAX chordal                  26.8  mm       23 - 38  LV fx shortening, PLAX chordal           44    %        >=29  LV PW thickness, ED                      12.7  mm       ----------  IVS/LV PW ratio, ED                      0.83           <=1.3  Stroke volume, 2D                        59    ml       ----------  Stroke volume/bsa, 2D                    30    ml/m^2   ----------  LV e&', lateral                           10.8  cm/s     ----------  LV E/e&', lateral                         7.5            ----------  LV e&', medial                            5     cm/s     ----------  LV E/e&', medial                          16.2           ----------  LV e&', average                           7.9  cm/s     ----------  LV E/e&', average                         10.25          ----------    Ventricular septum                       Value          Reference  IVS thickness, ED                        10.6  mm       ----------    LVOT                                     Value          Reference  LVOT ID, S                               20    mm       ----------  LVOT area                                3.14  cm^2     ----------  LVOT peak velocity, S                    77.6  cm/s     ----------  LVOT mean velocity, S                    58.1  cm/s     ----------  LVOT VTI, S                              18.7  cm       ----------    Aortic valve                             Value          Reference  Aortic valve peak velocity, S            285   cm/s     ----------  Aortic  valve mean velocity, S            201   cm/s     ----------  Aortic valve VTI, S                      58.5  cm       ----------  Aortic mean gradient, S                  19    mm Hg    ----------  Aortic peak gradient, S                  32    mm Hg    ----------  VTI ratio, LVOT/AV                       0.32           ----------  Aortic valve area, VTI                   1     cm^2     ----------  Aortic valve area/bsa, VTI               0.52  cm^2/m^2 ----------  Velocity ratio, peak, LVOT/AV            0.27           ----------  Aortic valve area, peak velocity         0.85  cm^2     ----------  Aortic valve area/bsa, peak              0.44  cm^2/m^2 ----------  velocity  Velocity ratio, mean, LVOT/AV            0.29           ----------  Aortic valve area, mean velocity         0.91  cm^2     ----------  Aortic valve area/bsa, mean              0.47  cm^2/m^2 ----------  velocity  Aortic regurg pressure half-time         653   ms       ----------    Aorta                                    Value          Reference  Aortic root ID, ED                       37    mm       ----------    Left atrium                              Value          Reference  LA ID, A-P, ES                           39    mm       ----------  LA ID/bsa, A-P                           2.01  cm/m^2   <=2.2  LA volume, S                             50.1  ml       ----------  LA volume/bsa, S                         25.9  ml/m^2   ----------  LA volume, ES, 1-p A4C                   45.2  ml       ----------  LA volume/bsa, ES, 1-p A4C               23.3  ml/m^2   ----------  LA volume, ES, 1-p A2C  52.1  ml       ----------  LA volume/bsa, ES, 1-p A2C               26.9  ml/m^2   ----------    Mitral valve                             Value          Reference  Mitral E-wave peak velocity              81    cm/s     ----------  Mitral A-wave peak velocity              91.7  cm/s     ----------   Mitral deceleration time         (H)     243   ms       150 - 230  Mitral peak gradient, D                  3     mm Hg    ----------  Mitral E/A ratio, peak                   0.9            ----------    Right atrium                             Value          Reference  RA ID, S-I, ES, A4C                      48.7  mm       34 - 49  RA area, ES, A4C                         15.7  cm^2     8.3 - 19.5  RA volume, ES, A/L                       40.7  ml       ----------  RA volume/bsa, ES, A/L                   21    ml/m^2   ----------    Systemic veins                           Value          Reference  Estimated CVP                            3     mm Hg    ----------    Right ventricle                          Value          Reference  TAPSE                                    14.7  mm       ----------  RV s&', lateral, S  7.62  cm/s     ----------  Legend: (L)  and  (H)  mark values outside specified reference range.  ------------------------------------------------------------------- Prepared and Electronically Authenticated by  Prentice Docker, MD 2019-10-14T16:30:29    CLINICAL DATA:  Thoracic aortic aneurysm without rupture, follow-up, currently asymptomatic  EXAM: CT ANGIOGRAPHY CHEST WITH CONTRAST  TECHNIQUE: Multidetector CT imaging of the chest was performed using the standard protocol during bolus administration of intravenous contrast. Multiplanar CT image reconstructions and MIPs were obtained to evaluate the vascular anatomy.  CONTRAST:  ISOVUE-370 IOPAMIDOL (ISOVUE-370) INJECTION 76%  COMPARISON:  01/16/2018  FINDINGS: Cardiovascular: Mild four-chamber cardiac enlargement. No pericardial effusion. Satisfactory opacification of pulmonary arteries noted, and there is no evidence of pulmonary emboli. Heavy aortic leaflet calcifications. Good contrast opacification of the thoracic aorta with transverse dimensions as  follows:  4.1 cm sinuses of Valsalva  3.9 cm sino-tubular junction  4.9 cm proximal ascending (previously 4.9)  4.8 cm mid ascending  4.1 cm distal ascending/proximal arch  2.5 cm distal arch  3.3 cm proximal descending  2.7 cm distal descending  Negative for dissection or stenosis. No significant atheromatous irregularity. Classic 3 vessel brachiocephalic arterial origin anatomy without proximal stenosis. Visualized portions of proximal abdominal segment unremarkable.  Mediastinum/Nodes: No hilar or mediastinal adenopathy.  Lungs/Pleura: No pleural effusion. No pneumothorax. Lungs are clear.  Upper Abdomen: No acute findings.  Musculoskeletal: Mild spondylitic changes in the lower cervical spine. 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 Negative for fracture or worrisome bone lesion.  Review of the MIP images confirms the above findings.  IMPRESSION: 1. Stable 4.9 cm ascending thoracic aortic aneurysm. Recommend semi-annual imaging followup by CTA or MRA and referral to cardiothoracic surgery if not already obtained. This recommendation follows 2010 ACCF/AHA/AATS/ACR/ASA/SCA/SCAI/SIR/STS/SVM Guidelines for the Diagnosis and Management of Patients With Thoracic Aortic Disease. Circulation. 2010; 121: Z610-R604 2. Heavy aortic leaflet calcifications. Correlate with echocardiographic evidence of aortic stenosis or insufficiency.   Electronically Signed   By: Corlis Leak M.D.   On: 10/16/2018 19:58  Impression:  This 62 year old gentleman has a history of a bicuspid aortic valve that has become progressively more calcified.  The mean gradient across his valve is 19 mmHg although his valve looks severely stenotic.  The aortic valve area is measured at 0.85 cm by V-max.  He also has a 4.9 cm fusiform ascending aortic aneurysm.  The diameter of the proximal arch at the takeoff of the innominate artery is 4.0 cm.  The aorta decreases to 3 cm in diameter between the  innominate and left carotid arteries.  I am concerned about the appearance of his aortic valve as well as the development of multiple episodes of dizziness and blurred vision with mild exertion.  He has New York Heart Association class III symptoms of exertional fatigue and shortness of breath consistent with chronic diastolic congestive heart failure and his echocardiogram shows grade 1 diastolic dysfunction with Doppler parameters suggesting high ventricular filling pressure.  I think it would be best to proceed with aortic valve replacement and replacement of his ascending aortic aneurysm.  I reviewed the echocardiogram and CT images with him and his wife and answered all their questions.  I discussed the alternatives of mechanical and bioprosthetic aortic valves.  He and his wife would like to use a bioprosthetic valve to avoid the need for Coumadin and I think that is a reasonable option at his age.  He will require cardiac catheterization preoperatively and we will have that arranged through Dr. Ival Bible office.  Plan:  The patient will have cardiac catheterization arranged through Dr. Ival Bible office.  I will review the results of that and discuss them with the patient by telephone.  He and his wife would like to have surgery scheduled for the last week of December.  I will schedule him for Bentall procedure using a bioprosthetic valved graft and replacement of the ascending aortic aneurysm on Monday, 12/22/2018.  I spent 15 minutes performing this established patient evaluation and > 50% of this time was spent face to face counseling and coordinating the care of this patient's bicuspid aortic valve stenosis and aortic aneurysm.    Alleen Borne, MD [facility] [phone]

## 2018-11-19 NOTE — Anesthesia Preprocedure Evaluation (Addendum)
Anesthesia Evaluation  Patient identified by MRN, date of birth, ID band Patient awake    Reviewed: Allergy & Precautions, NPO status , Patient's Chart, lab work & pertinent test results, reviewed documented beta blocker date and time   History of Anesthesia Complications Negative for: history of anesthetic complications  Airway Mallampati: III  TM Distance: >3 FB Neck ROM: Full    Dental  (+) Dental Advisory Given, Chipped,    Pulmonary sleep apnea and Continuous Positive Airway Pressure Ventilation ,    breath sounds clear to auscultation       Cardiovascular + Valvular Problems/Murmurs AS  Rhythm:Regular Rate:Normal + Systolic murmurs  AAA  '19 Carotid US - 1-39% b/l ICAS  '19 Cath - Ao sat 99%, PA sat 77%, mean PA pressure 13 mm Hg; mean PCWP 6 mm Hg; CO 5.4 L/min; CI 2.87 No angiographically apparent CAD. Normal pulmonary artery pressures. Tortuous subclavian with apparent lusoria anomaly. Would not use right radial for heart cath in the future.  '19 TTE - mild LVH. EF 60-65%. Grade 1 diastolic dysfunction. Trivial AI. Morphologically, the valve stenosis severity appears worse than measured parameters. There is at least moderate calcific aortic stenosis. VTI ratio of LVOT to aortic valve: 0.32. Valve area (VTI): 1 cm^2. Indexed valve area (VTI): 0.52 cm^2/m^2. Peak velocity ratio of LVOT to aortic valve: 0.27. Valve area (Vmax): 0.85 cm^2. Indexed valve area (Vmax): 0.44 cm^2/m^2. Mean velocity ratio of LVOT to aortic valve: 0.29. Valve area (Vmean): 0.91 cm^2. Indexed valve area (Vmean): 0.47 cm^2/m^2.    Mean gradient (S): 19 mm Hg. Peak gradient (S): 32 mm Hg. Moderately dilated ascending thoracic aorta (4.57 cm in maximal diameter). Reduced RV systolic function. Trivial PR.   Neuro/Psych  Headaches, negative psych ROS   GI/Hepatic Neg liver ROS, GERD  Controlled,  Endo/Other  Hyperthyroidism   Renal/GU Renal disease  (kidney stones)     Musculoskeletal  (+) Arthritis ,   Abdominal   Peds  Hematology negative hematology ROS (+)   Anesthesia Other Findings   Reproductive/Obstetrics                           Anesthesia Physical Anesthesia Plan  ASA: IV  Anesthesia Plan: General   Post-op Pain Management:    Induction: Intravenous  PONV Risk Score and Plan: 2 and Treatment may vary due to age or medical condition, Ondansetron, Dexamethasone and Midazolam  Airway Management Planned: Oral ETT  Additional Equipment: Arterial line, CVP, PA Cath, TEE and Ultrasound Guidance Line Placement  Intra-op Plan: Utilization Of Total Body Hypothermia per surgeon request and Delibrate Circulatory arrest per surgeon request  Post-operative Plan: Post-operative intubation/ventilation  Informed Consent: I have reviewed the patients History and Physical, chart, labs and discussed the procedure including the risks, benefits and alternatives for the proposed anesthesia with the patient or authorized representative who has indicated his/her understanding and acceptance.   Dental advisory given  Plan Discussed with: CRNA and Anesthesiologist  Anesthesia Plan Comments: ( Bilateral radial arterial lines per surgeon request.)      Anesthesia Quick Evaluation

## 2018-11-27 ENCOUNTER — Ambulatory Visit: Payer: Commercial Managed Care - PPO | Admitting: Cardiology

## 2018-11-28 ENCOUNTER — Ambulatory Visit: Payer: Commercial Managed Care - PPO | Admitting: Cardiology

## 2018-12-04 ENCOUNTER — Ambulatory Visit (INDEPENDENT_AMBULATORY_CARE_PROVIDER_SITE_OTHER): Payer: Commercial Managed Care - PPO | Admitting: Physician Assistant

## 2018-12-04 ENCOUNTER — Encounter: Payer: Self-pay | Admitting: Physician Assistant

## 2018-12-04 VITALS — BP 132/90 | HR 61 | Ht 65.0 in | Wt 180.6 lb

## 2018-12-04 DIAGNOSIS — E039 Hypothyroidism, unspecified: Secondary | ICD-10-CM

## 2018-12-04 DIAGNOSIS — Z01818 Encounter for other preprocedural examination: Secondary | ICD-10-CM

## 2018-12-04 DIAGNOSIS — G4733 Obstructive sleep apnea (adult) (pediatric): Secondary | ICD-10-CM

## 2018-12-04 DIAGNOSIS — Q231 Congenital insufficiency of aortic valve: Secondary | ICD-10-CM

## 2018-12-04 DIAGNOSIS — I350 Nonrheumatic aortic (valve) stenosis: Secondary | ICD-10-CM

## 2018-12-04 NOTE — H&P (View-Only) (Signed)
 Cardiology Office Note    Date:  12/06/2018   ID:  Cameron [name], DOB 10/18/1956, MRN [id number]  PCP:  Hawkins, Edward, MD  Cardiologist: Dr. Hochrein (previously Dr. McDowell)  No chief complaint on file.   History of Present Illness:  Cameron [name] is a 62 y.o. male with past medical history of hypothyroidism, bicuspid aortic valve, kidney stone, obstructive sleep apnea on CPAP.  Patient was previously seen by Dr. McDowell in November 2017 for evaluation of palpitation.  Cardiac monitor at the time showed sinus rhythm with episodes of regular SVT associated with aberrant conduction and the retrograde P waves.  There was no definitive atrial fibrillation.  He also has longstanding history of heart murmur.  Previous echocardiogram obtained on 11/14/2016 showed EF 55 to 60%, grade 1 DD, moderate aortic stenosis.  Repeat echocardiogram obtained in January 2019 showed EF 60 to 65%, severely thickened aortic valve with mild to moderate stenosis, however morphologically looks worse than measured parameters.  Most recent echocardiogram obtained on 10/06/2018 showed EF 60 to 65%, grade 1 DD, at least moderate aortic stenosis, moderately dilated ascending aorta measuring 4.57 cm.  He has been followed as outpatient by Dr. Bartle.  Recently in the past 6 months, he had at least 6 episodes of dizziness and blurred vision.  He also continue to complain of exertional dyspnea.  He was seen by Dr. Bartle who recommended him to proceed with dental procedure with bioprosthetic AVR.  He will need cardiac catheterization prior to the procedure.  Patient presented to [facility] as he prefer to be followed in [place]. I have discussed the case with DOD Dr. Hochrein who plan to take the patient on.     Past Medical History:  Diagnosis Date  . Arthritis   . GERD (gastroesophageal reflux disease)   . Headache   . History of heart murmur in childhood   . Hyperthyroidism   . Impaired glucose  tolerance   . Nephrolithiasis   . Sleep apnea    On CPAP    Past Surgical History:  Procedure Laterality Date  . CARDIAC CATHETERIZATION  1990s  . CYSTOSCOPY  1990s   With stone extraction  . CYSTOSCOPY W/ URETERAL STENT PLACEMENT Left 12/02/2015   Procedure: CYSTOSCOPY WITH RETROGRADE PYELOGRAM/URETERAL STENT PLACEMENT;  Surgeon: Patrick L McKenzie, MD;  Location: [facility];  Service: Urology;  Laterality: Left;  . ESOPHAGOGASTRODUODENOSCOPY  1990s  . KNEE ARTHROSCOPY Left 2005  . LITHOTRIPSY  1990s  . LITHOTRIPSY Left 11/2015  . UMBILICAL HERNIA REPAIR  late 1990s    Current Medications: Outpatient Medications Prior to Visit  Medication Sig Dispense Refill  . metoprolol succinate (TOPROL XL) 25 MG 24 hr tablet Take 1 tablet (25 mg total) by mouth daily. 90 tablet 3  . promethazine (PHENERGAN) 25 MG tablet Take 25 mg by mouth every 6 (six) hours as needed for nausea or vomiting.    . tamsulosin (FLOMAX) 0.4 MG CAPS capsule Take 1 capsule (0.4 mg total) by mouth daily after supper. 30 capsule 0  . traMADol (ULTRAM) 50 MG tablet Take 50-100 mg by mouth every 4 (four) hours as needed (for arthritis pain.).     . Vitamin D, Cholecalciferol, 1000 units CAPS Take 1,000 mg by mouth 2 (two) times daily.    . methimazole (TAPAZOLE) 5 MG tablet Take 2.5 mg by mouth every other day.      No facility-administered medications prior to visit.      Allergies:     Demerol [meperidine] and Stadol [butorphanol]   Social History   Socioeconomic History  . Marital status: Married    Spouse name: Not on file  . Number of children: Not on file  . Years of education: Not on file  . Highest education level: Not on file  Occupational History  . Not on file  Social Needs  . Financial resource strain: Not on file  . Food insecurity:    Worry: Not on file    Inability: Not on file  . Transportation needs:    Medical: Not on file    Non-medical: Not on file  Tobacco Use  . Smoking status: Passive  Smoke Exposure - Never Smoker  . Smokeless tobacco: Never Used  Substance and Sexual Activity  . Alcohol use: Yes    Comment: Socially  . Drug use: Not on file  . Sexual activity: Yes  Lifestyle  . Physical activity:    Days per week: Not on file    Minutes per session: Not on file  . Stress: Not on file  Relationships  . Social connections:    Talks on phone: Not on file    Gets together: Not on file    Attends religious service: Not on file    Active member of club or organization: Not on file    Attends meetings of clubs or organizations: Not on file    Relationship status: Not on file  Other Topics Concern  . Not on file  Social History Narrative  . Not on file     Family History:  The patient's family history includes Breast cancer in his maternal grandmother; Cancer in his father; Depression in his mother; Heart disease in his maternal grandfather; Hyperlipidemia in his mother; Kidney disease in his father.   ROS:   Please see the history of present illness.    ROS All other systems reviewed and are negative.   PHYSICAL EXAM:   VS:  BP 132/90   Pulse 61   Ht 5' 5" (1.651 m)   Wt 180 lb 9.6 oz (81.9 kg)   BMI 30.05 kg/m    GEN: Well nourished, well developed, in no acute distress  HEENT: normal  Neck: no JVD, carotid bruits, or masses Cardiac: RRR; no murmurs, rubs, or gallops,no edema  Respiratory:  clear to auscultation bilaterally, normal work of breathing GI: soft, nontender, nondistended, + BS MS: no deformity or atrophy  Skin: warm and dry, no rash Neuro:  Alert and Oriented x 3, Strength and sensation are intact Psych: euthymic mood, full affect  Wt Readings from Last 3 Encounters:  12/04/18 180 lb 9.6 oz (81.9 kg)  11/19/18 179 lb (81.2 kg)  02/26/18 176 lb (79.8 kg)      Studies/Labs Reviewed:   EKG:  EKG is ordered today.  The ekg ordered today demonstrates normal sinus rhythm without significant ST-T wave changes  Recent Labs: 12/04/2018:  BUN 15; Creatinine, Ser 1.09; Hemoglobin 15.5; Platelets 209; Potassium 4.8; Sodium 144   Lipid Panel No results found for: CHOL, TRIG, HDL, CHOLHDL, VLDL, LDLCALC, LDLDIRECT  Additional studies/ records that were reviewed today include:   Echo 11/14/2016 LV EF: 55% -   60%  ------------------------------------------------------------------- Indications:      Palpitations 785.1.  ------------------------------------------------------------------- History:   PMH:  Sleep apnea on CPAP, Murmur,  ------------------------------------------------------------------- Study Conclusions  - Left ventricle: The cavity size was normal. Wall thickness was   increased in a pattern of mild LVH. Systolic function was normal.     The estimated ejection fraction was in the range of 55% to 60%.   Wall motion was normal; there were no regional wall motion   abnormalities. Doppler parameters are consistent with abnormal   left ventricular relaxation (grade 1 diastolic dysfunction). - Aortic valve: Mildly calcified annulus. Possibly bicuspid;   moderately calcified leaflets. There was moderate stenosis. There   was trivial regurgitation. Mean gradient (S): 14 mm Hg. Peak   gradient (S): 26 mm Hg. VTI ratio of LVOT to aortic valve: 0.39.   Valve area (VTI): 1.24 cm^2. - Aortic root: The aortic root was mildly dilated. - Ascending aorta: The ascending aorta was mildly dilated. - Mitral valve: There was trivial regurgitation. - Right atrium: Central venous pressure (est): 3 mm Hg. - Tricuspid valve: There was trivial regurgitation. - Pulmonary arteries: PA peak pressure: 21 mm Hg (S). - Pericardium, extracardiac: There was no pericardial effusion.  Impressions:  - Mild LVH with LVEF 55-60% and grade 1 diastolic dysfunction.   Trivial mitral regurgitation. Aortic valve looks to be bicuspid   and moderately calcified with evidence of overall moderate aortic   stenosis and trivial aortic  regurgitation as outlined above.   Aortic root and ascending aorta are mildly dilated. Trivial   tricuspid regurgitation with PASP estimated 21 mmHg.    Echo 10/06/2018 LV EF: 60% -   65% Study Conclusions  - Left ventricle: The cavity size was normal. Wall thickness was   increased in a pattern of mild LVH. Systolic function was normal.   The estimated ejection fraction was in the range of 60% to 65%.   Wall motion was normal; there were no regional wall motion   abnormalities. Doppler parameters are consistent with abnormal   left ventricular relaxation (grade 1 diastolic dysfunction).   Doppler parameters are consistent with high ventricular filling   pressure. - Aortic valve: Severely calcified leaflets. There was trivial   regurgitation. Morphologically, the valve stenosis severity   appears worse than measured parameters. There is at least   moderate calcific aortic stenosis. Valve area (VTI): 1 cm^2.   Valve area (Vmax): 0.85 cm^2. Valve area (Vmean): 0.91 cm^2. - Aorta: Moderately dilated ascending thoracic aorta (4.57 cm in   maximal diameter). - Right ventricle: Systolic function was reduced.  Impressions:  - Consider TEE if deemed clinically indicated.   ASSESSMENT:    1. Preoperative clearance   2. Bicuspid aortic valve   3. Nonrheumatic aortic valve stenosis   4. Hypothyroidism, unspecified type   5. OSA (obstructive sleep apnea)      PLAN:  In order of problems listed above:  1. Preoperative clearance: Patient is scheduled to undergo Bentall procedure and bioprosthetic AVR for thoracic aortic aneurysm and bicuspid aortic valve.  Dr. Bartle recommended cardiac catheterization prior to the surgery.  Obtain CBC and basic metabolic panel  - Risk and benefit of procedure explained to the patient who display clear understanding and agree to proceed.  Discussed with patient possible procedural risk include bleeding, vascular injury, renal injury, arrythmia,  MI, stroke and loss of limb or life.  2. Aortic stenosis with thoracic aortic aneurysm: Planning for Bentall procedure plus bioprosthetic aortic valve replacement  3. Hypothyroidism: Managed by primary care provider     Medication Adjustments/Labs and Tests Ordered: Current medicines are reviewed at length with the patient today.  Concerns regarding medicines are outlined above.  Medication changes, Labs and Tests ordered today are listed in the Patient Instructions below. Patient Instructions  Medication Instructions:    None If you need a refill on your cardiac medications before your next appointment, please call your pharmacy.   Lab work: BMET, CBC  If you have labs (blood work) drawn today and your tests are completely normal, you will receive your results only by: . MyChart Message (if you have MyChart) OR . A paper copy in the mail If you have any lab test that is abnormal or we need to change your treatment, we will call you to review the results.  Testing/Procedures: Your physician has requested that you have a cardiac catheterization. Cardiac catheterization is used to diagnose and/or treat various heart conditions. Doctors may recommend this procedure for a number of different reasons. The most common reason is to evaluate chest pain. Chest pain can be a symptom of coronary artery disease (CAD), and cardiac catheterization can show whether plaque is narrowing or blocking your heart's arteries. This procedure is also used to evaluate the valves, as well as measure the blood flow and oxygen levels in different parts of your heart. For further information please visit [email or web address]. Please follow instruction sheet, as given.   Follow-Up: At [facility], you and your health needs are our priority.  As part of our continuing mission to provide you with exceptional heart care, we have created designated Provider Care Teams.  These Care Teams include your primary Cardiologist  (physician) and Advanced Practice Providers (APPs -  Physician Assistants and Nurse Practitioners) who all work together to provide you with the care you need, when you need it. . Follow up appointment with Gianelle Mccaul, PA-C in late January . New patient appointment with Dr.Hochrein in late February    Any Other Special Instructions Will Be Listed Below (If Applicable).        [facility] [address] [place] [address] Dept: [phone] Loc: [phone]  Cameron [name]  12/04/2018  You are scheduled for a Cardiac Catheterization on Tuesday, December 17 with Dr. Jayadeep Varanasi.  1. Please arrive at the [facility] (Main Entrance A) at [facility]: [address] [place], [place] [address] at 8:30 AM (This time is two hours before your procedure to ensure your preparation). Free valet parking service is available.   Special note: Every effort is made to have your procedure done on time. Please understand that emergencies sometimes delay scheduled procedures.  2. Diet: Do not eat solid foods after midnight.  The patient may have clear liquids until 5am upon the day of the procedure.  3. Labs: Lab work today in office, CBC, BMET   4. Medication instructions in preparation for your procedure:   Contrast Allergy: No   On the morning of your procedure, take your Aspirin and any morning medicines NOT listed above.  You may use sips of water.  5. Plan for one night stay--bring personal belongings. 6. Bring a current list of your medications and current insurance cards. 7. You MUST have a responsible person to drive you home. 8. Someone MUST be with you the first 24 hours after you arrive home or your discharge will be delayed. 9. Please wear clothes that are easy to get on and off and wear slip-on shoes.  Thank you for allowing us to care for you!   -- [facility]       Signed, Jakobi Thetford, PA  12/06/2018 11:48 PM    [facility] [address], [place], [place]  [address] Phone: [phone]; Fax: [phone]   

## 2018-12-04 NOTE — Patient Instructions (Addendum)
Medication Instructions:  None If you need a refill on your cardiac medications before your next appointment, please call your pharmacy.   Lab work: BMET, CBC  If you have labs (blood work) drawn today and your tests are completely normal, you will receive your results only by: Marland Kitchen. MyChart Message (if you have MyChart) OR . A paper copy in the mail If you have any lab test that is abnormal or we need to change your treatment, we will call you to review the results.  Testing/Procedures: Your physician has requested that you have a cardiac catheterization. Cardiac catheterization is used to diagnose and/or treat various heart conditions. Doctors may recommend this procedure for a number of different reasons. The most common reason is to evaluate chest pain. Chest pain can be a symptom of coronary artery disease (CAD), and cardiac catheterization can show whether plaque is narrowing or blocking your heart's arteries. This procedure is also used to evaluate the valves, as well as measure the blood flow and oxygen levels in different parts of your heart. For further information please visit [email or web address]. Please follow instruction sheet, as given.   Follow-Up: At [facility], you and your health needs are our priority.  As part of our continuing mission to provide you with exceptional heart care, we have created designated Provider Care Teams.  These Care Teams include your primary Cardiologist (physician) and Advanced Practice Providers (APPs -  Physician Assistants and Nurse Practitioners) who all work together to provide you with the care you need, when you need it. . Follow up appointment with Azalee CourseHao Meng, PA-C in late January . New patient appointment with Dr.Hochrein in late February    Any Other Special Instructions Will Be Listed Below (If Applicable).        [facility] [address] [place] [id number] Dept: [phone] Loc: [phone]  Cameron [name]  12/04/2018  You are scheduled for a Cardiac Catheterization on Tuesday, December 17 with Dr. Lance MussJayadeep Varanasi.  1. [facility] : [address] [place], [place] [address] at 8:30 AM (This time is two hours before your procedure to ensure your preparation). Free valet parking service is available.   Special note: Every effort is made to have your procedure done on time. Please understand that emergencies sometimes delay scheduled procedures.  2. Diet: Do not eat solid foods after midnight.  The patient may have clear liquids until 5am upon the day of the procedure.  3. Labs: Lab work today in office, CBC, BMET   4. Medication instructions in preparation for your procedure:   Contrast Allergy: No   On the morning of your procedure, take your Aspirin and any morning medicines NOT listed above.  You may use sips of water.  5. Plan for one night stay--bring personal belongings. 6. Bring a current list of your medications and current insurance cards. 7. You MUST have a responsible person to drive you home. 8. Someone MUST be with you the first 24 hours after you arrive home or your discharge will be delayed. 9. Please wear clothes that are easy to get on and off and wear slip-on shoes.  Thank you for allowing us to care for you!   -- [facility]

## 2018-12-04 NOTE — Progress Notes (Signed)
Cardiology Office Note    Date:  12/06/2018   ID:  Cameron [name], DOB 07-05-1956, MRN [id number]  PCP:  Kari Baars, MD  Cardiologist: Dr. Antoine Poche (previously Dr. Diona Browner)  No chief complaint on file.   History of Present Illness:  Cameron [name] is a 62 y.o. male with past medical history of hypothyroidism, bicuspid aortic valve, kidney stone, obstructive sleep apnea on CPAP.  Patient was previously seen by Dr. Diona Browner in November 2017 for evaluation of palpitation.  Cardiac monitor at the time showed sinus rhythm with episodes of regular SVT associated with aberrant conduction and the retrograde P waves.  There was no definitive atrial fibrillation.  He also has longstanding history of heart murmur.  Previous echocardiogram obtained on 11/14/2016 showed EF 55 to 60%, grade 1 DD, moderate aortic stenosis.  Repeat echocardiogram obtained in January 2019 showed EF 60 to 65%, severely thickened aortic valve with mild to moderate stenosis, however morphologically looks worse than measured parameters.  Most recent echocardiogram obtained on 10/06/2018 showed EF 60 to 65%, grade 1 DD, at least moderate aortic stenosis, moderately dilated ascending aorta measuring 4.57 cm.  He has been followed as outpatient by Dr. Laneta Simmers.  Recently in the past 6 months, he had at least 6 episodes of dizziness and blurred vision.  He also continue to complain of exertional dyspnea.  He was seen by Dr. Laneta Simmers who recommended him to proceed with dental procedure with bioprosthetic AVR.  He will need cardiac catheterization prior to the procedure.  Patient presented to [facility] clinic as he prefer to be followed in [place]. I have discussed the case with DOD Dr. Antoine Poche who plan to take the patient on.     Past Medical History:  Diagnosis Date  . Arthritis   . GERD (gastroesophageal reflux disease)   . Headache   . History of heart murmur in childhood   . Hyperthyroidism   . Impaired glucose  tolerance   . Nephrolithiasis   . Sleep apnea    On CPAP    Past Surgical History:  Procedure Laterality Date  . CARDIAC CATHETERIZATION  1990s  . CYSTOSCOPY  1990s   With stone extraction  . CYSTOSCOPY W/ URETERAL STENT PLACEMENT Left 12/02/2015   Procedure: CYSTOSCOPY WITH RETROGRADE PYELOGRAM/URETERAL STENT PLACEMENT;  Surgeon: Malen Gauze, MD;  Location: [facility];  Service: Urology;  Laterality: Left;  . ESOPHAGOGASTRODUODENOSCOPY  1990s  . KNEE ARTHROSCOPY Left 2005  . LITHOTRIPSY  1990s  . LITHOTRIPSY Left 11/2015  . UMBILICAL HERNIA REPAIR  late 1990s    Current Medications: Outpatient Medications Prior to Visit  Medication Sig Dispense Refill  . metoprolol succinate (TOPROL XL) 25 MG 24 hr tablet Take 1 tablet (25 mg total) by mouth daily. 90 tablet 3  . promethazine (PHENERGAN) 25 MG tablet Take 25 mg by mouth every 6 (six) hours as needed for nausea or vomiting.    . tamsulosin (FLOMAX) 0.4 MG CAPS capsule Take 1 capsule (0.4 mg total) by mouth daily after supper. 30 capsule 0  . traMADol (ULTRAM) 50 MG tablet Take 50-100 mg by mouth every 4 (four) hours as needed (for arthritis pain.).     Marland Kitchen Vitamin D, Cholecalciferol, 1000 units CAPS Take 1,000 mg by mouth 2 (two) times daily.    . methimazole (TAPAZOLE) 5 MG tablet Take 2.5 mg by mouth every other day.      No facility-administered medications prior to visit.      Allergies:  Demerol [meperidine] and Stadol [butorphanol]   Social History   Socioeconomic History  . Marital status: Married    Spouse name: Not on file  . Number of children: Not on file  . Years of education: Not on file  . Highest education level: Not on file  Occupational History  . Not on file  Social Needs  . Financial resource strain: Not on file  . Food insecurity:    Worry: Not on file    Inability: Not on file  . Transportation needs:    Medical: Not on file    Non-medical: Not on file  Tobacco Use  . Smoking status: Passive  Smoke Exposure - Never Smoker  . Smokeless tobacco: Never Used  Substance and Sexual Activity  . Alcohol use: Yes    Comment: Socially  . Drug use: Not on file  . Sexual activity: Yes  Lifestyle  . Physical activity:    Days per week: Not on file    Minutes per session: Not on file  . Stress: Not on file  Relationships  . Social connections:    Talks on phone: Not on file    Gets together: Not on file    Attends religious service: Not on file    Active member of club or organization: Not on file    Attends meetings of clubs or organizations: Not on file    Relationship status: Not on file  Other Topics Concern  . Not on file  Social History Narrative  . Not on file     Family History:  The patient's family history includes Breast cancer in his maternal grandmother; Cancer in his father; Depression in his mother; Heart disease in his maternal grandfather; Hyperlipidemia in his mother; Kidney disease in his father.   ROS:   Please see the history of present illness.    ROS All other systems reviewed and are negative.   PHYSICAL EXAM:   VS:  BP 132/90   Pulse 61   Ht 5\' 5"  (1.651 m)   Wt 180 lb 9.6 oz (81.9 kg)   BMI 30.05 kg/m    GEN: Well nourished, well developed, in no acute distress  HEENT: normal  Neck: no JVD, carotid bruits, or masses Cardiac: RRR; no murmurs, rubs, or gallops,no edema  Respiratory:  clear to auscultation bilaterally, normal work of breathing GI: soft, nontender, nondistended, + BS MS: no deformity or atrophy  Skin: warm and dry, no rash Neuro:  Alert and Oriented x 3, Strength and sensation are intact Psych: euthymic mood, full affect  Wt Readings from Last 3 Encounters:  12/04/18 180 lb 9.6 oz (81.9 kg)  11/19/18 179 lb (81.2 kg)  02/26/18 176 lb (79.8 kg)      Studies/Labs Reviewed:   EKG:  EKG is ordered today.  The ekg ordered today demonstrates normal sinus rhythm without significant ST-T wave changes  Recent Labs: 12/04/2018:  BUN 15; Creatinine, Ser 1.09; Hemoglobin 15.5; Platelets 209; Potassium 4.8; Sodium 144   Lipid Panel No results found for: CHOL, TRIG, HDL, CHOLHDL, VLDL, LDLCALC, LDLDIRECT  Additional studies/ records that were reviewed today include:   Echo 11/14/2016 LV EF: 55% -   60%  ------------------------------------------------------------------- Indications:      Palpitations 785.1.  ------------------------------------------------------------------- History:   PMH:  Sleep apnea on CPAP, Murmur,  ------------------------------------------------------------------- Study Conclusions  - Left ventricle: The cavity size was normal. Wall thickness was   increased in a pattern of mild LVH. Systolic function was normal.  The estimated ejection fraction was in the range of 55% to 60%.   Wall motion was normal; there were no regional wall motion   abnormalities. Doppler parameters are consistent with abnormal   left ventricular relaxation (grade 1 diastolic dysfunction). - Aortic valve: Mildly calcified annulus. Possibly bicuspid;   moderately calcified leaflets. There was moderate stenosis. There   was trivial regurgitation. Mean gradient (S): 14 mm Hg. Peak   gradient (S): 26 mm Hg. VTI ratio of LVOT to aortic valve: 0.39.   Valve area (VTI): 1.24 cm^2. - Aortic root: The aortic root was mildly dilated. - Ascending aorta: The ascending aorta was mildly dilated. - Mitral valve: There was trivial regurgitation. - Right atrium: Central venous pressure (est): 3 mm Hg. - Tricuspid valve: There was trivial regurgitation. - Pulmonary arteries: PA peak pressure: 21 mm Hg (S). - Pericardium, extracardiac: There was no pericardial effusion.  Impressions:  - Mild LVH with LVEF 55-60% and grade 1 diastolic dysfunction.   Trivial mitral regurgitation. Aortic valve looks to be bicuspid   and moderately calcified with evidence of overall moderate aortic   stenosis and trivial aortic  regurgitation as outlined above.   Aortic root and ascending aorta are mildly dilated. Trivial   tricuspid regurgitation with PASP estimated 21 mmHg.    Echo 10/06/2018 LV EF: 60% -   65% Study Conclusions  - Left ventricle: The cavity size was normal. Wall thickness was   increased in a pattern of mild LVH. Systolic function was normal.   The estimated ejection fraction was in the range of 60% to 65%.   Wall motion was normal; there were no regional wall motion   abnormalities. Doppler parameters are consistent with abnormal   left ventricular relaxation (grade 1 diastolic dysfunction).   Doppler parameters are consistent with high ventricular filling   pressure. - Aortic valve: Severely calcified leaflets. There was trivial   regurgitation. Morphologically, the valve stenosis severity   appears worse than measured parameters. There is at least   moderate calcific aortic stenosis. Valve area (VTI): 1 cm^2.   Valve area (Vmax): 0.85 cm^2. Valve area (Vmean): 0.91 cm^2. - Aorta: Moderately dilated ascending thoracic aorta (4.57 cm in   maximal diameter). - Right ventricle: Systolic function was reduced.  Impressions:  - Consider TEE if deemed clinically indicated.   ASSESSMENT:    1. Preoperative clearance   2. Bicuspid aortic valve   3. Nonrheumatic aortic valve stenosis   4. Hypothyroidism, unspecified type   5. OSA (obstructive sleep apnea)      PLAN:  In order of problems listed above:  1. Preoperative clearance: Patient is scheduled to undergo Bentall procedure and bioprosthetic AVR for thoracic aortic aneurysm and bicuspid aortic valve.  Dr. Laneta Simmers recommended cardiac catheterization prior to the surgery.  Obtain CBC and basic metabolic panel  - Risk and benefit of procedure explained to the patient who display clear understanding and agree to proceed.  Discussed with patient possible procedural risk include bleeding, vascular injury, renal injury, arrythmia,  MI, stroke and loss of limb or life.  2. Aortic stenosis with thoracic aortic aneurysm: Planning for Bentall procedure plus bioprosthetic aortic valve replacement  3. Hypothyroidism: Managed by primary care provider     Medication Adjustments/Labs and Tests Ordered: Current medicines are reviewed at length with the patient today.  Concerns regarding medicines are outlined above.  Medication changes, Labs and Tests ordered today are listed in the Patient Instructions below. Patient Instructions  Medication Instructions:  None If you need a refill on your cardiac medications before your next appointment, please call your pharmacy.   Lab work: BMET, CBC  If you have labs (blood work) drawn today and your tests are completely normal, you will receive your results only by: Marland Kitchen MyChart Message (if you have MyChart) OR . A paper copy in the mail If you have any lab test that is abnormal or we need to change your treatment, we will call you to review the results.  Testing/Procedures: Your physician has requested that you have a cardiac catheterization. Cardiac catheterization is used to diagnose and/or treat various heart conditions. Doctors may recommend this procedure for a number of different reasons. The most common reason is to evaluate chest pain. Chest pain can be a symptom of coronary artery disease (CAD), and cardiac catheterization can show whether plaque is narrowing or blocking your heart's arteries. This procedure is also used to evaluate the valves, as well as measure the blood flow and oxygen levels in different parts of your heart. For further information please visit [email or web address] Please follow instruction sheet, as given.   Follow-Up: At [facility], you and your health needs are our priority.  As part of our continuing mission to provide you with exceptional heart care, we have created designated Provider Care Teams.  These Care Teams include your primary Cardiologist  (physician) and Advanced Practice Providers (APPs -  Physician Assistants and Nurse Practitioners) who all work together to provide you with the care you need, when you need it. . Follow up appointment with Azalee Course, PA-C in late January . New patient appointment with Dr.Hochrein in late February    Any Other Special Instructions Will Be Listed Below (If Applicable).        [facility] CARDIOVASCULAR DIVISION [facility] [address] [place] [address] [place] [address] Dept: [phone] Loc: [phone]  Cameron [name]  12/04/2018  You are scheduled for a Cardiac Catheterization on Tuesday, December 17 with Dr. Lance Muss.  1. Please arrive at the [facility] (Main Entrance A) at [facility]: [address] [place], [place] [address] at 8:30 AM (This time is two hours before your procedure to ensure your preparation). Free valet parking service is available.   Special note: Every effort is made to have your procedure done on time. Please understand that emergencies sometimes delay scheduled procedures.  2. Diet: Do not eat solid foods after midnight.  The patient may have clear liquids until 5am upon the day of the procedure.  3. Labs: Lab work today in office, CBC, BMET   4. Medication instructions in preparation for your procedure:   Contrast Allergy: No   On the morning of your procedure, take your Aspirin and any morning medicines NOT listed above.  You may use sips of water.  5. Plan for one night stay--bring personal belongings. 6. Bring a current list of your medications and current insurance cards. 7. You MUST have a responsible person to drive you home. 8. Someone MUST be with you the first 24 hours after you arrive home or your discharge will be delayed. 9. Please wear clothes that are easy to get on and off and wear slip-on shoes.  Thank you for allowing [place] to care for you!   -- [facility] Invasive  Cardiovascular services       Signed, Azalee Course, [place]  12/06/2018 11:48 PM    [facility] [address]  [address] [place], [place]  [address] Phone: [phone]; Fax: [phone]

## 2018-12-05 LAB — BASIC METABOLIC PANEL
BUN/Creatinine Ratio: 14 (ref 10–24)
BUN: 15 mg/dL (ref 8–27)
CO2: 24 mmol/L (ref 20–29)
Calcium: 9.2 mg/dL (ref 8.6–10.2)
Chloride: 104 mmol/L (ref 96–106)
Creatinine, Ser: 1.09 mg/dL (ref 0.76–1.27)
GFR calc Af Amer: 85 mL/min/{1.73_m2} (ref >59)
GFR, EST NON AFRICAN AMERICAN: 73 mL/min/{1.73_m2} (ref >59)
Glucose: 80 mg/dL (ref 65–99)
POTASSIUM: 4.8 mmol/L (ref 3.5–5.2)
Sodium: 144 mmol/L (ref 134–144)

## 2018-12-05 LAB — CBC
Hematocrit: 45.1 % (ref 37.5–51.0)
Hemoglobin: 15.5 g/dL (ref 13.0–17.7)
MCH: 30.2 pg (ref 26.6–33.0)
MCHC: 34.4 g/dL (ref 31.5–35.7)
MCV: 88 fL (ref 79–97)
Platelets: 209 10*3/uL (ref 150–450)
RBC: 5.14 x10E6/uL (ref 4.14–5.80)
RDW: 13.2 % (ref 12.3–15.4)
WBC: 7.8 10*3/uL (ref 3.4–10.8)

## 2018-12-06 ENCOUNTER — Encounter: Payer: Self-pay | Admitting: Physician Assistant

## 2018-12-07 ENCOUNTER — Other Ambulatory Visit: Payer: Self-pay | Admitting: Physician Assistant

## 2018-12-08 ENCOUNTER — Telehealth: Payer: Self-pay | Admitting: *Deleted

## 2018-12-08 NOTE — Telephone Encounter (Signed)
Pt contacted pre-catheterization scheduled at [facility] for: Tuesday December 09, 2018 10:30 AM Verified arrival time and place: [facility] Main Entr[facility] A at: 8 AM  No solid food after midnight prior to cath, clear liquids until 5 AM day of procedure. Contrast allergy: no   AM meds can be  taken pre-cath with sip of water including: ASA 81 mg  Confirmed patient has responsible person to drive home post procedure and for 24 hours after you arrive home: yes

## 2018-12-09 ENCOUNTER — Ambulatory Visit (HOSPITAL_COMMUNITY)
Admission: RE | Admit: 2018-12-09 | Discharge: 2018-12-09 | Disposition: A | Discharge status: Home / Self Care | Payer: Commercial Managed Care - PPO | Attending: Interventional Cardiology | Admitting: Interventional Cardiology

## 2018-12-09 ENCOUNTER — Encounter (HOSPITAL_COMMUNITY)
Admission: RE | Disposition: A | Discharge status: Home / Self Care | Payer: Self-pay | Source: Home / Self Care | Attending: Interventional Cardiology

## 2018-12-09 DIAGNOSIS — K219 Gastro-esophageal reflux disease without esophagitis: Secondary | ICD-10-CM | POA: Insufficient documentation

## 2018-12-09 DIAGNOSIS — Z7722 Contact with and (suspected) exposure to environmental tobacco smoke (acute) (chronic): Secondary | ICD-10-CM | POA: Diagnosis not present

## 2018-12-09 DIAGNOSIS — M1990 Unspecified osteoarthritis, unspecified site: Secondary | ICD-10-CM | POA: Diagnosis not present

## 2018-12-09 DIAGNOSIS — I350 Nonrheumatic aortic (valve) stenosis: Secondary | ICD-10-CM | POA: Diagnosis not present

## 2018-12-09 DIAGNOSIS — Z955 Presence of coronary angioplasty implant and graft: Secondary | ICD-10-CM | POA: Insufficient documentation

## 2018-12-09 DIAGNOSIS — Z01810 Encounter for preprocedural cardiovascular examination: Secondary | ICD-10-CM

## 2018-12-09 DIAGNOSIS — Z8249 Family history of ischemic heart disease and other diseases of the circulatory system: Secondary | ICD-10-CM | POA: Insufficient documentation

## 2018-12-09 DIAGNOSIS — E039 Hypothyroidism, unspecified: Secondary | ICD-10-CM | POA: Diagnosis not present

## 2018-12-09 DIAGNOSIS — Z885 Allergy status to narcotic agent status: Secondary | ICD-10-CM | POA: Diagnosis not present

## 2018-12-09 DIAGNOSIS — Z79899 Other long term (current) drug therapy: Secondary | ICD-10-CM | POA: Insufficient documentation

## 2018-12-09 DIAGNOSIS — I700 Atherosclerosis of aorta: Secondary | ICD-10-CM | POA: Diagnosis not present

## 2018-12-09 DIAGNOSIS — R7302 Impaired glucose tolerance (oral): Secondary | ICD-10-CM | POA: Insufficient documentation

## 2018-12-09 DIAGNOSIS — G4733 Obstructive sleep apnea (adult) (pediatric): Secondary | ICD-10-CM | POA: Insufficient documentation

## 2018-12-09 HISTORY — PX: ULTRASOUND GUIDANCE FOR VASCULAR ACCESS: SHX6516

## 2018-12-09 HISTORY — PX: RIGHT/LEFT HEART CATH AND CORONARY ANGIOGRAPHY: CATH118266

## 2018-12-09 LAB — POCT I-STAT 3, ART BLOOD GAS (G3+)
Acid-Base Excess: 1 mmol/L (ref 0.0–2.0)
Bicarbonate: 26 mmol/L (ref 20.0–28.0)
O2 Saturation: 99 %
TCO2: 27 mmol/L (ref 22–32)
pCO2 arterial: 42.7 mmHg (ref 32.0–48.0)
pH, Arterial: 7.392 (ref 7.350–7.450)
pO2, Arterial: 127 mmHg — ABNORMAL HIGH (ref 83.0–108.0)

## 2018-12-09 LAB — POCT I-STAT 3, VENOUS BLOOD GAS (G3P V)
ACID-BASE EXCESS: 1 mmol/L (ref 0.0–2.0)
Bicarbonate: 26.7 mmol/L (ref 20.0–28.0)
O2 Saturation: 77 %
PCO2 VEN: 45.6 mmHg (ref 44.0–60.0)
PO2 VEN: 43 mmHg (ref 32.0–45.0)
TCO2: 28 mmol/L (ref 22–32)
pH, Ven: 7.375 (ref 7.250–7.430)

## 2018-12-09 SURGERY — RIGHT/LEFT HEART CATH AND CORONARY ANGIOGRAPHY
Anesthesia: LOCAL

## 2018-12-09 MED ORDER — LIDOCAINE HCL (PF) 1 % IJ SOLN
INTRAMUSCULAR | Status: AC
  Filled 2018-12-09: qty 30

## 2018-12-09 MED ORDER — HEPARIN (PORCINE) IN NACL 1000-0.9 UT/500ML-% IV SOLN
INTRAVENOUS | Status: AC
  Filled 2018-12-09: qty 1000

## 2018-12-09 MED ORDER — MIDAZOLAM HCL 2 MG/2ML IJ SOLN
INTRAMUSCULAR | Status: DC | PRN
  Administered 2018-12-09: 2 mg via INTRAVENOUS
  Administered 2018-12-09: 1 mg via INTRAVENOUS

## 2018-12-09 MED ORDER — SODIUM CHLORIDE 0.9 % IV SOLN: 250.0000 mL | INTRAVENOUS | Status: DC | PRN

## 2018-12-09 MED ORDER — SODIUM CHLORIDE 0.9 % IV SOLN: INTRAVENOUS | Status: DC

## 2018-12-09 MED ORDER — SODIUM CHLORIDE 0.9% FLUSH: 3.0000 mL | INTRAVENOUS | Status: DC | PRN

## 2018-12-09 MED ORDER — HEPARIN (PORCINE) IN NACL 1000-0.9 UT/500ML-% IV SOLN
INTRAVENOUS | Status: DC | PRN
  Administered 2018-12-09 (×2): 500 mL

## 2018-12-09 MED ORDER — IOHEXOL 350 MG/ML SOLN
INTRAVENOUS | Status: DC | PRN
  Administered 2018-12-09: 55 mL via INTRAVENOUS

## 2018-12-09 MED ORDER — ONDANSETRON HCL 4 MG/2ML IJ SOLN: 4.0000 mg | Freq: Four times a day (QID) | INTRAMUSCULAR | Status: DC | PRN

## 2018-12-09 MED ORDER — MIDAZOLAM HCL 2 MG/2ML IJ SOLN
INTRAMUSCULAR | Status: AC
  Filled 2018-12-09: qty 2

## 2018-12-09 MED ORDER — SODIUM CHLORIDE 0.9 % WEIGHT BASED INFUSION
3.0000 mL/kg/h | INTRAVENOUS | Status: AC
  Administered 2018-12-09: 3 mL/kg/h via INTRAVENOUS

## 2018-12-09 MED ORDER — VERAPAMIL HCL 2.5 MG/ML IV SOLN
INTRAVENOUS | Status: AC
  Filled 2018-12-09: qty 2

## 2018-12-09 MED ORDER — FENTANYL CITRATE (PF) 100 MCG/2ML IJ SOLN
INTRAMUSCULAR | Status: DC | PRN
  Administered 2018-12-09 (×2): 25 ug via INTRAVENOUS

## 2018-12-09 MED ORDER — ASPIRIN 81 MG PO CHEW: 81.0000 mg | CHEWABLE_TABLET | ORAL | Status: DC

## 2018-12-09 MED ORDER — ACETAMINOPHEN 325 MG PO TABS
650.0000 mg | ORAL_TABLET | ORAL | Status: DC | PRN
  Administered 2018-12-09: 650 mg via ORAL
  Filled 2018-12-09: qty 2

## 2018-12-09 MED ORDER — VERAPAMIL HCL 2.5 MG/ML IV SOLN
INTRAVENOUS | Status: DC | PRN
  Administered 2018-12-09: 10 mL via INTRA_ARTERIAL

## 2018-12-09 MED ORDER — FENTANYL CITRATE (PF) 100 MCG/2ML IJ SOLN
INTRAMUSCULAR | Status: AC
  Filled 2018-12-09: qty 2

## 2018-12-09 MED ORDER — HEPARIN SODIUM (PORCINE) 1000 UNIT/ML IJ SOLN
INTRAMUSCULAR | Status: DC | PRN
  Administered 2018-12-09: 4000 [IU] via INTRAVENOUS

## 2018-12-09 MED ORDER — SODIUM CHLORIDE 0.9 % WEIGHT BASED INFUSION: 1.0000 mL/kg/h | INTRAVENOUS | Status: DC

## 2018-12-09 MED ORDER — HEPARIN SODIUM (PORCINE) 1000 UNIT/ML IJ SOLN
INTRAMUSCULAR | Status: AC
  Filled 2018-12-09: qty 1

## 2018-12-09 MED ORDER — SODIUM CHLORIDE 0.9% FLUSH: 3.0000 mL | Freq: Two times a day (BID) | INTRAVENOUS | Status: DC

## 2018-12-09 MED ORDER — LIDOCAINE HCL (PF) 1 % IJ SOLN
INTRAMUSCULAR | Status: DC | PRN
  Administered 2018-12-09 (×2): 2 mL

## 2018-12-09 SURGICAL SUPPLY — 15 items
CATH 5FR JL3.5 JR4 ANG PIG MP (CATHETERS) ×3 IMPLANT
CATH BALLN WEDGE 5F 110CM (CATHETERS) ×3 IMPLANT
CATH INFINITI 5 FR 3DRC (CATHETERS) ×3 IMPLANT
CATH INFINITI 5FR JL4 (CATHETERS) ×3 IMPLANT
DEVICE RAD COMP TR BAND LRG (VASCULAR PRODUCTS) ×3 IMPLANT
GLIDESHEATH SLEND SS 6F .021 (SHEATH) ×3 IMPLANT
GUIDEWIRE INQWIRE 1.5J.035X260 (WIRE) ×2 IMPLANT
INQWIRE 1.5J .035X260CM (WIRE) ×3
KIT HEART LEFT (KITS) ×3 IMPLANT
PACK CARDIAC CATHETERIZATION (CUSTOM PROCEDURE TRAY) ×3 IMPLANT
SHEATH GLIDE SLENDER 4/5FR (SHEATH) ×3 IMPLANT
SHEATH PROBE COVER 6X72 (BAG) ×3 IMPLANT
TRANSDUCER W/STOPCOCK (MISCELLANEOUS) ×3 IMPLANT
TUBING CIL FLEX 10 FLL-RA (TUBING) ×3 IMPLANT
WIRE EMERALD 3MM-J .025X260CM (WIRE) ×3 IMPLANT

## 2018-12-09 NOTE — Interval H&P Note (Signed)
Cath Lab Visit (complete for each Cath Lab visit)  Clinical Evaluation Leading to the Procedure:   ACS: No.  Non-ACS:    Anginal Classification: CCS III  Anti-ischemic medical therapy: Minimal Therapy (1 class of medications)  Non-Invasive Test Results: No non-invasive testing performed  Prior CABG: No previous CABG  Plan for AVR/Bentall procedure.  No PCI planned.    History and Physical Interval Note:  12/09/2018 12:34 PM  Cameron [name]  has presented today for surgery, with the diagnosis of pre-op clearance  The various methods of treatment have been discussed with the patient and family. After consideration of risks, benefits and other options for treatment, the patient has consented to  Procedure(s): LEFT HEART CATH AND CORONARY ANGIOGRAPHY (N/A) as a surgical intervention .  The patient's history has been reviewed, patient examined, no change in status, stable for surgery.  I have reviewed the patient's chart and labs.  Questions were answered to the patient's satisfaction.     Lance MussJayadeep Charmane Protzman

## 2018-12-09 NOTE — Progress Notes (Signed)
Ambulated to bathroom to void tol well  

## 2018-12-09 NOTE — Discharge Instructions (Signed)
Drink plenty of fluids.  °Keep right arm at or above heart level °Radial Site Care °Refer to this sheet in the next few weeks. These instructions provide you with information about caring for yourself after your procedure. Your health care provider may also give you more specific instructions. Your treatment has been planned according to current medical practices, but problems sometimes occur. Call your health care provider if you have any problems or questions after your procedure. °What can I expect after the procedure? °After your procedure, it is typical to have the following: °· Bruising at the radial site that usually fades within 1-2 weeks. °· Blood collecting in the tissue (hematoma) that may be painful to the touch. It should usually decrease in size and tenderness within 1-2 weeks. ° °Follow these instructions at home: °· Take medicines only as directed by your health care provider. °· You may shower 24-48 hours after the procedure or as directed by your health care provider. Remove the bandage (dressing) and gently wash the site with plain soap and water. Pat the area dry with a clean towel. Do not rub the site, because this may cause bleeding. °· Do not take baths, swim, or use a hot tub until your health care provider approves. °· Check your insertion site every day for redness, swelling, or drainage. °· Do not apply powder or lotion to the site. °· Do not flex or bend the affected arm for 24 hours or as directed by your health care provider. °· Do not push or pull heavy objects with the affected arm for 24 hours or as directed by your health care provider. °· Do not lift over 10 lb (4.5 kg) for 5 days after your procedure or as directed by your health care provider. °· Ask your health care provider when it is okay to: °? Return to work or school. °? Resume usual physical activities or sports. °? Resume sexual activity. °· Do not drive home if you are discharged the same day as the procedure. Have  someone else drive you. °· You may drive 24 hours after the procedure unless otherwise instructed by your health care provider. °· Do not operate machinery or power tools for 24 hours after the procedure. °· If your procedure was done as an outpatient procedure, which means that you went home the same day as your procedure, a responsible adult should be with you for the first 24 hours after you arrive home. °· Keep all follow-up visits as directed by your health care provider. This is important. °Contact a health care provider if: °· You have a fever. °· You have chills. °· You have increased bleeding from the radial site. Hold pressure on the site. °Get help right away if: °· You have unusual pain at the radial site. °· You have redness, warmth, or swelling at the radial site. °· You have drainage (other than a small amount of blood on the dressing) from the radial site. °· The radial site is bleeding, and the bleeding does not stop after 30 minutes of holding steady pressure on the site. °· Your arm or hand becomes pale, cool, tingly, or numb. °This information is not intended to replace advice given to you by your health care provider. Make sure you discuss any questions you have with your health care provider. °Document Released: 01/12/2011 Document Revised: 05/17/2016 Document Reviewed: 06/28/2014 °Elsevier Interactive Patient Education © 2018 Elsevier Inc. ° °

## 2018-12-10 ENCOUNTER — Encounter (HOSPITAL_COMMUNITY): Payer: Self-pay | Admitting: Interventional Cardiology

## 2018-12-18 NOTE — Pre-Procedure Instructions (Signed)
Delila SpenceCharles R Pollet  12/18/2018      [facility] - [place], [place] - [address] [place] [address] Phone: [phone] Fax: [phone]    Your procedure is scheduled on 12/22/2018.  [facility] Ad[facility] at 0530 A.M.  Call this number if you have problems the morning of surgery:  [phone]   Remember:  Do not eat or drink after midnight.     Take these medicines the morning of surgery with A SIP OF WATER: Acetaminophen (Tylenol) - if needed Metoprolol succinate (Toprol XL) Promethazine (Phenergan) - if needed Tramadol (Ultram) - if needed  7 days prior to surgery STOP taking any Aspirin (unless otherwise instructed by your surgeon), Aleve, Naproxen, Ibuprofen, Motrin, Advil, Goody's, BC's, all herbal medications, fish oil, and all vitamins.     Do not wear jewelry.  Do not wear lotions, powders, or colognes, or deodorant.  Men may shave face and neck.  Do not bring valuables to the hospital.  [facility] is not responsible for any belongings or valuables.  Contacts, eyeglasses, hearing aids, dentures or bridgework may not be worn into surgery.  Leave your suitcase in the car.  After surgery it may be brought to your room.  For patients admitted to the hospital, discharge time will be determined by your treatment team.  Patients discharged the day of surgery will not be allowed to drive home.   Name and phone number of your driver:    Special instructions:   [facility]- Preparing For Surgery  Before surgery, you can play an important role. Because skin is not sterile, your skin needs to be as free of germs as possible. You can reduce the number of germs on your skin by washing with CHG (chlorahexidine gluconate) Soap before surgery.  CHG is an antiseptic cleaner which kills germs and bonds with the skin to continue killing germs even after washing.    Oral Hygiene is also important to reduce your risk of  infection.  Remember - BRUSH YOUR TEETH THE MORNING OF SURGERY WITH YOUR REGULAR TOOTHPASTE  Please do not use if you have an allergy to CHG or antibacterial soaps. If your skin becomes reddened/irritated stop using the CHG.  Do not shave (including legs and underarms) for at least 48 hours prior to first CHG shower. It is OK to shave your face.  Please follow these instructions carefully.   1. Shower the NIGHT BEFORE SURGERY and the MORNING OF SURGERY with CHG.   2. If you chose to wash your hair, wash your hair first as usual with your normal shampoo.  3. After you shampoo, rinse your hair and body thoroughly to remove the shampoo.  4. Use CHG as you would any other liquid soap. You can apply CHG directly to the skin and wash gently with a scrungie or a clean washcloth.   5. Apply the CHG Soap to your body ONLY FROM THE NECK DOWN.  Do not use on open wounds or open sores. Avoid contact with your eyes, ears, mouth and genitals (private parts). Wash Face and genitals (private parts)  with your normal soap.  6. Wash thoroughly, paying special attention to the area where your surgery will be performed.  7. Thoroughly rinse your body with warm water from the neck down.  8. DO NOT shower/wash with your normal soap after using and rinsing off the CHG Soap.  9. Pat yourself dry with a CLEAN TOWEL.  10. Wear CLEAN PAJAMAS to bed the night before surgery, wear comfortable clothes the morning of surgery  11. Place CLEAN SHEETS on your bed the night of your first shower and DO NOT SLEEP WITH PETS.    Day of Surgery: Shower as stated above. Do not apply any deodorants/lotions.  Please wear clean clothes to the hospital/surgery center.   Remember to brush your teeth WITH YOUR REGULAR TOOTHPASTE.    Please read over the following fact sheets that you were given.

## 2018-12-19 ENCOUNTER — Encounter (HOSPITAL_COMMUNITY): Payer: Self-pay

## 2018-12-19 ENCOUNTER — Encounter (HOSPITAL_COMMUNITY)
Admission: RE | Admit: 2018-12-19 | Discharge: 2018-12-19 | Disposition: A | Discharge status: Home / Self Care | Payer: Commercial Managed Care - PPO | Source: Ambulatory Visit | Attending: Surgery | Admitting: Surgery

## 2018-12-19 ENCOUNTER — Ambulatory Visit (HOSPITAL_BASED_OUTPATIENT_CLINIC_OR_DEPARTMENT_OTHER)
Admission: RE | Admit: 2018-12-19 | Discharge: 2018-12-19 | Disposition: A | Discharge status: Home / Self Care | Payer: Commercial Managed Care - PPO | Source: Ambulatory Visit | Attending: Surgery | Admitting: Surgery

## 2018-12-19 ENCOUNTER — Other Ambulatory Visit: Payer: Self-pay

## 2018-12-19 DIAGNOSIS — I350 Nonrheumatic aortic (valve) stenosis: Secondary | ICD-10-CM

## 2018-12-19 DIAGNOSIS — I712 Thoracic aortic aneurysm, without rupture: Secondary | ICD-10-CM

## 2018-12-19 DIAGNOSIS — I7121 Aneurysm of the ascending aorta, without rupture: Secondary | ICD-10-CM

## 2018-12-19 HISTORY — DX: Nonrheumatic aortic (valve) stenosis: I35.0

## 2018-12-19 HISTORY — DX: Personal history of urinary calculi: Z87.442

## 2018-12-19 HISTORY — DX: Thoracic aortic aneurysm, without rupture: I71.2

## 2018-12-19 HISTORY — DX: Aneurysm of the ascending aorta, without rupture: I71.21

## 2018-12-19 LAB — COMPREHENSIVE METABOLIC PANEL
ALT: 22 U/L (ref 0–44)
AST: 20 U/L (ref 15–41)
Albumin: 4.2 g/dL (ref 3.5–5.0)
Alkaline Phosphatase: 38 U/L (ref 38–126)
Anion gap: 9 (ref 5–15)
BILIRUBIN TOTAL: 0.9 mg/dL (ref 0.3–1.2)
BUN: 9 mg/dL (ref 6–20)
CO2: 22 mmol/L (ref 22–32)
Calcium: 8.8 mg/dL — ABNORMAL LOW (ref 8.9–10.3)
Chloride: 106 mmol/L (ref 98–111)
Creatinine, Ser: 0.9 mg/dL (ref 0.61–1.24)
GFR calc Af Amer: >60 mL/min (ref >60)
GFR calc non Af Amer: >60 mL/min (ref >60)
Glucose, Bld: 102 mg/dL — ABNORMAL HIGH (ref 70–99)
Potassium: 3.9 mmol/L (ref 3.5–5.1)
Sodium: 137 mmol/L (ref 135–145)
Total Protein: 6.7 g/dL (ref 6.5–8.1)

## 2018-12-19 LAB — BLOOD GAS, ARTERIAL
Acid-Base Excess: 0.5 mmol/L (ref 0.0–2.0)
Bicarbonate: 24.4 mmol/L (ref 20.0–28.0)
Drawn by: 449841
FIO2: 21
O2 Saturation: 96.6 %
PH ART: 7.423 (ref 7.350–7.450)
Patient temperature: 98.6
pCO2 arterial: 38.1 mmHg (ref 32.0–48.0)
pO2, Arterial: 83.5 mmHg (ref 83.0–108.0)

## 2018-12-19 LAB — PULMONARY FUNCTION TEST
DL/VA % pred: 134 %
DL/VA: 5.77 ml/min/mmHg/L
DLCO unc % pred: 92 %
DLCO unc: 23.56 ml/min/mmHg
FEF 25-75 POST: 2.93 L/s
FEF 25-75 Pre: 2.77 L/sec
FEF2575-%Change-Post: 5 %
FEF2575-%Pred-Post: 116 %
FEF2575-%Pred-Pre: 109 %
FEV1-%CHANGE-POST: 5 %
FEV1-%Pred-Post: 78 %
FEV1-%Pred-Pre: 74 %
FEV1-Post: 2.34 L
FEV1-Pre: 2.22 L
FEV1FVC-%CHANGE-POST: 0 %
FEV1FVC-%Pred-Pre: 110 %
FEV6-%Change-Post: 6 %
FEV6-%Pred-Post: 74 %
FEV6-%Pred-Pre: 70 %
FEV6-Post: 2.81 L
FEV6-Pre: 2.64 L
FEV6FVC-%Change-Post: 0 %
FEV6FVC-%Pred-Post: 105 %
FEV6FVC-%Pred-Pre: 104 %
FVC-%CHANGE-POST: 5 %
FVC-%Pred-Post: 70 %
FVC-%Pred-Pre: 67 %
FVC-Post: 2.81 L
FVC-Pre: 2.66 L
Post FEV1/FVC ratio: 84 %
Post FEV6/FVC ratio: 100 %
Pre FEV1/FVC ratio: 83 %
Pre FEV6/FVC Ratio: 99 %
RV % pred: 106 %
RV: 2.1 L
TLC % pred: 81 %
TLC: 4.87 L

## 2018-12-19 LAB — URINALYSIS, ROUTINE W REFLEX MICROSCOPIC
Bilirubin Urine: NEGATIVE
Glucose, UA: NEGATIVE mg/dL
Hgb urine dipstick: NEGATIVE
Ketones, ur: NEGATIVE mg/dL
Leukocytes, UA: NEGATIVE
Nitrite: NEGATIVE
PROTEIN: NEGATIVE mg/dL
Specific Gravity, Urine: 1.017 (ref 1.005–1.030)
pH: 6 (ref 5.0–8.0)

## 2018-12-19 LAB — CBC
HEMATOCRIT: 45.6 % (ref 39.0–52.0)
Hemoglobin: 15.2 g/dL (ref 13.0–17.0)
MCH: 29.9 pg (ref 26.0–34.0)
MCHC: 33.3 g/dL (ref 30.0–36.0)
MCV: 89.6 fL (ref 80.0–100.0)
Platelets: 183 10*3/uL (ref 150–400)
RBC: 5.09 MIL/uL (ref 4.22–5.81)
RDW: 12.6 % (ref 11.5–15.5)
WBC: 8.1 10*3/uL (ref 4.0–10.5)
nRBC: 0 % (ref 0.0–0.2)

## 2018-12-19 LAB — PROTIME-INR
INR: 0.97
Prothrombin Time: 12.8 seconds (ref 11.4–15.2)

## 2018-12-19 LAB — SURGICAL PCR SCREEN
MRSA, PCR: NEGATIVE
Staphylococcus aureus: NEGATIVE

## 2018-12-19 LAB — HEMOGLOBIN A1C
Hgb A1c MFr Bld: 5.4 % (ref 4.8–5.6)
Mean Plasma Glucose: 108.28 mg/dL

## 2018-12-19 LAB — APTT: aPTT: 26 seconds (ref 24–36)

## 2018-12-19 LAB — ABO/RH: ABO/RH(D): A NEG

## 2018-12-19 MED ORDER — PHENYLEPHRINE HCL-NACL 20-0.9 MG/250ML-% IV SOLN
30.0000 ug/min | INTRAVENOUS | Status: AC
  Administered 2018-12-22: 10 ug/min via INTRAVENOUS
  Filled 2018-12-19: qty 250

## 2018-12-19 MED ORDER — NOREPINEPHRINE BITARTRATE 1 MG/ML IV SOLN
0.0000 ug/min | INTRAVENOUS | Status: DC
  Filled 2018-12-19: qty 4

## 2018-12-19 MED ORDER — INSULIN REGULAR(HUMAN) IN NACL 100-0.9 UT/100ML-% IV SOLN
INTRAVENOUS | Status: AC
  Administered 2018-12-22: 1 [IU]/h via INTRAVENOUS
  Filled 2018-12-19: qty 100

## 2018-12-19 MED ORDER — POTASSIUM CHLORIDE 2 MEQ/ML IV SOLN
80.0000 meq | INTRAVENOUS | Status: DC
  Filled 2018-12-19: qty 40

## 2018-12-19 MED ORDER — TRANEXAMIC ACID 1000 MG/10ML IV SOLN
1.5000 mg/kg/h | INTRAVENOUS | Status: AC
  Administered 2018-12-22: 1.5 mg/kg/h via INTRAVENOUS
  Filled 2018-12-19: qty 25

## 2018-12-19 MED ORDER — SODIUM CHLORIDE 0.9 % IV SOLN
INTRAVENOUS | Status: DC
  Filled 2018-12-19: qty 30

## 2018-12-19 MED ORDER — MAGNESIUM SULFATE 50 % IJ SOLN
40.0000 meq | INTRAMUSCULAR | Status: DC
  Filled 2018-12-19: qty 9.85

## 2018-12-19 MED ORDER — SODIUM CHLORIDE 0.9 % IV SOLN
1.5000 g | INTRAVENOUS | Status: AC
  Administered 2018-12-22: 1.5 g via INTRAVENOUS
  Filled 2018-12-19: qty 1.5

## 2018-12-19 MED ORDER — EPINEPHRINE PF 1 MG/ML IJ SOLN
0.0000 ug/min | INTRAVENOUS | Status: DC
  Filled 2018-12-19: qty 4

## 2018-12-19 MED ORDER — DOPAMINE-DEXTROSE 3.2-5 MG/ML-% IV SOLN
0.0000 ug/kg/min | INTRAVENOUS | Status: DC
  Filled 2018-12-19: qty 250

## 2018-12-19 MED ORDER — DEXMEDETOMIDINE HCL IN NACL 400 MCG/100ML IV SOLN
0.1000 ug/kg/h | INTRAVENOUS | Status: AC
  Administered 2018-12-22: .3 ug/kg/h via INTRAVENOUS
  Filled 2018-12-19: qty 100

## 2018-12-19 MED ORDER — TRANEXAMIC ACID (OHS) PUMP PRIME SOLUTION
2.0000 mg/kg | INTRAVENOUS | Status: DC
  Filled 2018-12-19: qty 1.62

## 2018-12-19 MED ORDER — MILRINONE LACTATE IN DEXTROSE 20-5 MG/100ML-% IV SOLN
0.3000 ug/kg/min | INTRAVENOUS | Status: DC
  Filled 2018-12-19: qty 100

## 2018-12-19 MED ORDER — PLASMA-LYTE 148 IV SOLN
INTRAVENOUS | Status: DC
  Filled 2018-12-19: qty 2.5

## 2018-12-19 MED ORDER — NITROGLYCERIN IN D5W 200-5 MCG/ML-% IV SOLN
2.0000 ug/min | INTRAVENOUS | Status: AC
  Administered 2018-12-22: 10 ug/min via INTRAVENOUS
  Filled 2018-12-19: qty 250

## 2018-12-19 MED ORDER — VANCOMYCIN HCL 10 G IV SOLR
1250.0000 mg | INTRAVENOUS | Status: AC
  Administered 2018-12-22: 1250 mg via INTRAVENOUS
  Filled 2018-12-19: qty 1250

## 2018-12-19 MED ORDER — ALBUTEROL SULFATE (2.5 MG/3ML) 0.083% IN NEBU
2.5000 mg | INHALATION_SOLUTION | Freq: Once | RESPIRATORY_TRACT | Status: AC
  Administered 2018-12-19: 2.5 mg via RESPIRATORY_TRACT

## 2018-12-19 MED ORDER — TRANEXAMIC ACID (OHS) BOLUS VIA INFUSION
15.0000 mg/kg | INTRAVENOUS | Status: AC
  Administered 2018-12-22: 1218 mg via INTRAVENOUS
  Filled 2018-12-19: qty 1218

## 2018-12-19 MED ORDER — SODIUM CHLORIDE 0.9 % IV SOLN
750.0000 mg | INTRAVENOUS | Status: DC
  Filled 2018-12-19: qty 750

## 2018-12-19 NOTE — Progress Notes (Signed)
Pre-CABG Dopplers have been completed. Refer to "CV Proc" under chart review to view preliminary results.  12/19/2018 11:02 AM Gertie FeyMichelle Brantlee Penn, MHA, RVT, RDCS, RDMS

## 2018-12-19 NOTE — Progress Notes (Signed)
PCP - Dr. Juanetta GoslingHawkins Cardiologist - Dr. Antoine PocheHochrein; Azalee CourseHao Meng, [place] Endocrinologist - Dr. Evlyn KannerSouth  Chest x-ray - 12/19/2018 EKG - 12/04/2018 Stress Test - patient denies ECHO - 10/06/2018 Cardiac Cath - 12/09/2018  Sleep Study - 2005/2006 CPAP - yes, patient instructed to bring DOS  Fasting Blood Sugar - n/a Checks Blood Sugar _____ times a day  Blood Thinner Instructions:n/a Aspirin Instructions:  Anesthesia review: n/a  Patient denies shortness of breath, fever, cough and chest pain at PAT appointment   Patient verbalized understanding of instructions that were given to them at the PAT appointment. Patient was also instructed that they will need to review over the PAT instructions again at home before surgery.

## 2018-12-21 NOTE — H&P (Signed)
[address]       [place] [address]             [phone]      Cardiothoracic Surgery Admission History and Physical   CORDERRO KOLOSKI is an 62 y.o. male.   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  HPI:   The patient is a 62 year old gentleman with a history of a heart murmur as a child, hyperthyroidism, kidney stones and OSA on CPAP who was seen by Dr. Diona Browner in 2017 for complaints of palpitations and shortness of breath associated with anxiety and feeling worn out. He had a monitor placed that showed sinus rhythm with an episode of SVT associated with aberrant conduction and retrograde P waves. He was started on Xarelto in case this was atrial fibrillation but after review of the monitor results with Dr. Johney Frame it was felt that this was not atrial fib or flutter and the Xarelto was stopped. He was started on Toprol XL.  He had an echo done as part of his workup which showed normal LV systolic function with an EF of 55-60% and grade 1 diastolic dysfunction. There was a possibly bicuspid aortic valve with moderate stenosis with a mean gradient of 14 mm Hg and a peak velocity ratio of 0.34 with a valve area of 1.05 cm2. The aorta was mildly dilated at 39 mm. CTA of the chest on 11/28/2016 showed a fusiform ascending aortic aneurysm with a diameter of 4.8 cm at the right PA and 4.2 cm at the sinus level.  He was first seen by me on 12/19/2016 and I felt that he had a bicuspid aortic valve with moderate aortic stenosis and a 4.8 cm fusiform ascending aortic aneurysm.  This was below the surgical threshold and I recommended continuing to follow him.  He developed progressive symptoms of exertional fatigue, tiredness, shortness of breath this sounded like they were related to aortic stenosis although his mean gradient was still only in the moderate range at most.  We decided to continue  following him and I saw him again on 11/19/2018.  He was now complaining of 6 episodes of dizziness and blurred vision since I seen him previously in March and he had 2 episodes of this in the week that I saw him.  He has felt generally weak with some exertional shortness of breath particularly with lifting.  He continue to work 10-hour days.  He was also having some lower extremity edema.  A repeat echocardiogram showed a mean gradient of 19 mmHg across the aortic valve although it looked severely calcified and stenotic.  The aortic valve area was measured at 0.85 cm.  CTA of the chest showed the aortic root and ascending aorta to be 4.9 cm.  Since he had developed New York Heart Association class III symptoms of congestive heart failure I felt that it was time to proceed with surgical treatment.  He underwent cardiac catheterization which showed no coronary disease.  Past Medical History:  Diagnosis Date  . Aortic stenosis, severe   . Arthritis   . Ascending aortic aneurysm (HCC)   . GERD (gastroesophageal reflux disease)   . Headache   . History of heart murmur in childhood   . History of kidney stones   . Hyperthyroidism   . Impaired glucose tolerance   . Nephrolithiasis   . Sleep apnea  On CPAP    Past Surgical History:  Procedure Laterality Date  . CARDIAC CATHETERIZATION  1990s  . CYSTOSCOPY  1990s   With stone extraction  . CYSTOSCOPY W/ URETERAL STENT PLACEMENT Left 12/02/2015   Procedure: CYSTOSCOPY WITH RETROGRADE PYELOGRAM/URETERAL STENT PLACEMENT;  Surgeon: Malen Gauze, MD;  Location: [facility];  Service: Urology;  Laterality: Left;  . ESOPHAGOGASTRODUODENOSCOPY  1990s  . KNEE ARTHROSCOPY Left 2005  . LITHOTRIPSY  1990s  . LITHOTRIPSY Left 11/2015  . RIGHT/LEFT HEART CATH AND CORONARY ANGIOGRAPHY N/A 12/09/2018   Procedure: RIGHT/LEFT HEART CATH AND CORONARY ANGIOGRAPHY;  Surgeon: Corky Crafts, MD;  Location: [facility] INVASIVE CV LAB;  Service: Cardiovascular;   Laterality: N/A;  . ULTRASOUND GUIDANCE FOR VASCULAR ACCESS  12/09/2018   Procedure: Ultrasound Guidance For Vascular Access;  Surgeon: Corky Crafts, MD;  Location: [facility] INVASIVE CV LAB;  Service: Cardiovascular;;  . UMBILICAL HERNIA REPAIR  late 1990s    Family History  Problem Relation Age of Onset  . Depression Mother   . Hyperlipidemia Mother   . Cancer Father   . Kidney disease Father   . Breast cancer Maternal Grandmother   . Heart disease Maternal Grandfather    Social History:  reports that he is a non-smoker but has been exposed to tobacco smoke. He has been exposed to tobacco smoke for the past 17.00 years. He has never used smokeless tobacco. He reports current alcohol use. He reports that he does not use drugs.  Allergies:  Allergies  Allergen Reactions  . Demerol [Meperidine] Anxiety and Other (See Comments)    Makes pt hyper, the higher the dose the more hyper pt gets  . Stadol [Butorphanol] Anxiety and Other (See Comments)    Hyper- the higher the dose the more hyper pt gets    No medications prior to admission.    Results for orders placed or performed during the hospital encounter of 12/19/18 (from the past 48 hour(s))  Surgical pcr screen     Status: None   Collection Time: 12/19/18 12:01 PM  Result Value Ref Range   MRSA, PCR NEGATIVE NEGATIVE   Staphylococcus aureus NEGATIVE NEGATIVE    Comment: (NOTE) The Xpert SA Assay (FDA approved for NASAL specimens in patients 28 years of age and older), is one component of a comprehensive surveillance program. It is not intended to diagnose infection nor to guide or monitor treatment. Performed at [facility], [address] [place], [place] [address]   APTT     Status: None   Collection Time: 12/19/18 12:02 PM  Result Value Ref Range   aPTT 26 24 - 36 seconds    Comment: Performed at [facility], [address] [place], [place] [address]  Blood gas, arterial on room air     Status: None    Collection Time: 12/19/18 12:02 PM  Result Value Ref Range   FIO2 21.00    Delivery systems ROOM AIR    pH, Arterial 7.423 7.350 - 7.450   pCO2 arterial 38.1 32.0 - 48.0 mmHg   pO2, Arterial 83.5 83.0 - 108.0 mmHg   Bicarbonate 24.4 20.0 - 28.0 mmol/L   Acid-Base Excess 0.5 0.0 - 2.0 mmol/L   O2 Saturation 96.6 %   Patient temperature 98.6    Collection site LEFT RADIAL    Drawn by [id number]    Sample type ARTERIAL DRAW    Allens test (pass/fail) PASS PASS  CBC     Status:  None   Collection Time: 12/19/18 12:02 PM  Result Value Ref Range   WBC 8.1 4.0 - 10.5 K/uL   RBC 5.09 4.22 - 5.81 MIL/uL   Hemoglobin 15.2 13.0 - 17.0 g/dL   HCT 13.2 44.0 - 10.2 %   MCV 89.6 80.0 - 100.0 fL   MCH 29.9 26.0 - 34.0 pg   MCHC 33.3 30.0 - 36.0 g/dL   RDW 72.5 36.6 - 44.0 %   Platelets 183 150 - 400 K/uL   nRBC 0.0 0.0 - 0.2 %    Comment: Performed at [facility], [address] [place], [place] [address]  Comprehensive metabolic panel     Status: Abnormal   Collection Time: 12/19/18 12:02 PM  Result Value Ref Range   Sodium 137 135 - 145 mmol/L   Potassium 3.9 3.5 - 5.1 mmol/L   Chloride 106 98 - 111 mmol/L   CO2 22 22 - 32 mmol/L   Glucose, Bld 102 (H) 70 - 99 mg/dL   BUN 9 6 - 20 mg/dL   Creatinine, Ser 5.95 0.61 - 1.24 mg/dL   Calcium 8.8 (L) 8.9 - 10.3 mg/dL   Total Protein 6.7 6.5 - 8.1 g/dL   Albumin 4.2 3.5 - 5.0 g/dL   AST 20 15 - 41 U/L   ALT 22 0 - 44 U/L   Alkaline Phosphatase 38 38 - 126 U/L   Total Bilirubin 0.9 0.3 - 1.2 mg/dL   GFR calc non Af Amer >60 >60 mL/min   GFR calc Af Amer >60 >60 mL/min   Anion gap 9 5 - 15    Comment: Performed at [facility] Lab, [address] [place], [place] [address]  Hemoglobin A1c     Status: None   Collection Time: 12/19/18 12:02 PM  Result Value Ref Range   Hgb A1c MFr Bld 5.4 4.8 - 5.6 %    Comment: (NOTE) Pre diabetes:          5.7%-6.4% Diabetes:              >6.4% Glycemic control for   <7.0% adults with  diabetes    Mean Plasma Glucose 108.28 mg/dL    Comment: Performed at [facility], [address], [place], [place] [address]  Protime-INR     Status: None   Collection Time: 12/19/18 12:02 PM  Result Value Ref Range   Prothrombin Time 12.8 11.4 - 15.2 seconds   INR 0.97     Comment: Performed at [facility], [address], [place], [place] [address]  Urinalysis, Routine w reflex microscopic     Status: None   Collection Time: 12/19/18 12:02 PM  Result Value Ref Range   Color, Urine YELLOW YELLOW   APPearance CLEAR CLEAR   Specific Gravity, Urine 1.017 1.005 - 1.030   pH 6.0 5.0 - 8.0   Glucose, UA NEGATIVE NEGATIVE mg/dL   Hgb urine dipstick NEGATIVE NEGATIVE   Bilirubin Urine NEGATIVE NEGATIVE   Ketones, ur NEGATIVE NEGATIVE mg/dL   Protein, ur NEGATIVE NEGATIVE mg/dL   Nitrite NEGATIVE NEGATIVE   Leukocytes, UA NEGATIVE NEGATIVE    Comment: Performed at [facility] Lab, [address] [place], [place] [address]  Type and screen     Status: None   Collection Time: 12/19/18 12:18 PM  Result Value Ref Range   ABO/RH(D) A NEG    Antibody Screen NEG    Sample Expiration 01/02/2019  Extend sample reason NO TRANSFUSIONS OR PREGNANCY IN THE PAST 3 MONTHS   ABO/Rh     Status: None   Collection Time: 12/19/18 12:18 PM  Result Value Ref Range   ABO/RH(D)      A NEG Performed at [facility], [address] [place], [place] [address]    Dg Chest 2 View  Result Date: 12/19/2018 CLINICAL DATA:  Pre-op for open heart surgery - hx of GERD, sleep apnea, aortic stenosis, AAA, nonsmoker EXAM: CHEST - 2 VIEW COMPARISON:  03/15/2006 FINDINGS: The heart size and mediastinal contours are within normal limits. Both lungs are clear. The visualized skeletal structures are unremarkable. IMPRESSION: No active cardiopulmonary disease. Electronically Signed   By: Norva Pavlov M.D.   On: 12/19/2018 13:05    Review of Systems  Constitutional: Positive for  malaise/fatigue.  HENT: Negative.   Eyes: Negative.   Respiratory: Positive for shortness of breath.   Cardiovascular: Positive for leg swelling. Negative for chest pain, orthopnea and PND.  Gastrointestinal: Negative.   Genitourinary: Negative.   Musculoskeletal: Negative.   Skin: Negative.   Neurological: Positive for dizziness. Negative for focal weakness and loss of consciousness.  Endo/Heme/Allergies: Negative.   Psychiatric/Behavioral: Negative.     There were no vitals taken for this visit. Physical Exam  Constitutional: He is oriented to person, place, and time. He appears well-developed and well-nourished. No distress.  HENT:  Head: Normocephalic.  Mouth/Throat: Oropharynx is clear and moist.  Eyes: Pupils are equal, round, and reactive to light. EOM are normal.  Neck: Normal range of motion. Neck supple. No JVD present.  Cardiovascular: Normal rate, regular rhythm and intact distal pulses.  Murmur heard. 2/6 systolic murmur along the right sternal border.  Respiratory: Effort normal and breath sounds normal. No respiratory distress.  GI: Soft. Bowel sounds are normal. He exhibits no distension. There is no abdominal tenderness.  Musculoskeletal: Normal range of motion.        General: Edema present.  Lymphadenopathy:    He has no cervical adenopathy.  Neurological: He is alert and oriented to person, place, and time. He has normal strength. No cranial nerve deficit or sensory deficit.  Skin: Skin is warm and dry.  Psychiatric: He has a normal mood and affect.    Result status: Final result                      *[facility]*                         [address]                        [place], [place] [address]                            [phone]  ------------------------------------------------------------------- Transthoracic Echocardiography  Patient:    Arham, Symmonds MR #:       [id number] Study Date: 10/06/2018 Gender:     M Age:         62 Height:     165.1 cm Weight:     79.8 kg BSA:        1.94 m^2 Pt. Status: Room:   ATTENDING    Kari Baars [address] [id number]  SONOGRAPHER  Johanna Elliott  PERFORMING   [facility], Pattricia Boss [facility]  cc:  ------------------------------------------------------------------- LV EF: 60% -   65%  ------------------------------------------------------------------- Indications:      Aortic stenosis 424.1.  ------------------------------------------------------------------- History:   PMH:  4.8 cm fusiform ascending aortic aneurysm  Murmur.   ------------------------------------------------------------------- Study Conclusions  - Left ventricle: The cavity size was normal. Wall thickness was   increased in a pattern of mild LVH. Systolic function was normal.   The estimated ejection fraction was in the range of 60% to 65%.   Wall motion was normal; there were no regional wall motion   abnormalities. Doppler parameters are consistent with abnormal   left ventricular relaxation (grade 1 diastolic dysfunction).   Doppler parameters are consistent with high ventricular filling   pressure. - Aortic valve: Severely calcified leaflets. There was trivial   regurgitation. Morphologically, the valve stenosis severity   appears worse than measured parameters. There is at least   moderate calcific aortic stenosis. Valve area (VTI): 1 cm^2.   Valve area (Vmax): 0.85 cm^2. Valve area (Vmean): 0.91 cm^2. - Aorta: Moderately dilated ascending thoracic aorta (4.57 cm in   maximal diameter). - Right ventricle: Systolic function was reduced.  Impressions:  - Consider TEE if deemed clinically indicated.  ------------------------------------------------------------------- Study data:  Comparison was made to the study of 12/27/2017.  Study status:  Routine.  Procedure:  Transthoracic echocardiography. Image quality was  adequate.          Transthoracic echocardiography.  M-mode, complete 2D, spectral Doppler, and color Doppler.  Birthdate:  Patient birthdate: 1956-06-17.  Age:  Patient is 62 yr old.  Sex:  Gender: male.    BMI: 29.3 kg/m^2.  Blood pressure:     151/81  Patient status:  Outpatient.  Study date: Study date: 10/06/2018. Study time: 03:07 PM.  Location:  Echo laboratory.  -------------------------------------------------------------------  ------------------------------------------------------------------- Left ventricle:  The cavity size was normal. Wall thickness was increased in a pattern of mild LVH. Systolic function was normal. The estimated ejection fraction was in the range of 60% to 65%. Wall motion was normal; there were no regional wall motion abnormalities. Doppler parameters are consistent with abnormal left ventricular relaxation (grade 1 diastolic dysfunction). Doppler parameters are consistent with high ventricular filling pressure.   ------------------------------------------------------------------- Aortic valve:   Severely calcified leaflets. Uncertain leaflet number.  Doppler:  There was trivial regurgitation. Morphologically,  the valve stenosis severity appears worse than measured parameters. There is at least moderate calcific aortic stenosis. VTI ratio of LVOT to aortic valve: 0.32. Valve area (VTI): 1 cm^2. Indexed valve area (VTI): 0.52 cm^2/m^2. Peak velocity ratio of LVOT to aortic valve: 0.27. Valve area (Vmax): 0.85 cm^2. Indexed valve area (Vmax): 0.44 cm^2/m^2. Mean velocity ratio of LVOT to aortic valve: 0.29. Valve area (Vmean): 0.91 cm^2. Indexed valve area (Vmean): 0.47 cm^2/m^2.    Mean gradient (S): 19 mm Hg. Peak gradient (S): 32 mm Hg.  ------------------------------------------------------------------- Aorta:  Moderately dilated ascending thoracic aorta (4.57 cm in maximal diameter). Aortic root: The aortic root was normal in  size.  ------------------------------------------------------------------- Mitral valve:   Normal thickness leaflets .  Doppler:  There was no significant regurgitation.    Peak gradient (D): 3 mm Hg.  ------------------------------------------------------------------- Left atrium:  The atrium was normal in size.  ------------------------------------------------------------------- Right ventricle:  The cavity size was normal. Wall thickness was normal. Systolic function was reduced.  ------------------------------------------------------------------- Pulmonic valve:    The valve appears to be grossly normal. Doppler:  There was trivial regurgitation.  -------------------------------------------------------------------  Tricuspid valve:   The valve appears to be grossly normal. Doppler:  There was no significant regurgitation.  ------------------------------------------------------------------- Right atrium:  The atrium was normal in size.  ------------------------------------------------------------------- Pericardium:  There was no pericardial effusion.   ------------------------------------------------------------------- Post procedure conclusions Ascending Aorta:  - Moderately dilated ascending thoracic aorta (4.57 cm in maximal   diameter).  ------------------------------------------------------------------- Measurements   Left ventricle                           Value          Reference  LV ID, ED, PLAX chordal                  47.7  mm       43 - 52  LV ID, ES, PLAX chordal                  26.8  mm       23 - 38  LV fx shortening, PLAX chordal           44    %        >=29  LV PW thickness, ED                      12.7  mm       ----------  IVS/LV PW ratio, ED                      0.83           <=1.3  Stroke volume, 2D                        59    ml       ----------  Stroke volume/bsa, 2D                    30    ml/m^2   ----------  LV e&', lateral                            10.8  cm/s     ----------  LV E/e&', lateral                         7.5            ----------  LV e&', medial                            5     cm/s     ----------  LV E/e&', medial                          16.2           ----------  LV e&', average                           7.9   cm/s     ----------  LV E/e&', average                         10.25          ----------    Ventricular septum  Value          Reference  IVS thickness, ED                        10.6  mm       ----------    LVOT                                     Value          Reference  LVOT ID, S                               20    mm       ----------  LVOT area                                3.14  cm^2     ----------  LVOT peak velocity, S                    77.6  cm/s     ----------  LVOT mean velocity, S                    58.1  cm/s     ----------  LVOT VTI, S                              18.7  cm       ----------    Aortic valve                             Value          Reference  Aortic valve peak velocity, S            285   cm/s     ----------  Aortic valve mean velocity, S            201   cm/s     ----------  Aortic valve VTI, S                      58.5  cm       ----------  Aortic mean gradient, S                  19    mm Hg    ----------  Aortic peak gradient, S                  32    mm Hg    ----------  VTI ratio, LVOT/AV                       0.32           ----------  Aortic valve area, VTI                   1     cm^2     ----------  Aortic valve area/bsa, VTI               0.52  cm^2/m^2 ----------  Velocity ratio, peak, LVOT/AV  0.27           ----------  Aortic valve area, peak velocity         0.85  cm^2     ----------  Aortic valve area/bsa, peak              0.44  cm^2/m^2 ----------  velocity  Velocity ratio, mean, LVOT/AV            0.29           ----------  Aortic valve area, mean velocity         0.91  cm^2     ----------  Aortic valve  area/bsa, mean              0.47  cm^2/m^2 ----------  velocity  Aortic regurg pressure half-time         653   ms       ----------    Aorta                                    Value          Reference  Aortic root ID, ED                       37    mm       ----------    Left atrium                              Value          Reference  LA ID, A-P, ES                           39    mm       ----------  LA ID/bsa, A-P                           2.01  cm/m^2   <=2.2  LA volume, S                             50.1  ml       ----------  LA volume/bsa, S                         25.9  ml/m^2   ----------  LA volume, ES, 1-p A4C                   45.2  ml       ----------  LA volume/bsa, ES, 1-p A4C               23.3  ml/m^2   ----------  LA volume, ES, 1-p A2C                   52.1  ml       ----------  LA volume/bsa, ES, 1-p A2C               26.9  ml/m^2   ----------    Mitral valve                             Value  Reference  Mitral E-wave peak velocity              81    cm/s     ----------  Mitral A-wave peak velocity              91.7  cm/s     ----------  Mitral deceleration time         (H)     243   ms       150 - 230  Mitral peak gradient, D                  3     mm Hg    ----------  Mitral E/A ratio, peak                   0.9            ----------    Right atrium                             Value          Reference  RA ID, S-I, ES, A4C                      48.7  mm       34 - 49  RA area, ES, A4C                         15.7  cm^2     8.3 - 19.5  RA volume, ES, A/L                       40.7  ml       ----------  RA volume/bsa, ES, A/L                   21    ml/m^2   ----------    Systemic veins                           Value          Reference  Estimated CVP                            3     mm Hg    ----------    Right ventricle                          Value          Reference  TAPSE                                    14.7  mm       ----------  RV s&', lateral,  S                        7.62  cm/s     ----------  Legend: (L)  and  (H)  mark values outside specified reference range.  ------------------------------------------------------------------- Prepared and Electronically Authenticated by  Prentice Docker, MD 2019-10-14T16:30:29    Physicians   Panel Physicians Referring Physician Case Authorizing Physician  Corky Crafts, MD (Primary)  Procedures   RIGHT/LEFT HEART CATH AND CORONARY ANGIOGRAPHY  Conclusion     Ao sat 99%, PA sat 77%, mean PA pressure 13 mm Hg; mean PCWP 6 mm Hg; CO 5.4 L/min; CI 2.87  No angiographically apparent CAD.  Normal pulmonary artery pressures.  Tortuous subclavian with apparent lusoria anomaly. Would not use right radial for heart cath in the future.   Plan is for AVR on 12/12/18.    Indications   Nonrheumatic aortic valve stenosis [I35.0 (ICD-10-CM)]  Procedural Details   Technical Details The risks, benefits, and details of the procedure were explained to the patient. The patient verbalized understanding and wanted to proceed. Informed written consent was obtained.  PROCEDURE TECHNIQUE: After Xylocaine anesthesia, a 5 French sheath was placed in a right antecubital vein using ultrasound guidance. A 5 French balloontipped Swan-Ganz catheter was advanced to the pulmonary artery under fluoroscopic guidance. Hemodynamic pressures were obtained. Oxygen saturations were obtained. After Xylocaine anesthesia, a 55F sheath was placed in the right radial artery with a single anterior needle wall stick using ultrasound guidance. An image was captured and stored. Left coronary angiography was done using a Judkins L4 guide catheter. Right coronary angiography was done using a Judkins R4 guide catheter. Left heart cath was not done due to known aortic stenosis with plan for surgery.     Contrast: 55 cc  Estimated blood loss <50 mL.   During this procedure medications were administered to  achieve and maintain moderate conscious sedation while the patient's heart rate, blood pressure, and oxygen saturation were continuously monitored and I was present face-to-face 100% of this time.  Medications  (Filter: Administrations occurring from 12/09/18 1222 to 12/09/18 1329)  Medication Rate/Dose/Volume Action  Date Time   Heparin (Porcine) in NaCl 1000-0.9 UT/500ML-% SOLN (mL) 500 mL Given 12/09/18 1228   Total dose as of 12/21/18 1141 500 mL Given 1228   1,000 mL        fentaNYL (SUBLIMAZE) injection (mcg) 25 mcg Given 12/09/18 1248   Total dose as of 12/21/18 1141 25 mcg Given 1259   50 mcg        midazolam (VERSED) injection (mg) 2 mg Given 12/09/18 1249   Total dose as of 12/21/18 1141 1 mg Given 1259   3 mg        lidocaine (PF) (XYLOCAINE) 1 % injection (mL) 2 mL Given 12/09/18 1249   Total dose as of 12/21/18 1141 2 mL Given 1254   4 mL        Radial Cocktail/Verapamil only (mL) 10 mL Given 12/09/18 1252   Total dose as of 12/21/18 1141        10 mL        heparin injection (Units) 4,000 Units Given 12/09/18 1311   Total dose as of 12/21/18 1141        4,000 Units        iohexol (OMNIPAQUE) 350 MG/ML injection (mL) 55 mL Given 12/09/18 1329   Total dose as of 12/21/18 1141        55 mL        Sedation Time   Sedation Time Physician-1: 30 minutes 15 seconds  Complications   Complications documented before study signed (12/09/2018 1:33 PM EST)    No complications were associated with this study.  Documented by Corky Crafts, MD - 12/09/2018 1:26 PM EST    Coronary Findings   Diagnostic  Dominance: Left  No diagnostic findings have been documented.  Intervention  No interventions have been documented.  Right Heart   Right Heart Pressures Ao sat 99%, PA sat 77%, mean PA pressure 13 mm Hg; mean PCWP 6 mm Hg; CO 5.4 L/min; CI 2.87  Left Heart   Aortic Valve The aortic valve is calcified.  Coronary Diagrams   Diagnostic  Dominance: Left     Intervention   Implants    No implant documentation for this case.  MERGE Images   Show images for CARDIAC CATHETERIZATION   Link to Procedure Log   Procedure Log    Hemo Data    Most Recent Value  Fick Cardiac Output 5.41 L/min  Fick Cardiac Output Index 2.87 (L/min)/BSA  RA A Wave 6 mmHg  RA V Wave 7 mmHg  RA Mean 4 mmHg  RV Systolic Pressure 26 mmHg  RV Diastolic Pressure 3 mmHg  RV EDP 7 mmHg  PA Systolic Pressure 22 mmHg  PA Diastolic Pressure 6 mmHg  PA Mean 13 mmHg  PW A Wave 11 mmHg  PW V Wave 8 mmHg  PW Mean 6 mmHg  AO Systolic Pressure 118 mmHg  AO Diastolic Pressure 73 mmHg  AO Mean 92 mmHg  QP/QS 1  TPVR Index 4.53 HRUI  TSVR Index 32.06 HRUI  PVR SVR Ratio 0.08  TPVR/TSVR Ratio 0.14    Assessment/Plan  This 62 year old gentleman has a history of a bicuspid aortic valve that has become progressively more calcified.  The mean gradient across his valve is 19 mmHg although his valve looks severely stenotic.  The aortic valve area is measured at 0.85 cm by V-max.  He also has a 4.9 cm fusiform ascending aortic aneurysm.  The diameter of the proximal arch at the takeoff of the innominate artery is 4.0 cm.  The aorta decreases to 3 cm in diameter between the innominate and left carotid arteries.  I am concerned about the appearance of his aortic valve as well as the development of multiple episodes of dizziness and blurred vision with mild exertion.  He has New York Heart Association class III symptoms of exertional fatigue and shortness of breath consistent with chronic diastolic congestive heart failure and his echocardiogram shows grade 1 diastolic dysfunction with Doppler parameters suggesting high ventricular filling pressure.  I think it would be best to proceed with aortic valve replacement and replacement of his ascending aortic aneurysm.  I reviewed the echocardiogram and CT images with him and his wife and answered all their questions.  Cardiac  catheterization shows no significant coronary disease. I discussed the alternatives of mechanical and bioprosthetic aortic valves.  He and his wife would like to use a bioprosthetic valve to avoid the need for Coumadin and I think that is a reasonable option at his age.  We will plan to proceed with Bentall procedure using a bioprosthetic valved graft and replacement of the ascending aortic aneurysm using deep hypothermic circulatory arrest. I discussed the operative procedure with the patient and his wife including alternatives, benefits and risks; including but not limited to bleeding, blood transfusion, infection, stroke, myocardial infarction, graft failure, heart block requiring a permanent pacemaker, organ dysfunction, and death.  Delila Spenceharles R Heaps understands and agrees to proceed.    Alleen BorneBryan K Bartle, MD 12/21/2018, 11:30 AM

## 2018-12-22 ENCOUNTER — Inpatient Hospital Stay (HOSPITAL_COMMUNITY)
Admission: RE | Disposition: A | Discharge status: Home / Self Care | Payer: Self-pay | Source: Home / Self Care | Attending: Surgery

## 2018-12-22 ENCOUNTER — Inpatient Hospital Stay (HOSPITAL_COMMUNITY): Payer: Commercial Managed Care - PPO | Admitting: Vascular Surgery

## 2018-12-22 ENCOUNTER — Inpatient Hospital Stay (HOSPITAL_COMMUNITY): Payer: Commercial Managed Care - PPO

## 2018-12-22 ENCOUNTER — Inpatient Hospital Stay (HOSPITAL_COMMUNITY)
Admission: RE | Admit: 2018-12-22 | Discharge: 2018-12-27 | DRG: 220 | Disposition: A | Discharge status: Home / Self Care | Payer: Commercial Managed Care - PPO | Attending: Surgery | Admitting: Surgery

## 2018-12-22 ENCOUNTER — Encounter (HOSPITAL_COMMUNITY): Payer: Self-pay | Admitting: *Deleted

## 2018-12-22 DIAGNOSIS — Z7722 Contact with and (suspected) exposure to environmental tobacco smoke (acute) (chronic): Secondary | ICD-10-CM | POA: Diagnosis present

## 2018-12-22 DIAGNOSIS — Q231 Congenital insufficiency of aortic valve: Secondary | ICD-10-CM | POA: Diagnosis not present

## 2018-12-22 DIAGNOSIS — Z803 Family history of malignant neoplasm of breast: Secondary | ICD-10-CM

## 2018-12-22 DIAGNOSIS — I350 Nonrheumatic aortic (valve) stenosis: Secondary | ICD-10-CM | POA: Diagnosis not present

## 2018-12-22 DIAGNOSIS — Z885 Allergy status to narcotic agent status: Secondary | ICD-10-CM

## 2018-12-22 DIAGNOSIS — Z818 Family history of other mental and behavioral disorders: Secondary | ICD-10-CM

## 2018-12-22 DIAGNOSIS — Z8349 Family history of other endocrine, nutritional and metabolic diseases: Secondary | ICD-10-CM

## 2018-12-22 DIAGNOSIS — Z9889 Other specified postprocedural states: Secondary | ICD-10-CM

## 2018-12-22 DIAGNOSIS — I7121 Aneurysm of the ascending aorta, without rupture: Secondary | ICD-10-CM

## 2018-12-22 DIAGNOSIS — G4733 Obstructive sleep apnea (adult) (pediatric): Secondary | ICD-10-CM | POA: Diagnosis present

## 2018-12-22 DIAGNOSIS — Z8679 Personal history of other diseases of the circulatory system: Secondary | ICD-10-CM

## 2018-12-22 DIAGNOSIS — Z8249 Family history of ischemic heart disease and other diseases of the circulatory system: Secondary | ICD-10-CM

## 2018-12-22 DIAGNOSIS — K219 Gastro-esophageal reflux disease without esophagitis: Secondary | ICD-10-CM | POA: Diagnosis present

## 2018-12-22 DIAGNOSIS — Z952 Presence of prosthetic heart valve: Secondary | ICD-10-CM

## 2018-12-22 DIAGNOSIS — Z87442 Personal history of urinary calculi: Secondary | ICD-10-CM

## 2018-12-22 DIAGNOSIS — I5032 Chronic diastolic (congestive) heart failure: Secondary | ICD-10-CM | POA: Diagnosis present

## 2018-12-22 DIAGNOSIS — D62 Acute posthemorrhagic anemia: Secondary | ICD-10-CM | POA: Diagnosis not present

## 2018-12-22 DIAGNOSIS — Z9989 Dependence on other enabling machines and devices: Secondary | ICD-10-CM | POA: Diagnosis not present

## 2018-12-22 DIAGNOSIS — I712 Thoracic aortic aneurysm, without rupture: Principal | ICD-10-CM | POA: Diagnosis present

## 2018-12-22 DIAGNOSIS — Z841 Family history of disorders of kidney and ureter: Secondary | ICD-10-CM

## 2018-12-22 HISTORY — PX: REPLACEMENT ASCENDING AORTA: SHX6068

## 2018-12-22 HISTORY — PX: BENTALL PROCEDURE: SHX5058

## 2018-12-22 HISTORY — PX: TEE WITHOUT CARDIOVERSION: SHX5443

## 2018-12-22 LAB — POCT I-STAT 3, ART BLOOD GAS (G3+)
Acid-Base Excess: 1 mmol/L (ref 0.0–2.0)
Acid-base deficit: 1 mmol/L (ref 0.0–2.0)
Acid-base deficit: 1 mmol/L (ref 0.0–2.0)
BICARBONATE: 26.4 mmol/L (ref 20.0–28.0)
BICARBONATE: 24.9 mmol/L (ref 20.0–28.0)
Bicarbonate: 25.1 mmol/L (ref 20.0–28.0)
O2 Saturation: 98 %
O2 Saturation: 97 %
O2 Saturation: 97 %
PO2 ART: 109 mmHg — AB (ref 83.0–108.0)
Patient temperature: 36
Patient temperature: 37.5
Patient temperature: 37.9
TCO2: 27 mmol/L (ref 22–32)
TCO2: 28 mmol/L (ref 22–32)
TCO2: 26 mmol/L (ref 22–32)
pCO2 arterial: 43.7 mmHg (ref 32.0–48.0)
pCO2 arterial: 45.2 mmHg (ref 32.0–48.0)
pCO2 arterial: 47.2 mmHg (ref 32.0–48.0)
pH, Arterial: 7.363 (ref 7.350–7.450)
pH, Arterial: 7.376 (ref 7.350–7.450)
pH, Arterial: 7.333 — ABNORMAL LOW (ref 7.350–7.450)
pO2, Arterial: 101 mmHg (ref 83.0–108.0)
pO2, Arterial: 98 mmHg (ref 83.0–108.0)

## 2018-12-22 LAB — APTT: aPTT: 35 seconds (ref 24–36)

## 2018-12-22 LAB — CBC
HCT: 32.1 % — ABNORMAL LOW (ref 39.0–52.0)
HEMATOCRIT: 34.6 % — AB (ref 39.0–52.0)
Hemoglobin: 11 g/dL — ABNORMAL LOW (ref 13.0–17.0)
Hemoglobin: 11.9 g/dL — ABNORMAL LOW (ref 13.0–17.0)
MCH: 30.1 pg (ref 26.0–34.0)
MCH: 30.7 pg (ref 26.0–34.0)
MCHC: 34.3 g/dL (ref 30.0–36.0)
MCHC: 34.4 g/dL (ref 30.0–36.0)
MCV: 87.7 fL (ref 80.0–100.0)
MCV: 89.2 fL (ref 80.0–100.0)
NRBC: 0 % (ref 0.0–0.2)
Platelets: UNDETERMINED 10*3/uL (ref 150–400)
Platelets: 124 10*3/uL — ABNORMAL LOW (ref 150–400)
RBC: 3.66 MIL/uL — ABNORMAL LOW (ref 4.22–5.81)
RBC: 3.88 MIL/uL — ABNORMAL LOW (ref 4.22–5.81)
RDW: 12.6 % (ref 11.5–15.5)
RDW: 13 % (ref 11.5–15.5)
WBC: 14.4 10*3/uL — ABNORMAL HIGH (ref 4.0–10.5)
WBC: 14.6 10*3/uL — ABNORMAL HIGH (ref 4.0–10.5)
nRBC: 0 % (ref 0.0–0.2)

## 2018-12-22 LAB — PLATELET COUNT: Platelets: 128 10*3/uL — ABNORMAL LOW (ref 150–400)

## 2018-12-22 LAB — POCT I-STAT, CHEM 8
BUN: 14 mg/dL (ref 6–20)
Calcium, Ion: 1.1 mmol/L — ABNORMAL LOW (ref 1.15–1.40)
Chloride: 106 mmol/L (ref 98–111)
Creatinine, Ser: 0.9 mg/dL (ref 0.61–1.24)
Glucose, Bld: 113 mg/dL — ABNORMAL HIGH (ref 70–99)
HCT: 32 % — ABNORMAL LOW (ref 39.0–52.0)
Hemoglobin: 10.9 g/dL — ABNORMAL LOW (ref 13.0–17.0)
Potassium: 5.4 mmol/L — ABNORMAL HIGH (ref 3.5–5.1)
Sodium: 138 mmol/L (ref 135–145)
TCO2: 24 mmol/L (ref 22–32)

## 2018-12-22 LAB — ECHO TEE
AV Area mean vel: 166 cm2
AV Mean grad: 13 mmHg
AV Peak grad: 26 mmHg
AV pk vel: 255 cm/s
Ao-asc: 4.6 cm

## 2018-12-22 LAB — GLUCOSE, CAPILLARY
GLUCOSE-CAPILLARY: 142 mg/dL — AB (ref 70–99)
GLUCOSE-CAPILLARY: 131 mg/dL — AB (ref 70–99)
Glucose-Capillary: 126 mg/dL — ABNORMAL HIGH (ref 70–99)
Glucose-Capillary: 107 mg/dL — ABNORMAL HIGH (ref 70–99)
Glucose-Capillary: 105 mg/dL — ABNORMAL HIGH (ref 70–99)
Glucose-Capillary: 99 mg/dL (ref 70–99)
Glucose-Capillary: 125 mg/dL — ABNORMAL HIGH (ref 70–99)

## 2018-12-22 LAB — PREPARE RBC (CROSSMATCH)

## 2018-12-22 LAB — POCT I-STAT 4, (NA,K, GLUC, HGB,HCT)
Glucose, Bld: 91 mg/dL (ref 70–99)
HCT: 29 % — ABNORMAL LOW (ref 39.0–52.0)
Hemoglobin: 9.9 g/dL — ABNORMAL LOW (ref 13.0–17.0)
Potassium: 3.4 mmol/L — ABNORMAL LOW (ref 3.5–5.1)
Sodium: 142 mmol/L (ref 135–145)

## 2018-12-22 LAB — CREATININE, SERUM
CREATININE: 0.91 mg/dL (ref 0.61–1.24)
GFR calc Af Amer: >60 mL/min (ref >60)
GFR calc non Af Amer: >60 mL/min (ref >60)

## 2018-12-22 LAB — PROTIME-INR
INR: 1.41
Prothrombin Time: 17.1 seconds — ABNORMAL HIGH (ref 11.4–15.2)

## 2018-12-22 LAB — MAGNESIUM: Magnesium: 3.4 mg/dL — ABNORMAL HIGH (ref 1.7–2.4)

## 2018-12-22 LAB — FIBRINOGEN: Fibrinogen: 173 mg/dL — ABNORMAL LOW (ref 210–475)

## 2018-12-22 LAB — HEMOGLOBIN AND HEMATOCRIT, BLOOD
HCT: 32.5 % — ABNORMAL LOW (ref 39.0–52.0)
Hemoglobin: 11.2 g/dL — ABNORMAL LOW (ref 13.0–17.0)

## 2018-12-22 SURGERY — BENTALL PROCEDURE
Anesthesia: General | Site: Chest

## 2018-12-22 MED ORDER — SODIUM CHLORIDE 0.9 % IV SOLN
1.5000 g | Freq: Two times a day (BID) | INTRAVENOUS | Status: AC
  Administered 2018-12-22 – 2018-12-24 (×4): 1.5 g via INTRAVENOUS
  Filled 2018-12-22 (×4): qty 1.5

## 2018-12-22 MED ORDER — NITROGLYCERIN 0.2 MG/ML ON CALL CATH LAB
INTRAVENOUS | Status: DC | PRN
  Administered 2018-12-22: 40 ug via INTRAVENOUS
  Administered 2018-12-22 (×2): 20 ug via INTRAVENOUS

## 2018-12-22 MED ORDER — OXYCODONE HCL 5 MG PO TABS
5.0000 mg | ORAL_TABLET | ORAL | Status: DC | PRN
  Administered 2018-12-23 – 2018-12-27 (×15): 5 mg via ORAL
  Filled 2018-12-22 (×15): qty 1

## 2018-12-22 MED ORDER — MAGNESIUM SULFATE 4 GM/100ML IV SOLN
4.0000 g | Freq: Once | INTRAVENOUS | Status: AC
  Administered 2018-12-22: 4 g via INTRAVENOUS
  Filled 2018-12-22: qty 100

## 2018-12-22 MED ORDER — PANTOPRAZOLE SODIUM 40 MG PO TBEC
40.0000 mg | DELAYED_RELEASE_TABLET | Freq: Every day | ORAL | Status: DC
  Administered 2018-12-24 – 2018-12-26 (×3): 40 mg via ORAL
  Filled 2018-12-22 (×3): qty 1

## 2018-12-22 MED ORDER — LACTATED RINGERS IV SOLN
INTRAVENOUS | Status: DC | PRN
  Administered 2018-12-22: 06:00:00 via INTRAVENOUS

## 2018-12-22 MED ORDER — SODIUM CHLORIDE 0.9% IV SOLUTION: Freq: Once | INTRAVENOUS | Status: AC

## 2018-12-22 MED ORDER — THROMBIN 5000 UNITS EX SOLR
CUTANEOUS | Status: AC
  Filled 2018-12-22: qty 10000

## 2018-12-22 MED ORDER — ASPIRIN 81 MG PO CHEW: 324.0000 mg | CHEWABLE_TABLET | Freq: Every day | ORAL | Status: DC

## 2018-12-22 MED ORDER — VECURONIUM BROMIDE 10 MG IV SOLR
INTRAVENOUS | Status: AC
  Filled 2018-12-22: qty 10

## 2018-12-22 MED ORDER — ALBUMIN HUMAN 5 % IV SOLN: 250.0000 mL | INTRAVENOUS | Status: DC | PRN

## 2018-12-22 MED ORDER — ACETAMINOPHEN 160 MG/5ML PO SOLN: 1000.0000 mg | Freq: Four times a day (QID) | ORAL | Status: DC

## 2018-12-22 MED ORDER — FENTANYL CITRATE (PF) 250 MCG/5ML IJ SOLN
INTRAMUSCULAR | Status: DC | PRN
  Administered 2018-12-22: 100 ug via INTRAVENOUS
  Administered 2018-12-22: 150 ug via INTRAVENOUS
  Administered 2018-12-22: 100 ug via INTRAVENOUS
  Administered 2018-12-22: 50 ug via INTRAVENOUS
  Administered 2018-12-22 (×3): 100 ug via INTRAVENOUS
  Administered 2018-12-22: 50 ug via INTRAVENOUS
  Administered 2018-12-22 (×5): 100 ug via INTRAVENOUS

## 2018-12-22 MED ORDER — ROCURONIUM BROMIDE 50 MG/5ML IV SOSY
PREFILLED_SYRINGE | INTRAVENOUS | Status: AC
  Filled 2018-12-22: qty 10

## 2018-12-22 MED ORDER — BISACODYL 5 MG PO TBEC
10.0000 mg | DELAYED_RELEASE_TABLET | Freq: Every day | ORAL | Status: DC
  Administered 2018-12-23 – 2018-12-25 (×3): 10 mg via ORAL
  Filled 2018-12-22 (×3): qty 2

## 2018-12-22 MED ORDER — NITROGLYCERIN IN D5W 200-5 MCG/ML-% IV SOLN
0.0000 ug/min | INTRAVENOUS | Status: DC
  Administered 2018-12-22: 5 ug/min via INTRAVENOUS

## 2018-12-22 MED ORDER — VECURONIUM BROMIDE 10 MG IV SOLR
INTRAVENOUS | Status: DC | PRN
  Administered 2018-12-22: 5 mg via INTRAVENOUS
  Administered 2018-12-22: 10 mg via INTRAVENOUS
  Administered 2018-12-22: 5 mg via INTRAVENOUS

## 2018-12-22 MED ORDER — TRAMADOL HCL 50 MG PO TABS
50.0000 mg | ORAL_TABLET | ORAL | Status: DC | PRN
  Administered 2018-12-24: 50 mg via ORAL
  Filled 2018-12-22: qty 1

## 2018-12-22 MED ORDER — PROPOFOL 10 MG/ML IV BOLUS
INTRAVENOUS | Status: AC
  Filled 2018-12-22: qty 20

## 2018-12-22 MED ORDER — DEXMEDETOMIDINE HCL IN NACL 200 MCG/50ML IV SOLN
0.0000 ug/kg/h | INTRAVENOUS | Status: DC
  Administered 2018-12-22: 0.7 ug/kg/h via INTRAVENOUS

## 2018-12-22 MED ORDER — DOCUSATE SODIUM 100 MG PO CAPS
200.0000 mg | ORAL_CAPSULE | Freq: Every day | ORAL | Status: DC
  Administered 2018-12-23 – 2018-12-26 (×4): 200 mg via ORAL
  Filled 2018-12-22 (×4): qty 2

## 2018-12-22 MED ORDER — CHLORHEXIDINE GLUCONATE 4 % EX LIQD: 30.0000 mL | CUTANEOUS | Status: DC

## 2018-12-22 MED ORDER — FAMOTIDINE IN NACL 20-0.9 MG/50ML-% IV SOLN
20.0000 mg | Freq: Two times a day (BID) | INTRAVENOUS | Status: AC
  Administered 2018-12-22: 20 mg via INTRAVENOUS

## 2018-12-22 MED ORDER — METOPROLOL TARTRATE 25 MG/10 ML ORAL SUSPENSION: 12.5000 mg | Freq: Two times a day (BID) | ORAL | Status: DC

## 2018-12-22 MED ORDER — METHYLPREDNISOLONE SODIUM SUCC 125 MG IJ SOLR
125.0000 mg | INTRAMUSCULAR | Status: AC
  Administered 2018-12-22: 125 mg via INTRAVENOUS
  Filled 2018-12-22: qty 2

## 2018-12-22 MED ORDER — CHLORHEXIDINE GLUCONATE 0.12 % MT SOLN
15.0000 mL | OROMUCOSAL | Status: AC
  Administered 2018-12-22: 15 mL via OROMUCOSAL

## 2018-12-22 MED ORDER — ASPIRIN EC 325 MG PO TBEC
325.0000 mg | DELAYED_RELEASE_TABLET | Freq: Every day | ORAL | Status: DC
  Administered 2018-12-23 – 2018-12-26 (×4): 325 mg via ORAL
  Filled 2018-12-22 (×4): qty 1

## 2018-12-22 MED ORDER — METOPROLOL TARTRATE 12.5 MG HALF TABLET: 12.5000 mg | ORAL_TABLET | Freq: Once | ORAL | Status: DC

## 2018-12-22 MED ORDER — SODIUM CHLORIDE (PF) 0.9 % IJ SOLN
INTRAMUSCULAR | Status: AC
  Filled 2018-12-22: qty 10

## 2018-12-22 MED ORDER — SODIUM CHLORIDE 0.9% FLUSH
3.0000 mL | Freq: Two times a day (BID) | INTRAVENOUS | Status: DC
  Administered 2018-12-23 – 2018-12-24 (×3): 3 mL via INTRAVENOUS
  Administered 2018-12-24: 10 mL via INTRAVENOUS
  Administered 2018-12-25 – 2018-12-26 (×4): 3 mL via INTRAVENOUS

## 2018-12-22 MED ORDER — LACTATED RINGERS IV SOLN
INTRAVENOUS | Status: DC
  Administered 2018-12-22 – 2018-12-23 (×2): via INTRAVENOUS

## 2018-12-22 MED ORDER — LACTATED RINGERS IV SOLN
INTRAVENOUS | Status: DC | PRN
  Administered 2018-12-22: 07:00:00 via INTRAVENOUS

## 2018-12-22 MED ORDER — TAMSULOSIN HCL 0.4 MG PO CAPS
0.4000 mg | ORAL_CAPSULE | Freq: Every day | ORAL | Status: DC
  Administered 2018-12-23 – 2018-12-26 (×4): 0.4 mg via ORAL
  Filled 2018-12-22 (×4): qty 1

## 2018-12-22 MED ORDER — INSULIN REGULAR BOLUS VIA INFUSION
0.0000 [IU] | Freq: Three times a day (TID) | INTRAVENOUS | Status: DC
  Filled 2018-12-22: qty 10

## 2018-12-22 MED ORDER — INSULIN REGULAR(HUMAN) IN NACL 100-0.9 UT/100ML-% IV SOLN
INTRAVENOUS | Status: DC
  Administered 2018-12-22: 0.2 [IU]/h via INTRAVENOUS

## 2018-12-22 MED ORDER — MIDAZOLAM HCL 5 MG/5ML IJ SOLN
INTRAMUSCULAR | Status: DC | PRN
  Administered 2018-12-22 (×2): 2 mg via INTRAVENOUS
  Administered 2018-12-22: 1 mg via INTRAVENOUS
  Administered 2018-12-22 (×4): 2 mg via INTRAVENOUS
  Administered 2018-12-22: 3 mg via INTRAVENOUS

## 2018-12-22 MED ORDER — POTASSIUM CHLORIDE 10 MEQ/50ML IV SOLN
10.0000 meq | Freq: Once | INTRAVENOUS | Status: AC
  Administered 2018-12-22: 10 meq via INTRAVENOUS
  Filled 2018-12-22: qty 50

## 2018-12-22 MED ORDER — POTASSIUM CHLORIDE 10 MEQ/50ML IV SOLN
10.0000 meq | INTRAVENOUS | Status: AC
  Administered 2018-12-22 (×3): 10 meq via INTRAVENOUS

## 2018-12-22 MED ORDER — ACETAMINOPHEN 160 MG/5ML PO SOLN: 650.0000 mg | Freq: Once | ORAL | Status: AC

## 2018-12-22 MED ORDER — SODIUM CHLORIDE 0.9% FLUSH: 3.0000 mL | INTRAVENOUS | Status: DC | PRN

## 2018-12-22 MED ORDER — LACTATED RINGERS IV SOLN
INTRAVENOUS | Status: DC | PRN
  Administered 2018-12-22 (×2): via INTRAVENOUS

## 2018-12-22 MED ORDER — SODIUM CHLORIDE 0.9 % IV SOLN: 250.0000 mL | INTRAVENOUS | Status: DC

## 2018-12-22 MED ORDER — PROTAMINE SULFATE 10 MG/ML IV SOLN
INTRAVENOUS | Status: AC
  Filled 2018-12-22: qty 10

## 2018-12-22 MED ORDER — ONDANSETRON HCL 4 MG/2ML IJ SOLN
4.0000 mg | Freq: Four times a day (QID) | INTRAMUSCULAR | Status: DC | PRN
  Administered 2018-12-23: 4 mg via INTRAVENOUS
  Filled 2018-12-22: qty 2

## 2018-12-22 MED ORDER — CHLORHEXIDINE GLUCONATE 0.12% ORAL RINSE (MEDLINE KIT)
15.0000 mL | Freq: Two times a day (BID) | OROMUCOSAL | Status: DC
  Administered 2018-12-22: 15 mL via OROMUCOSAL

## 2018-12-22 MED ORDER — BISACODYL 10 MG RE SUPP: 10.0000 mg | Freq: Every day | RECTAL | Status: DC

## 2018-12-22 MED ORDER — PROTAMINE SULFATE 10 MG/ML IV SOLN
INTRAVENOUS | Status: DC | PRN
  Administered 2018-12-22: 280 mg via INTRAVENOUS

## 2018-12-22 MED ORDER — METOPROLOL TARTRATE 12.5 MG HALF TABLET: 12.5000 mg | ORAL_TABLET | Freq: Two times a day (BID) | ORAL | Status: DC

## 2018-12-22 MED ORDER — ALBUMIN HUMAN 5 % IV SOLN
INTRAVENOUS | Status: DC | PRN
  Administered 2018-12-22: 13:00:00 via INTRAVENOUS

## 2018-12-22 MED ORDER — 0.9 % SODIUM CHLORIDE (POUR BTL) OPTIME
TOPICAL | Status: DC | PRN
  Administered 2018-12-22: 6000 mL

## 2018-12-22 MED ORDER — ACETAMINOPHEN 500 MG PO TABS
1000.0000 mg | ORAL_TABLET | Freq: Four times a day (QID) | ORAL | Status: DC
  Administered 2018-12-23 – 2018-12-27 (×17): 1000 mg via ORAL
  Filled 2018-12-22 (×17): qty 2

## 2018-12-22 MED ORDER — PROPOFOL 10 MG/ML IV BOLUS
INTRAVENOUS | Status: DC | PRN
  Administered 2018-12-22: 100 mg via INTRAVENOUS
  Administered 2018-12-22: 30 mg via INTRAVENOUS
  Administered 2018-12-22: 20 mg via INTRAVENOUS
  Administered 2018-12-22: 30 mg via INTRAVENOUS
  Administered 2018-12-22: 40 mg via INTRAVENOUS
  Administered 2018-12-22: 30 mg via INTRAVENOUS
  Administered 2018-12-22: 40 mg via INTRAVENOUS

## 2018-12-22 MED ORDER — MIDAZOLAM HCL (PF) 10 MG/2ML IJ SOLN
INTRAMUSCULAR | Status: AC
  Filled 2018-12-22: qty 2

## 2018-12-22 MED ORDER — METOPROLOL TARTRATE 5 MG/5ML IV SOLN: 2.5000 mg | INTRAVENOUS | Status: DC | PRN

## 2018-12-22 MED ORDER — FENTANYL CITRATE (PF) 250 MCG/5ML IJ SOLN
INTRAMUSCULAR | Status: AC
  Filled 2018-12-22: qty 25

## 2018-12-22 MED ORDER — PHENYLEPHRINE HCL-NACL 20-0.9 MG/250ML-% IV SOLN: 0.0000 ug/min | INTRAVENOUS | Status: DC

## 2018-12-22 MED ORDER — PHENYLEPHRINE 40 MCG/ML (10ML) SYRINGE FOR IV PUSH (FOR BLOOD PRESSURE SUPPORT)
PREFILLED_SYRINGE | INTRAVENOUS | Status: DC | PRN
  Administered 2018-12-22: 40 ug via INTRAVENOUS

## 2018-12-22 MED ORDER — VANCOMYCIN HCL IN DEXTROSE 1-5 GM/200ML-% IV SOLN
1000.0000 mg | Freq: Once | INTRAVENOUS | Status: AC
  Administered 2018-12-22: 1000 mg via INTRAVENOUS

## 2018-12-22 MED ORDER — SODIUM CHLORIDE 0.9 % IV SOLN
INTRAVENOUS | Status: DC | PRN
  Administered 2018-12-22: 750 mg via INTRAVENOUS

## 2018-12-22 MED ORDER — PROTAMINE SULFATE 10 MG/ML IV SOLN
INTRAVENOUS | Status: AC
  Filled 2018-12-22: qty 25

## 2018-12-22 MED ORDER — ROCURONIUM BROMIDE 10 MG/ML (PF) SYRINGE
PREFILLED_SYRINGE | INTRAVENOUS | Status: DC | PRN
  Administered 2018-12-22: 100 mg via INTRAVENOUS

## 2018-12-22 MED ORDER — FENTANYL CITRATE (PF) 100 MCG/2ML IJ SOLN
25.0000 ug | INTRAMUSCULAR | Status: DC | PRN
  Administered 2018-12-22 – 2018-12-24 (×9): 25 ug via INTRAVENOUS
  Filled 2018-12-22 (×10): qty 2

## 2018-12-22 MED ORDER — ACETAMINOPHEN 650 MG RE SUPP
650.0000 mg | Freq: Once | RECTAL | Status: AC
  Administered 2018-12-22: 650 mg via RECTAL

## 2018-12-22 MED ORDER — HEMOSTATIC AGENTS (NO CHARGE) OPTIME
TOPICAL | Status: DC | PRN
  Administered 2018-12-22: 3 via TOPICAL
  Administered 2018-12-22: 1 via TOPICAL

## 2018-12-22 MED ORDER — MIDAZOLAM HCL 2 MG/2ML IJ SOLN
2.0000 mg | INTRAMUSCULAR | Status: DC | PRN
  Administered 2018-12-22: 2 mg via INTRAVENOUS
  Filled 2018-12-22 (×2): qty 2

## 2018-12-22 MED ORDER — SODIUM CHLORIDE 0.9 % IV SOLN
INTRAVENOUS | Status: DC | PRN
  Administered 2018-12-22: 14:00:00 via INTRAVENOUS

## 2018-12-22 MED ORDER — LACTATED RINGERS IV SOLN
INTRAVENOUS | Status: DC
  Administered 2018-12-22: 15:00:00 via INTRAVENOUS

## 2018-12-22 MED ORDER — SODIUM CHLORIDE 0.9 % IV SOLN
INTRAVENOUS | Status: DC
  Administered 2018-12-22: 15:00:00 via INTRAVENOUS

## 2018-12-22 MED ORDER — CHLORHEXIDINE GLUCONATE 0.12 % MT SOLN
15.0000 mL | Freq: Once | OROMUCOSAL | Status: AC
  Administered 2018-12-22: 15 mL via OROMUCOSAL
  Filled 2018-12-22: qty 15

## 2018-12-22 MED ORDER — MIDAZOLAM HCL 2 MG/2ML IJ SOLN
INTRAMUSCULAR | Status: AC
  Filled 2018-12-22: qty 2

## 2018-12-22 MED ORDER — ORAL CARE MOUTH RINSE
15.0000 mL | OROMUCOSAL | Status: DC
  Administered 2018-12-22 – 2018-12-23 (×5): 15 mL via OROMUCOSAL

## 2018-12-22 MED ORDER — HEPARIN SODIUM (PORCINE) 1000 UNIT/ML IJ SOLN
INTRAMUSCULAR | Status: DC | PRN
  Administered 2018-12-22: 28000 [IU] via INTRAVENOUS

## 2018-12-22 MED ORDER — SODIUM CHLORIDE 0.45 % IV SOLN
INTRAVENOUS | Status: DC | PRN
  Administered 2018-12-22: 15:00:00 via INTRAVENOUS

## 2018-12-22 MED ORDER — HEPARIN SODIUM (PORCINE) 1000 UNIT/ML IJ SOLN
INTRAMUSCULAR | Status: AC
  Filled 2018-12-22: qty 1

## 2018-12-22 MED ORDER — THROMBIN 5000 UNITS EX SOLR
CUTANEOUS | Status: DC | PRN
  Administered 2018-12-22: 10000 [IU] via TOPICAL

## 2018-12-22 MED ORDER — LACTATED RINGERS IV SOLN: 500.0000 mL | Freq: Once | INTRAVENOUS | Status: DC | PRN

## 2018-12-22 SURGICAL SUPPLY — 90 items
ADAPTER CARDIO PERF ANTE/RETRO (ADAPTER) ×8 IMPLANT
APPLICATOR TIP COSEAL (VASCULAR PRODUCTS) ×8 IMPLANT
ATTRACTOMAT 16X20 MAGNETIC DRP (DRAPES) IMPLANT
BLADE STERNUM SYSTEM 6 (BLADE) ×4 IMPLANT
BLADE SURG 15 STRL LF DISP TIS (BLADE) ×2 IMPLANT
BLADE SURG 15 STRL SS (BLADE) ×2
CANISTER SUCT 3000ML PPV (MISCELLANEOUS) ×4 IMPLANT
CANNULA AORTIC ROOT 9FR (CANNULA) ×4 IMPLANT
CANNULA ARTERIAL VENT 3/8 20FR (CANNULA) ×4 IMPLANT
CANNULA GUNDRY RCSP 15FR (MISCELLANEOUS) ×12 IMPLANT
CANNULA MC2 2 STG 36/46 NON-V (CANNULA) ×2 IMPLANT
CANNULA SUMP PERICARDIAL (CANNULA) ×4 IMPLANT
CANNULA VENOUS 2 STG 34/46 (CANNULA) ×2
CATH HEART VENT LEFT (CATHETERS) ×4 IMPLANT
CATH ROBINSON RED A/P 18FR (CATHETERS) ×20 IMPLANT
CATH THORACIC 36FR (CATHETERS) ×4 IMPLANT
CATH THORACIC 36FR RT ANG (CATHETERS) ×4 IMPLANT
CAUTERY HIGH TEMP VAS (MISCELLANEOUS) ×4 IMPLANT
CONT SPEC 4OZ CLIKSEAL STRL BL (MISCELLANEOUS) ×12 IMPLANT
COVER SURGICAL LIGHT HANDLE (MISCELLANEOUS) IMPLANT
COVER WAND RF STERILE (DRAPES) ×4 IMPLANT
CRADLE DONUT ADULT HEAD (MISCELLANEOUS) IMPLANT
DRAPE SLUSH/WARMER DISC (DRAPES) IMPLANT
DRSG COVADERM 4X14 (GAUZE/BANDAGES/DRESSINGS) ×4 IMPLANT
ELECT CAUTERY BLADE 6.4 (BLADE) ×4 IMPLANT
ELECT REM PT RETURN 9FT ADLT (ELECTROSURGICAL) ×8
ELECTRODE REM PT RTRN 9FT ADLT (ELECTROSURGICAL) ×4 IMPLANT
FELT TEFLON 1X6 (MISCELLANEOUS) ×8 IMPLANT
GAUZE SPONGE 4X4 12PLY STRL (GAUZE/BANDAGES/DRESSINGS) ×4 IMPLANT
GAUZE SPONGE 4X4 12PLY STRL LF (GAUZE/BANDAGES/DRESSINGS) ×4 IMPLANT
GLOVE BIO SURGEON STRL SZ 6 (GLOVE) ×8 IMPLANT
GLOVE BIO SURGEON STRL SZ 6.5 (GLOVE) IMPLANT
GLOVE BIO SURGEON STRL SZ7 (GLOVE) IMPLANT
GLOVE BIO SURGEON STRL SZ7.5 (GLOVE) IMPLANT
GLOVE BIO SURGEONS STRL SZ 6.5 (GLOVE)
GLOVE EUDERMIC 7 POWDERFREE (GLOVE) ×8 IMPLANT
GLOVE SURG SS PI 6.0 STRL IVOR (GLOVE) ×4 IMPLANT
GOWN STRL REUS W/ TWL LRG LVL3 (GOWN DISPOSABLE) ×8 IMPLANT
GOWN STRL REUS W/ TWL XL LVL3 (GOWN DISPOSABLE) ×2 IMPLANT
GOWN STRL REUS W/TWL LRG LVL3 (GOWN DISPOSABLE) ×8
GOWN STRL REUS W/TWL XL LVL3 (GOWN DISPOSABLE) ×2
GRAFT GELWEAVE VALSALVA 26CM (Prosthesis & Implant Heart) ×4 IMPLANT
GRAFT HEMASHIELD 30X10 (Vascular Products) ×4 IMPLANT
HEMOSTAT POWDER SURGIFOAM 1G (HEMOSTASIS) ×12 IMPLANT
HEMOSTAT SURGICEL 2X14 (HEMOSTASIS) ×4 IMPLANT
KIT BASIN OR (CUSTOM PROCEDURE TRAY) ×4 IMPLANT
KIT SUCTION CATH 14FR (SUCTIONS) ×4 IMPLANT
KIT TURNOVER KIT B (KITS) ×4 IMPLANT
LINE VENT (MISCELLANEOUS) ×4 IMPLANT
LOOP VESSEL SUPERMAXI WHITE (MISCELLANEOUS) ×8 IMPLANT
NDL SUT 1 .5 CRC FRENCH EYE (NEEDLE) ×2 IMPLANT
NEEDLE FRENCH EYE (NEEDLE) ×2
NS IRRIG 1000ML POUR BTL (IV SOLUTION) ×20 IMPLANT
PACK E OPEN HEART (SUTURE) ×4 IMPLANT
PACK OPEN HEART (CUSTOM PROCEDURE TRAY) ×4 IMPLANT
PAD ARMBOARD 7.5X6 YLW CONV (MISCELLANEOUS) ×8 IMPLANT
SEALANT SURG COSEAL 8ML (VASCULAR PRODUCTS) ×4 IMPLANT
SET CARDIOPLEGIA MPS 5001102 (MISCELLANEOUS) ×4 IMPLANT
SET VEIN GRAFT PERF (SET/KITS/TRAYS/PACK) ×4 IMPLANT
SUT BONE WAX W31G (SUTURE) ×4 IMPLANT
SUT ETHIBON 2 0 V 52N 30 (SUTURE) ×8 IMPLANT
SUT ETHIBON EXCEL 2-0 V-5 (SUTURE) ×4 IMPLANT
SUT ETHIBOND V-5 VALVE (SUTURE) IMPLANT
SUT PROLENE 3 0 SH 1 (SUTURE) ×4 IMPLANT
SUT PROLENE 3 0 SH 48 (SUTURE) ×12 IMPLANT
SUT PROLENE 3 0 SH DA (SUTURE) IMPLANT
SUT PROLENE 3 0 SH1 36 (SUTURE) ×8 IMPLANT
SUT PROLENE 4 0 RB 1 (SUTURE) ×14
SUT PROLENE 4-0 RB1 .5 CRCL 36 (SUTURE) ×14 IMPLANT
SUT PROLENE 5 0 C 1 36 (SUTURE) ×8 IMPLANT
SUT PROLENE 5 0 RB 2 (SUTURE) ×8 IMPLANT
SUT SILK 2 0 SH CR/8 (SUTURE) ×4 IMPLANT
SUT STEEL 6MS V (SUTURE) ×8 IMPLANT
SUT STEEL STERNAL CCS#1 18IN (SUTURE) IMPLANT
SUT STEEL SZ 6 DBL 3X14 BALL (SUTURE) IMPLANT
SUT VIC AB 1 CTX 36 (SUTURE) ×4
SUT VIC AB 1 CTX36XBRD ANBCTR (SUTURE) ×4 IMPLANT
SUT VIC AB 2-0 CT1 27 (SUTURE)
SUT VIC AB 2-0 CT1 TAPERPNT 27 (SUTURE) IMPLANT
SUT VIC AB 3-0 X1 27 (SUTURE) IMPLANT
SYSTEM SAHARA CHEST DRAIN ATS (WOUND CARE) ×4 IMPLANT
TAPE CLOTH SURG 4X10 WHT LF (GAUZE/BANDAGES/DRESSINGS) ×4 IMPLANT
TOWEL GREEN STERILE (TOWEL DISPOSABLE) ×4 IMPLANT
TOWEL GREEN STERILE FF (TOWEL DISPOSABLE) ×4 IMPLANT
TRAY FOLEY SLVR 14FR TEMP STAT (SET/KITS/TRAYS/PACK) ×4 IMPLANT
UNDERPAD 30X30 (UNDERPADS AND DIAPERS) ×4 IMPLANT
VALVE MAGNA EASE AORTIC 23MM (Prosthesis & Implant Heart) ×4 IMPLANT
VENT LEFT HEART 12002 (CATHETERS) ×8
WATER STERILE IRR 1000ML POUR (IV SOLUTION) ×8 IMPLANT
YANKAUER SUCT BULB TIP NO VENT (SUCTIONS) ×4 IMPLANT

## 2018-12-22 NOTE — Procedures (Signed)
Extubation Procedure Note  Pt completed Cardiac Wean without event. NIF -28, VC 0.85L, Cuff leak +, parameters met, ABG +, No Stridor. Pt extubated to 3L [place].   Patient Details:   Name: Cameron [name] DOB: 1956/10/23 MRN: [id number]   Airway Documentation:    Vent end date: 12/22/18 Vent end time: 2050   Evaluation  O2 sats: stable throughout Complications: No apparent complications Patient did tolerate procedure well. Bilateral Breath Sounds: Clear, Diminished   Yes  Marissa Nestle 12/22/2018, 8:53 PM

## 2018-12-22 NOTE — Anesthesia Procedure Notes (Signed)
Central Venous Catheter Insertion Performed by: Lewie LoronGermeroth, Dalaysia Harms, MD, anesthesiologist Start/End12/30/2019 6:50 AM, 12/22/2018 7:00 AM Patient location: Pre-op. Preanesthetic checklist: patient identified, IV checked, site marked, risks and benefits discussed, surgical consent, monitors and equipment checked, pre-op evaluation, timeout performed and anesthesia consent Hand hygiene performed  and maximum sterile barriers used  PA cath was placed.Swan type:thermodilution PA Cath depth:40 Procedure performed without using ultrasound guided technique. Attempts: 1 Patient tolerated the procedure well with no immediate complications.

## 2018-12-22 NOTE — Transfer of Care (Signed)
Immediate Anesthesia Transfer of Care Note  Patient: Cameron SpenceCharles R [name]  Procedure(s) Performed: BENTALL PROCEDURE (N/A Chest) REPLACEMENT ASCENDING AORTA (N/A ) TRANSESOPHAGEAL ECHOCARDIOGRAM (TEE) (N/A )  Patient Location: ICU  Anesthesia Type:General  Level of Consciousness: sedated and Patient remains intubated per anesthesia plan  Airway & Oxygen Therapy: Patient remains intubated per anesthesia plan and Patient placed on Ventilator (see vital sign flow sheet for setting)  Post-op Assessment: Report given to RN and Post -op Vital signs reviewed and stable  Post vital signs: Reviewed and stable  Last Vitals:  Vitals Value Taken Time  BP 94/67 12/22/2018  2:36 PM  Temp 36.1 C 12/22/2018  2:43 PM  Pulse 81 12/22/2018  2:43 PM  Resp 23 12/22/2018  2:43 PM  SpO2 98 % 12/22/2018  2:43 PM  Vitals shown include unvalidated device data.  Last Pain:  Vitals:   12/22/18 0557  TempSrc:   PainSc: 0-No pain         Complications: No apparent anesthesia complications

## 2018-12-22 NOTE — Anesthesia Procedure Notes (Signed)
Arterial Line Insertion Start/End12/30/2019 6:40 AM, 12/22/2018 6:45 AM Performed by: Waynard EdwardsSmith, Jeyli Zwicker A, CRNA, CRNA  Patient location: Pre-op. Preanesthetic checklist: patient identified, IV checked, site marked, risks and benefits discussed, surgical consent, monitors and equipment checked, pre-op evaluation, timeout performed and anesthesia consent Lidocaine 1% used for infiltration and patient sedated Left, radial was placed Catheter size: 20 G Hand hygiene performed , maximum sterile barriers used  and Seldinger technique used Allen's test indicative of satisfactory collateral circulation Attempts: 1 Procedure performed without using ultrasound guided technique. Following insertion, dressing applied and Biopatch. Post procedure assessment: normal and unchanged  Patient tolerated the procedure well with no immediate complications.

## 2018-12-22 NOTE — Progress Notes (Signed)
CT surgery p.m. Rounds  Patient hemodynamically stable without significant chest tube output after biologic Bentall procedure using circulatory arrest.  First attempt at ventilator wean resulted in patient being agitated not following commands but no focal weakness Attempt to wean ventilator will be made later Postoperative labs satisfactory Lungs clear Extremities warm

## 2018-12-22 NOTE — Brief Op Note (Signed)
12/22/2018  9:45 AM  PATIENT:  Cameron [name]  62 y.o. male  PRE-OPERATIVE DIAGNOSIS:  SEVERE AS ASCENDING AORTIC ANEURYSM  POST-OPERATIVE DIAGNOSIS:  SEVERE AS ASCENDING AORTIC ANEURYSM  PROCEDURE:  Procedure(s) with comments: BENTALL PROCEDURE (N/A) - NEEDS BILATERAL RADIAL ARTERIAL LINES REPLACEMENT ASCENDING AORTA (N/A) TRANSESOPHAGEAL ECHOCARDIOGRAM (TEE) (N/A)  SURGEON:  Surgeon(s) and Role:    * Bartle, Payton DoughtyBryan K, MD - Primary  PHYSICIAN ASSISTANT:  Jari Favreessa Conte, PA-C   ANESTHESIA:   general  EBL:  600 mL   BLOOD ADMINISTERED:none  DRAINS: ROUTINE   LOCAL MEDICATIONS USED:  NONE  SPECIMEN:  Source of Specimen:  AORTIC ANEURYSM  DISPOSITION OF SPECIMEN:  PATHOLOGY  COUNTS:  YES  DICTATION: .Dragon Dictation  PLAN OF CARE: Admit to inpatient   PATIENT DISPOSITION:  ICU - intubated and hemodynamically stable.   Delay start of Pharmacological VTE agent (>24hrs) due to surgical blood loss or risk of bleeding: yes

## 2018-12-22 NOTE — Anesthesia Procedure Notes (Signed)
Central Venous Catheter Insertion Performed by: Lewie LoronGermeroth, Francisca Langenderfer, MD, anesthesiologist Start/End12/30/2019 6:35 AM, 12/22/2018 6:50 AM Patient location: Pre-op. Preanesthetic checklist: patient identified, IV checked, site marked, risks and benefits discussed, surgical consent, monitors and equipment checked, pre-op evaluation, timeout performed and anesthesia consent Position: Trendelenburg Lidocaine 1% used for infiltration and patient sedated Hand hygiene performed , maximum sterile barriers used  and Seldinger technique used Catheter size: 9 Fr Total catheter length 10. Central line was placed.Sheath introducer Swan type:thermodilution PA Cath depth:40 Procedure performed using ultrasound guided technique. Ultrasound Notes:anatomy identified, needle tip was noted to be adjacent to the nerve/plexus identified, no ultrasound evidence of intravascular and/or intraneural injection and image(s) printed for medical record Attempts: 1 Following insertion, line sutured, dressing applied and Biopatch. Post procedure assessment: blood return through all ports, free fluid flow and no air  Patient tolerated the procedure well with no immediate complications.

## 2018-12-22 NOTE — Op Note (Signed)
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   Postoperative Diagnosis:  Same   Procedure:  1. Median Sternotomy 2. Extracorporeal circulation 3.   Replacement of the ascending aorta (hemi-arch) using a 30 mm Hemashield graft under deep hypothermic circulatory arrest 4.   Biological Bentall Procedure using a 23 mm Edwards Magna-Ease pericardial valve and a 26 mm Gelweave Valsalva graft.  Anesthesia:  General Endotracheal   Clinical History/Surgical Indication:  This 62 year old gentleman has a history of a bicuspid aortic valve that has become progressively more calcified. The mean gradient across his valve is 19 mmHg although his valve looks severely stenotic. The aortic valve area is measured at 0.85 cm by V-max.He also has a 4.9 cm fusiform ascending aortic aneurysm. The diameter of the proximal arch at the takeoff of the innominate artery is 4.0 cm. The aorta decreases to 3 cm in diameter between the innominate and left carotid arteries.I am concerned about the appearance of his aortic valve as well as the development of multiple episodes of dizziness and blurred vision with mild exertion. He has New York Heart Association class III symptoms of exertional fatigue and shortness of breath consistent with chronic diastolic congestive heart failure and his echocardiogram shows grade 1 diastolic dysfunction with Doppler parameters suggesting high ventricular filling pressure. I think it would be best to proceed with aortic valve replacement and replacement of his ascending aortic aneurysm. I reviewed the echocardiogram and CT images with him and his wife and answered all their questions.  Cardiac catheterization shows no significant coronary disease.I discussed the alternatives of mechanical and  bioprosthetic aortic valves. He and his wife would like to use a bioprosthetic valve to avoid the need for Coumadin and I think that is a reasonable option at his age.  We will plan to proceed with Bentall procedure using a bioprosthetic valved graft and replacement of the ascending aortic aneurysm using deep hypothermic circulatory arrest. I discussed the operative procedure with the patient and his wife including alternatives, benefits and risks; including but not limited to bleeding, blood transfusion, infection, stroke, myocardial infarction, graft failure, heart block requiring a permanent pacemaker, organ dysfunction, and death.  Delila Spence understands and agrees to proceed.    Preparation:  The patient was seen in the preoperative holding area and the correct patient, correct operation were confirmed with the patient after reviewing the medical record and catheterization. The consent was signed by me. Preoperative antibiotics were given. A pulmonary arterial line and radial arterial line were placed by the anesthesia team. The patient was taken back to the operating room and positioned supine on the operating room table. After being placed under general endotracheal anesthesia by the anesthesia team a foley catheter was placed. The neck, chest, abdomen, and both legs were prepped with betadine soap and solution and draped in the usual sterile manner. A surgical time-out was taken and the correct patient and operative procedure were confirmed with the nursing and anesthesia staff.  TEE:  Performed by Dr. Leslye Peer.  Result status: Final result   Left atrium: Cavity is mildly dilated. No spontaneous echo contrast.  Aortic valve: The valve is possible bicuspid. Severe valve calcification present. Moderately decreased leaflet separation. Moderate stenosis. Trace regurgitation.  Aorta: The ascending aorta is dilated.  Mitral valve: Trace regurgitation.  Right ventricle: Right ventricle  not well visualized. Normal cavity size, wall thickness and ejection fraction.  Aorta: Graft present in the ascending aorta.    Cardiopulmonary Bypass:  A median sternotomy was performed. The pericardium was opened in the midline. Right ventricular function appeared normal. The ascending aorta was of normal size and had no palpable plaque. There were no contraindications to aortic cannulation or cross-clamping. The patient was fully systemically heparinized and the ACT was maintained > 400 sec. The distal ascending aorta was cannulated with a 20 F aortic cannula for arterial inflow. Venous cannulation was performed via the right atrial appendage using a two-staged venous cannula. Aortic occlusion was performed with a single cross-clamp. Systemic cooling to 18 degrees Centigrade and topical cooling of the heart with iced saline were used. Hyperkalemic retrograde cold blood cardioplegia was used to induce diastolic arrest and was then given both into the coronary ostia using a handheld cannula and retrograde at about 20 minute intervals throughout the period of arrest to maintain myocardial temperature at or below 10 degrees centigrade. A temperature probe was inserted into the interventricular septum and an insulating pad was placed in the pericardium. CO2 was insufflated into the pericardium throughout the case to minimize intracardiac air.    Resection and grafting of ascending aortic aneurysm:  The patient was placed on cardiopulmonary bypass and a left ventricular vent was placed via the right superior pulmonary vein. Systemic cooling was begun with a goal temperature of 20 degrees centigrade by bladder and rectal temperature probes. A retrograde cardioplegia cannula was placed through the right atrium into the coronary sinus without difficulty. A retrograde cerebral perfusion cannula was placed into the SVC through a pursestring suture and the SVC was encircled with a silastic tape.   After 30  minutes of cooling the target temperature of 20 degrees centigrade was reached. Cerebral oximetry was 70% bilaterally. BIS was zero. The patient was given Propofol and 125 mg of Solumedrol by anesthesia. The head was packed in ice. The bed was placed in steep trendelenburg. Circulatory arrest was begun and the blood volume emptied into the venous reservoir. Continuous retrograde cerebral perfusion was begun and the SVC occluded with the silastic tape. Cold blood retrograde cardioplegia was given and myocardial temperature dropped to 10 degrees centigrade. Additional doses were given at approximately 20 minute intervals throughout the period of circulatory arrest and cross-clamping. Complete diastolic arrest was maintained. The aortic cannula was removed. The aorta was transected just proximal to the innominate artery beveling the resection out along the undersurface of the aortic arch (Hemiarch replacement). The aortic diameter was measured at 35 mm here. A 30 x 10 mm Hemasheild Platinum vascular graft was prepared. ( Catalog # [id number] P0, Lot [id number], SN [id number]). It was anastomosed to the aortic arch in an end to end manner using 3-0 prolene continuous suture with a felt strip to reinforce the anastomisis. A light coating of CoSeal was applied to seal needle holes. The arterial end of the bypass circuit was then connected to the 10mm side arm graft and circulation was slowly resumed. The tape was removed from the SVC. The aortic graft was cross-clamped proximal to the side arm graft and full CPB support was resumed. Circulatory arrest time was 29 minutes. Retrograde cerebral perfusion time was 20 minutes.   Bentall Procedure:   The ascending aorta was mobilized from the right pulmonary artery and main PA. It was opened longitudinally and the valve inspected. It was a bicuspid valve with fusion of the right and non-coronary cusps  with 3 commissures. There was bulky calcification of both leaflets and  moderate annular calcification. The right and left coronary arteries were removed from the aortic root with a button of aortic wall around the ostia. They were retracted carefully out of the way with stay sutures to prevent rotation. The native valve was excised taking care to remove all particulate debri. The annulus was decalcified with rongeurs. The annulus was sized and a 23 mm Edwards Magna-Ease pericardial valve was chosen. ( Model # [id number], Serial # [id number]). A series of pledgetted 2-0 Ethibond horizontal mattress sutures were placed around the annulus with the pledgets in a sub-annular position. A 30 mm Gelweave Valsalva graft was chosen ( Ref # [id number] ADP, Lot # [id number], SN [id number]). The proximal cuff was trimmed so that there were 3 rings remaining. The valve was placed in the graft so that the sewing cuff was adjacent to the proximal graft cuff. The commissural posts were lined up with the vertical marker sutures on the graft and the valve and graft were held in position using a 5-0 prolene suture at each commissure. The sutures were placed through a strip of autologous pericardium to reinforce the annulus and then the valve sewing ring followed by the proximal graft cuff. The valve was lowered into place and the sutures tied . The valve seated nicely.  Small openings were made in the graft for the coronary anastomoses using a thermal cautery. Then the left and right coronary buttons were anastomosed to the graft in an end to side manner using continuous 5-0 prolene suture. A light coating of CoSeal was applied to each anastomosis for hemostasis. The two grafts were then cut to the appropriate length and anastomosed end to end using continuous 3-0 prolene suture. CoSeal was applied to seal the needle holes in the grafts. A vent cannula was placed into the graft to remove any air. Deairing maneuvers were performed and the bed placed in trendelenburg position.   Completion:   The patient was  rewarmed to 37 degrees Centigrade. The crossclamp was removed with a time of 153 minutes. There was spontaneous return of sinus rhythm. The position of the grafts was satisfactory. The vascular anastomoses all appeared hemostatic. Two temporary epicardial pacing wires were placed on the right atrium and two on the right ventricle. The patient was weaned from CPB without difficulty on no inotropes. CPB time was 233 minutes. Cardiac output was 4 LPM. TEE showed a normal functioning aortic valve prosthesis with no AI. There was unchanged trace MR.  LV function appeared normal. Heparin was fully reversed with protamine and the aortic and venous cannulas removed. Hemostasis was achieved. Mediastinal drainage tubes were placed. The sternum was closed with  #6 stainless steel wires. The fascia was closed with continuous # 1 vicryl suture. The subcutaneous tissue was closed with 2-0 vicryl continuous suture. The skin was closed with 3-0 vicryl subcuticular suture. All sponge, needle, and instrument counts were reported correct at the end of the case. Dry sterile dressings were placed over the incisions and around the chest tubes which were connected to pleurevac suction. The patient was then transported to the surgical intensive care unit in  stable condition.   Post-Op TEE AORTA A graft was placed in the ascending aorta for repair.  LEFT VENTRICLE Left ventricle unchanged from pre-bypass.  RIGHT VENTRICLE Right ventricle unchanged from pre-bypass.  AORTIC VALVE A bioprosthetic valve was placed. Valve is well seated. Leaflet is thin and freely mobile. paravalvular leak and  MITRAL VALVE Mitral valve unchanged from pre-bypass.  TRICUSPID VALVE Tricuspid valve unchanged from pre-bypass.

## 2018-12-22 NOTE — Anesthesia Procedure Notes (Signed)
Procedure Name: Intubation Date/Time: 12/22/2018 7:50 AM Performed by: Waynard EdwardsSmith, Petra Sargeant A, CRNA Pre-anesthesia Checklist: Patient identified, Suction available, Emergency Drugs available and Patient being monitored Patient Re-evaluated:Patient Re-evaluated prior to induction Oxygen Delivery Method: Circle system utilized Preoxygenation: Pre-oxygenation with 100% oxygen Induction Type: IV induction Ventilation: Mask ventilation without difficulty and Oral airway inserted - appropriate to patient size Laryngoscope Size: Hyacinth MeekerMiller and 2 Grade View: Grade I Tube type: Oral Tube size: 8.0 mm Number of attempts: 1 Airway Equipment and Method: Stylet Placement Confirmation: ETT inserted through vocal cords under direct vision,  positive ETCO2 and breath sounds checked- equal and bilateral Secured at: 23 cm Tube secured with: Tape Dental Injury: Teeth and Oropharynx as per pre-operative assessment

## 2018-12-22 NOTE — Interval H&P Note (Signed)
History and Physical Interval Note:  12/22/2018 7:17 AM  Cameron [name]  has presented today for surgery, with the diagnosis of SEVERE AS ASCENDING AORTIC ANEURYSM  The various methods of treatment have been discussed with the patient and family. After consideration of risks, benefits and other options for treatment, the patient has consented to  Procedure(s) with comments: BENTALL PROCEDURE (N/A) - NEEDS BILATERAL RADIAL ARTERIAL LINES REPLACEMENT ASCENDING AORTA (N/A) TRANSESOPHAGEAL ECHOCARDIOGRAM (TEE) (N/A) as a surgical intervention .  The patient's history has been reviewed, patient examined, no change in status, stable for surgery.  I have reviewed the patient's chart and labs.  Questions were answered to the patient's satisfaction.     Alleen BorneBryan K Welcome Fults

## 2018-12-22 NOTE — Progress Notes (Signed)
RT NOTE:  Rapid Cardiac Wean initiated.

## 2018-12-22 NOTE — Anesthesia Procedure Notes (Signed)
Arterial Line Insertion Start/End12/30/2019 6:40 AM, 12/22/2018 6:45 AM Performed by: Burt Ekurner, Samantha Ashley, CRNA, CRNA  Patient location: Pre-op. Preanesthetic checklist: patient identified, IV checked, site marked, risks and benefits discussed, surgical consent, monitors and equipment checked, pre-op evaluation, timeout performed and anesthesia consent Lidocaine 1% used for infiltration and patient sedated Right, radial was placed Catheter size: 20 G Hand hygiene performed , maximum sterile barriers used  and Seldinger technique used Allen's test indicative of satisfactory collateral circulation Attempts: 1 Procedure performed without using ultrasound guided technique. Following insertion, dressing applied and Biopatch. Post procedure assessment: normal and unchanged  Patient tolerated the procedure well with no immediate complications.

## 2018-12-23 ENCOUNTER — Inpatient Hospital Stay (HOSPITAL_COMMUNITY): Payer: Commercial Managed Care - PPO

## 2018-12-23 ENCOUNTER — Encounter (HOSPITAL_COMMUNITY): Payer: Self-pay | Admitting: Surgery

## 2018-12-23 LAB — CBC
HCT: 31.2 % — ABNORMAL LOW (ref 39.0–52.0)
HCT: 32.5 % — ABNORMAL LOW (ref 39.0–52.0)
Hemoglobin: 10.4 g/dL — ABNORMAL LOW (ref 13.0–17.0)
Hemoglobin: 10.6 g/dL — ABNORMAL LOW (ref 13.0–17.0)
MCH: 29.9 pg (ref 26.0–34.0)
MCH: 29.7 pg (ref 26.0–34.0)
MCHC: 33.3 g/dL (ref 30.0–36.0)
MCHC: 32.6 g/dL (ref 30.0–36.0)
MCV: 89.7 fL (ref 80.0–100.0)
MCV: 91 fL (ref 80.0–100.0)
Platelets: 129 10*3/uL — ABNORMAL LOW (ref 150–400)
Platelets: 120 10*3/uL — ABNORMAL LOW (ref 150–400)
RBC: 3.48 MIL/uL — ABNORMAL LOW (ref 4.22–5.81)
RBC: 3.57 MIL/uL — ABNORMAL LOW (ref 4.22–5.81)
RDW: 13.2 % (ref 11.5–15.5)
RDW: 13.4 % (ref 11.5–15.5)
WBC: 18.5 10*3/uL — ABNORMAL HIGH (ref 4.0–10.5)
WBC: 22.6 10*3/uL — ABNORMAL HIGH (ref 4.0–10.5)
nRBC: 0 % (ref 0.0–0.2)
nRBC: 0 % (ref 0.0–0.2)

## 2018-12-23 LAB — PREPARE CRYOPRECIPITATE
Unit division: 0
Unit division: 0

## 2018-12-23 LAB — GLUCOSE, CAPILLARY
GLUCOSE-CAPILLARY: 143 mg/dL — AB (ref 70–99)
Glucose-Capillary: 110 mg/dL — ABNORMAL HIGH (ref 70–99)
Glucose-Capillary: 117 mg/dL — ABNORMAL HIGH (ref 70–99)
Glucose-Capillary: 115 mg/dL — ABNORMAL HIGH (ref 70–99)
Glucose-Capillary: 122 mg/dL — ABNORMAL HIGH (ref 70–99)
Glucose-Capillary: 122 mg/dL — ABNORMAL HIGH (ref 70–99)
Glucose-Capillary: 125 mg/dL — ABNORMAL HIGH (ref 70–99)
Glucose-Capillary: 123 mg/dL — ABNORMAL HIGH (ref 70–99)
Glucose-Capillary: 115 mg/dL — ABNORMAL HIGH (ref 70–99)
Glucose-Capillary: 103 mg/dL — ABNORMAL HIGH (ref 70–99)
Glucose-Capillary: 113 mg/dL — ABNORMAL HIGH (ref 70–99)
Glucose-Capillary: 112 mg/dL — ABNORMAL HIGH (ref 70–99)
Glucose-Capillary: 110 mg/dL — ABNORMAL HIGH (ref 70–99)
Glucose-Capillary: 111 mg/dL — ABNORMAL HIGH (ref 70–99)
Glucose-Capillary: 113 mg/dL — ABNORMAL HIGH (ref 70–99)
Glucose-Capillary: 112 mg/dL — ABNORMAL HIGH (ref 70–99)

## 2018-12-23 LAB — POCT I-STAT, CHEM 8
BUN: 16 mg/dL (ref 6–20)
CALCIUM ION: 1.07 mmol/L — AB (ref 1.15–1.40)
Chloride: 100 mmol/L (ref 98–111)
Creatinine, Ser: 0.9 mg/dL (ref 0.61–1.24)
Glucose, Bld: 131 mg/dL — ABNORMAL HIGH (ref 70–99)
HCT: 31 % — ABNORMAL LOW (ref 39.0–52.0)
Hemoglobin: 10.5 g/dL — ABNORMAL LOW (ref 13.0–17.0)
Potassium: 4.2 mmol/L (ref 3.5–5.1)
Sodium: 137 mmol/L (ref 135–145)
TCO2: 25 mmol/L (ref 22–32)

## 2018-12-23 LAB — BPAM CRYOPRECIPITATE
BLOOD PRODUCT EXPIRATION DATE: 201912301912
Blood Product Expiration Date: 201912301912
ISSUE DATE / TIME: 201912301345
ISSUE DATE / TIME: 201912301345
Unit Type and Rh: 6200
Unit Type and Rh: 6200

## 2018-12-23 LAB — BASIC METABOLIC PANEL
Anion gap: 7 (ref 5–15)
BUN: 12 mg/dL (ref 6–20)
CO2: 23 mmol/L (ref 22–32)
Calcium: 7.1 mg/dL — ABNORMAL LOW (ref 8.9–10.3)
Chloride: 108 mmol/L (ref 98–111)
Creatinine, Ser: 0.98 mg/dL (ref 0.61–1.24)
GFR calc Af Amer: >60 mL/min (ref >60)
GFR calc non Af Amer: >60 mL/min (ref >60)
GLUCOSE: 124 mg/dL — AB (ref 70–99)
Potassium: 4.7 mmol/L (ref 3.5–5.1)
Sodium: 138 mmol/L (ref 135–145)

## 2018-12-23 LAB — CREATININE, SERUM
CREATININE: 1 mg/dL (ref 0.61–1.24)
GFR calc Af Amer: >60 mL/min (ref >60)
GFR calc non Af Amer: >60 mL/min (ref >60)

## 2018-12-23 LAB — MAGNESIUM
MAGNESIUM: 2.3 mg/dL (ref 1.7–2.4)
Magnesium: 2.5 mg/dL — ABNORMAL HIGH (ref 1.7–2.4)

## 2018-12-23 MED ORDER — ENOXAPARIN SODIUM 40 MG/0.4ML ~~LOC~~ SOLN
40.0000 mg | Freq: Every day | SUBCUTANEOUS | Status: DC
  Administered 2018-12-23 – 2018-12-26 (×4): 40 mg via SUBCUTANEOUS
  Filled 2018-12-23 (×4): qty 0.4

## 2018-12-23 MED ORDER — FUROSEMIDE 10 MG/ML IJ SOLN
40.0000 mg | Freq: Two times a day (BID) | INTRAMUSCULAR | Status: AC
  Administered 2018-12-23 (×2): 40 mg via INTRAVENOUS
  Filled 2018-12-23 (×2): qty 4

## 2018-12-23 MED ORDER — INSULIN ASPART 100 UNIT/ML ~~LOC~~ SOLN
0.0000 [IU] | SUBCUTANEOUS | Status: DC
  Administered 2018-12-23: 2 [IU] via SUBCUTANEOUS

## 2018-12-23 MED ORDER — INSULIN DETEMIR 100 UNIT/ML ~~LOC~~ SOLN
15.0000 [IU] | Freq: Once | SUBCUTANEOUS | Status: AC
  Administered 2018-12-23: 15 [IU] via SUBCUTANEOUS
  Filled 2018-12-23: qty 0.15

## 2018-12-23 MED ORDER — METOPROLOL TARTRATE 12.5 MG HALF TABLET
12.5000 mg | ORAL_TABLET | Freq: Two times a day (BID) | ORAL | Status: DC
  Administered 2018-12-23 – 2018-12-27 (×8): 12.5 mg via ORAL
  Filled 2018-12-23 (×8): qty 1

## 2018-12-23 MED FILL — Magnesium Sulfate Inj 50%: INTRAMUSCULAR | Qty: 10 | Status: AC

## 2018-12-23 MED FILL — Heparin Sodium (Porcine) Inj 1000 Unit/ML: INTRAMUSCULAR | Qty: 30 | Status: AC

## 2018-12-23 MED FILL — Heparin Sodium (Porcine) Inj 1000 Unit/ML: INTRAMUSCULAR | Qty: 2500 | Status: AC

## 2018-12-23 MED FILL — Potassium Chloride Inj 2 mEq/ML: INTRAVENOUS | Qty: 40 | Status: AC

## 2018-12-23 NOTE — Progress Notes (Signed)
1 Day Post-Op Procedure(s) (LRB): BENTALL PROCEDURE (N/A) REPLACEMENT ASCENDING AORTA (N/A) TRANSESOPHAGEAL ECHOCARDIOGRAM (TEE) (N/A) Subjective: No complaints  Objective: Vital signs in last 24 hours: Temp:  [97 F (36.1 C)-100.6 F (38.1 C)] 100 F (37.8 C) (12/31 0700) Pulse Rate:  [78-99] 80 (12/31 0700) Cardiac Rhythm: A-V Sequential paced (12/31 0000) Resp:  [12-25] 18 (12/31 0700) BP: (82-101)/(49-72) 98/64 (12/31 0700) SpO2:  [91 %-100 %] 94 % (12/31 0700) Arterial Line BP: (85-125)/(43-95) 100/64 (12/31 0700) FiO2 (%):  [40 %-50 %] 40 % (12/30 2025) Weight:  [84.6 kg] 84.6 kg (12/31 0500)  Hemodynamic parameters for last 24 hours: PAP: (20-34)/(8-21) 22/10 CO:  [3.3 L/min-4.7 L/min] 4.7 L/min CI:  [1.7 L/min/m2-2.5 L/min/m2] 2.5 L/min/m2  Intake/Output from previous day: 12/30 0701 - 12/31 0700 In: 5193.4 [I.V.:3349.1; Blood:507; NG/GT:30; IV Piggyback:1307.2] Out: 3150 [Urine:2240; Blood:600; Chest Tube:310] Intake/Output this shift: No intake/output data recorded.  General appearance: alert and cooperative Neurologic: intact Heart: regular rate and rhythm, S1, S2 normal, no murmur, click, rub or gallop Lungs: clear to auscultation bilaterally Extremities: edema mild Wound: dressing dry  Lab Results: Recent Labs    12/22/18 2021 12/22/18 2040 12/23/18 0302  WBC 14.6*  --  18.5*  HGB 11.9* 10.9* 10.4*  HCT 34.6* 32.0* 31.2*  PLT 124*  --  129*   BMET:  Recent Labs    12/22/18 2040 12/23/18 0302  NA 138 138  K 5.4* 4.7  CL 106 108  CO2  --  23  GLUCOSE 113* 124*  BUN 14 12  CREATININE 0.90 0.98  CALCIUM  --  7.1*    PT/INR:  Recent Labs    12/22/18 1443  LABPROT 17.1*  INR 1.41   ABG    Component Value Date/Time   PHART 7.333 (L) 12/22/2018 2201   HCO3 24.9 12/22/2018 2201   TCO2 26 12/22/2018 2201   ACIDBASEDEF 1.0 12/22/2018 2201   O2SAT 97.0 12/22/2018 2201   CBG (last 3)  Recent Labs    12/23/18 0404 12/23/18 0517  12/23/18 0616  GLUCAP 122* 125* 123*   CXR: clear  ECG: sinus 67, no acute changes  Assessment/Plan: S/P Procedure(s) (LRB): BENTALL PROCEDURE (N/A) REPLACEMENT ASCENDING AORTA (N/A) TRANSESOPHAGEAL ECHOCARDIOGRAM (TEE) (N/A)  POD 1 Hemodynamically stable in sinus rhythm 70's. Will put pacer on VVI 50 for now and observe. Hold beta blocker for now until sure rate and rhythm stable.  Mobilize Glucose under control on low dose insulin drip. Preop Hgb A1c was 5.4. He received steroids in OR for circ arrest. Will give a dose of Levemir today and start SSI. Probably be able to discontinue tomorrow. Diuresis d/c tubes/lines Continue foley due to diuresing patient and patient in ICU See progression orders   LOS: 1 day    Alleen BorneBryan K Bartle 12/23/2018

## 2018-12-23 NOTE — Plan of Care (Signed)
  Problem: Education: Goal: Will demonstrate proper wound care and an understanding of methods to prevent future damage 12/23/2018 0239 by Derek Moundennis, Mattew Chriswell A, RN Outcome: Progressing Note:  Education on going 12/23/2018 0225 by Derek Moundennis, Zelie Asbill A, RN Outcome: Progressing Goal: Knowledge of disease or condition will improve 12/23/2018 0239 by Derek Moundennis, Acire Tang A, RN Outcome: Progressing 12/23/2018 0225 by Derek Moundennis, Javiana Anwar A, RN Outcome: Progressing Goal: Knowledge of the prescribed therapeutic regimen will improve 12/23/2018 0239 by Derek Moundennis, Sheritha Louis A, RN Outcome: Progressing 12/23/2018 0225 by Derek Moundennis, Traci Gafford A, RN Outcome: Progressing Goal: Individualized Educational Video(s) 12/23/2018 0239 by Derek Moundennis, Lian Pounds A, RN Outcome: Progressing 12/23/2018 0225 by Derek Moundennis, Ahyan Kreeger A, RN Outcome: Progressing   Problem: Activity: Goal: Risk for activity intolerance will decrease 12/23/2018 0239 by Derek Moundennis, Charlot Gouin A, RN Outcome: Progressing Note:  Up OOB in AM 12/23/2018 0225 by Derek Moundennis, Alayna Mabe A, RN Outcome: Progressing Note:  OOB in AM   Problem: Cardiac: Goal: Will achieve and/or maintain hemodynamic stability 12/23/2018 0239 by Derek Moundennis, Seydina Holliman A, RN Outcome: Progressing Note:  Within parameters on current gtt's 12/23/2018 0225 by Derek Moundennis, Enriqueta Augusta A, RN Outcome: Progressing Note:  Within parameters on current gtt's   Problem: Clinical Measurements: Goal: Postoperative complications will be avoided or minimized 12/23/2018 0239 by Derek Moundennis, Sandro Burgo A, RN Outcome: Progressing 12/23/2018 0225 by Derek Moundennis, Kennidee Heyne A, RN Outcome: Progressing   Problem: Respiratory: Goal: Respiratory status will improve 12/23/2018 0239 by Derek Moundennis, Dexter Sauser A, RN Outcome: Progressing 12/23/2018 0225 by Derek Moundennis, Danie Diehl A, RN Outcome: Progressing   Problem: Skin Integrity: Goal: Wound healing without signs and symptoms of infection 12/23/2018 0239 by Derek Moundennis, Jakyri Brunkhorst A, RN Outcome: Progressing 12/23/2018 0225 by Derek Moundennis, Winn Muehl A,  RN Outcome: Progressing Goal: Risk for impaired skin integrity will decrease 12/23/2018 0239 by Derek Moundennis, Amiri Riechers A, RN Outcome: Progressing 12/23/2018 0225 by Derek Moundennis, Johndaniel Catlin A, RN Outcome: Progressing   Problem: Urinary Elimination: Goal: Ability to achieve and maintain adequate renal perfusion and functioning will improve 12/23/2018 0239 by Derek Moundennis, Anzal Bartnick A, RN Outcome: Progressing 12/23/2018 0225 by Derek Moundennis, Mikenzie Mccannon A, RN Outcome: Progressing Note:  UOP within parameters

## 2018-12-23 NOTE — Progress Notes (Signed)
Patient ID: Cameron SpenceCharles R [name], male   DOB: [birth year], 62 y.o.   MRN: [id number] TCTS Evening Rounds:  Hemodynamically stable in sinus rhythm. Diuresing well  Ambulated.  BMET    Component Value Date/Time   NA 137 12/23/2018 1709   NA 144 12/04/2018 1259   K 4.2 12/23/2018 1709   CL 100 12/23/2018 1709   CO2 23 12/23/2018 0302   GLUCOSE 131 (H) 12/23/2018 1709   BUN 16 12/23/2018 1709   BUN 15 12/04/2018 1259   CREATININE 0.90 12/23/2018 1709   CALCIUM 7.1 (L) 12/23/2018 0302   GFRNONAA >60 12/23/2018 1643   GFRAA >60 12/23/2018 1643   CBC    Component Value Date/Time   WBC 22.6 (H) 12/23/2018 1643   RBC 3.57 (L) 12/23/2018 1643   HGB 10.5 (L) 12/23/2018 1709   HGB 15.5 12/04/2018 1259   HCT 31.0 (L) 12/23/2018 1709   HCT 45.1 12/04/2018 1259   PLT 120 (L) 12/23/2018 1643   PLT 209 12/04/2018 1259   MCV 91.0 12/23/2018 1643   MCV 88 12/04/2018 1259   MCH 29.7 12/23/2018 1643   MCHC 32.6 12/23/2018 1643   RDW 13.4 12/23/2018 1643   RDW 13.2 12/04/2018 1259

## 2018-12-23 NOTE — Anesthesia Postprocedure Evaluation (Signed)
Anesthesia Post Note  Patient: Delila SpenceCharles R Lesniak  Procedure(s) Performed: BENTALL PROCEDURE (N/A Chest) REPLACEMENT ASCENDING AORTA (N/A ) TRANSESOPHAGEAL ECHOCARDIOGRAM (TEE) (N/A )     Patient location during evaluation: ICU Anesthesia Type: General Level of consciousness: awake and alert Pain management: pain level controlled Vital Signs Assessment: post-procedure vital signs reviewed and stable Respiratory status: spontaneous breathing, nonlabored ventilation, respiratory function stable and patient connected to nasal cannula oxygen Cardiovascular status: blood pressure returned to baseline and stable Postop Assessment: no apparent nausea or vomiting Anesthetic complications: no Comments: Extubated POD#0, doing well.    Last Vitals:  Vitals:   12/23/18 0700 12/23/18 0800  BP: 98/64 94/61  Pulse: 80 75  Resp: 18 (!) 23  Temp: 37.8 C 37.9 C  SpO2: 94% 94%    Last Pain:  Vitals:   12/23/18 0800  TempSrc: Core  PainSc:                  Beryle Lathehomas E Brock

## 2018-12-24 ENCOUNTER — Inpatient Hospital Stay (HOSPITAL_COMMUNITY): Payer: Commercial Managed Care - PPO

## 2018-12-24 LAB — CBC
HEMATOCRIT: 31.2 % — AB (ref 39.0–52.0)
Hemoglobin: 10.2 g/dL — ABNORMAL LOW (ref 13.0–17.0)
MCH: 30.6 pg (ref 26.0–34.0)
MCHC: 32.7 g/dL (ref 30.0–36.0)
MCV: 93.7 fL (ref 80.0–100.0)
Platelets: 102 10*3/uL — ABNORMAL LOW (ref 150–400)
RBC: 3.33 MIL/uL — ABNORMAL LOW (ref 4.22–5.81)
RDW: 13.3 % (ref 11.5–15.5)
WBC: 20.2 10*3/uL — AB (ref 4.0–10.5)
nRBC: 0 % (ref 0.0–0.2)

## 2018-12-24 LAB — BASIC METABOLIC PANEL
Anion gap: 7 (ref 5–15)
BUN: 15 mg/dL (ref 6–20)
CO2: 27 mmol/L (ref 22–32)
Calcium: 7.7 mg/dL — ABNORMAL LOW (ref 8.9–10.3)
Chloride: 102 mmol/L (ref 98–111)
Creatinine, Ser: 1.01 mg/dL (ref 0.61–1.24)
GFR calc Af Amer: >60 mL/min (ref >60)
GFR calc non Af Amer: >60 mL/min (ref >60)
Glucose, Bld: 135 mg/dL — ABNORMAL HIGH (ref 70–99)
Potassium: 4.3 mmol/L (ref 3.5–5.1)
Sodium: 136 mmol/L (ref 135–145)

## 2018-12-24 MED ORDER — FUROSEMIDE 40 MG PO TABS
40.0000 mg | ORAL_TABLET | Freq: Once | ORAL | Status: AC
  Administered 2018-12-24: 40 mg via ORAL
  Filled 2018-12-24: qty 1

## 2018-12-24 MED ORDER — ORAL CARE MOUTH RINSE
15.0000 mL | Freq: Two times a day (BID) | OROMUCOSAL | Status: DC
  Administered 2018-12-24 – 2018-12-26 (×3): 15 mL via OROMUCOSAL

## 2018-12-24 MED ORDER — POTASSIUM CHLORIDE CRYS ER 20 MEQ PO TBCR
20.0000 meq | EXTENDED_RELEASE_TABLET | Freq: Once | ORAL | Status: AC
  Administered 2018-12-24: 20 meq via ORAL
  Filled 2018-12-24: qty 1

## 2018-12-24 MED ORDER — GUAIFENESIN ER 600 MG PO TB12
1200.0000 mg | ORAL_TABLET | Freq: Two times a day (BID) | ORAL | Status: AC
  Administered 2018-12-24 – 2018-12-26 (×6): 1200 mg via ORAL
  Filled 2018-12-24 (×6): qty 2

## 2018-12-24 MED ORDER — METHIMAZOLE 5 MG PO TABS
2.5000 mg | ORAL_TABLET | ORAL | Status: DC
  Filled 2018-12-24: qty 1

## 2018-12-24 NOTE — Progress Notes (Signed)
RT NOTE:  RT unavailable before patient went to sleep. Pt did not ask for CPAP. Pt sleeping when RT returned from Cath Lab trip. Will put CPAP on if patient request. Pt maintaining without CPAP.

## 2018-12-24 NOTE — Progress Notes (Signed)
2 Days Post-Op Procedure(s) (LRB): BENTALL PROCEDURE (N/A) REPLACEMENT ASCENDING AORTA (N/A) TRANSESOPHAGEAL ECHOCARDIOGRAM (TEE) (N/A) Subjective: No complaints  Objective: Vital signs in last 24 hours: Temp:  [98.9 F (37.2 C)-101.1 F (38.4 C)] 98.9 F (37.2 C) (01/01 0750) Pulse Rate:  [68-91] 74 (01/01 0900) Cardiac Rhythm: Normal sinus rhythm (01/01 0800) Resp:  [16-26] 17 (01/01 0900) BP: (92-119)/(55-84) 116/71 (01/01 0900) SpO2:  [92 %-98 %] 95 % (01/01 0900) Arterial Line BP: (105)/(61) 105/61 (12/31 1000) Weight:  [80.7 kg] 80.7 kg (01/01 0500)  Hemodynamic parameters for last 24 hours: PAP: (26)/(12) 26/12  Intake/Output from previous day: 12/31 0701 - 01/01 0700 In: 977.7 [I.V.:777.5; IV Piggyback:200.1] Out: 2315 [Urine:2305; Chest Tube:10] Intake/Output this shift: Total I/O In: -  Out: 40 [Urine:40]  General appearance: alert and cooperative Neurologic: intact Heart: regular rate and rhythm, S1, S2 normal, no murmur, click, rub or gallop Lungs: clear to auscultation bilaterally Extremities: extremities normal, atraumatic, no cyanosis or edema Wound: incision ok  Lab Results: Recent Labs    12/23/18 1643 12/23/18 1709 12/24/18 0449  WBC 22.6*  --  20.2*  HGB 10.6* 10.5* 10.2*  HCT 32.5* 31.0* 31.2*  PLT 120*  --  102*   BMET:  Recent Labs    12/23/18 0302  12/23/18 1709 12/24/18 0449  NA 138  --  137 136  K 4.7  --  4.2 4.3  CL 108  --  100 102  CO2 23  --   --  27  GLUCOSE 124*  --  131* 135*  BUN 12  --  16 15  CREATININE 0.98   < > 0.90 1.01  CALCIUM 7.1*  --   --  7.7*   < > = values in this interval not displayed.    PT/INR:  Recent Labs    12/22/18 1443  LABPROT 17.1*  INR 1.41   ABG    Component Value Date/Time   PHART 7.333 (L) 12/22/2018 2201   HCO3 24.9 12/22/2018 2201   TCO2 25 12/23/2018 1709   ACIDBASEDEF 1.0 12/22/2018 2201   O2SAT 97.0 12/22/2018 2201   CBG (last 3)  Recent Labs    12/23/18 1359  12/23/18 1641 12/23/18 2005  GLUCAP 113* 143* 112*    Assessment/Plan: S/P Procedure(s) (LRB): BENTALL PROCEDURE (N/A) REPLACEMENT ASCENDING AORTA (N/A) TRANSESOPHAGEAL ECHOCARDIOGRAM (TEE) (N/A)  POD 2 Hemodynamically stable in sinus rhythm. Continue Lopressor. Wt is back to baseline but has mild edema with puffy hands. Will give some oral lasix today. DC foley and sleeve. Continue IS, ambulation.    LOS: 2 days    Alleen Borne 12/24/2018

## 2018-12-24 NOTE — Plan of Care (Signed)
  Problem: Education: Goal: Will demonstrate proper wound care and an understanding of methods to prevent future damage Outcome: Progressing Goal: Knowledge of disease or condition will improve Outcome: Progressing Goal: Knowledge of the prescribed therapeutic regimen will improve Outcome: Progressing Goal: Individualized Educational Video(s) Outcome: Progressing   Problem: Activity: Goal: Risk for activity intolerance will decrease Outcome: Progressing Note:  Up OOBx3   Problem: Cardiac: Goal: Will achieve and/or maintain hemodynamic stability Outcome: Progressing Note:  Within parameters   Problem: Clinical Measurements: Goal: Postoperative complications will be avoided or minimized Outcome: Progressing   Problem: Respiratory: Goal: Respiratory status will improve Outcome: Progressing   Problem: Skin Integrity: Goal: Wound healing without signs and symptoms of infection Outcome: Progressing Goal: Risk for impaired skin integrity will decrease Outcome: Progressing   Problem: Urinary Elimination: Goal: Ability to achieve and maintain adequate renal perfusion and functioning will improve Outcome: Progressing

## 2018-12-24 NOTE — Progress Notes (Signed)
Patient ID: Cameron [name], male   DOB: 01-28-1956, 63 y.o.   MRN: [id number] TCTS Evening Rounds:  Hemodynamically stable in sinus rhythm. Passing some flatus Urinating since foley removed Ambulated.

## 2018-12-25 ENCOUNTER — Other Ambulatory Visit: Payer: Self-pay

## 2018-12-25 LAB — POCT I-STAT 3, ART BLOOD GAS (G3+)
Acid-Base Excess: 1 mmol/L (ref 0.0–2.0)
Acid-Base Excess: 4 mmol/L — ABNORMAL HIGH (ref 0.0–2.0)
Acid-Base Excess: 5 mmol/L — ABNORMAL HIGH (ref 0.0–2.0)
Acid-Base Excess: 3 mmol/L — ABNORMAL HIGH (ref 0.0–2.0)
Bicarbonate: 28.4 mmol/L — ABNORMAL HIGH (ref 20.0–28.0)
Bicarbonate: 28.3 mmol/L — ABNORMAL HIGH (ref 20.0–28.0)
Bicarbonate: 31 mmol/L — ABNORMAL HIGH (ref 20.0–28.0)
Bicarbonate: 26.6 mmol/L (ref 20.0–28.0)
O2 SAT: 100 %
O2 Saturation: 100 %
O2 Saturation: 100 %
O2 Saturation: 100 %
PCO2 ART: 54.1 mmHg — AB (ref 32.0–48.0)
PH ART: 7.489 — AB (ref 7.350–7.450)
PO2 ART: 482 mmHg — AB (ref 83.0–108.0)
TCO2: 30 mmol/L (ref 22–32)
TCO2: 30 mmol/L (ref 22–32)
TCO2: 33 mmol/L — ABNORMAL HIGH (ref 22–32)
TCO2: 28 mmol/L (ref 22–32)
pCO2 arterial: 41.4 mmHg (ref 32.0–48.0)
pCO2 arterial: 52.7 mmHg — ABNORMAL HIGH (ref 32.0–48.0)
pCO2 arterial: 35 mmHg (ref 32.0–48.0)
pH, Arterial: 7.328 — ABNORMAL LOW (ref 7.350–7.450)
pH, Arterial: 7.442 (ref 7.350–7.450)
pH, Arterial: 7.377 (ref 7.350–7.450)
pO2, Arterial: 359 mmHg — ABNORMAL HIGH (ref 83.0–108.0)
pO2, Arterial: 462 mmHg — ABNORMAL HIGH (ref 83.0–108.0)
pO2, Arterial: 320 mmHg — ABNORMAL HIGH (ref 83.0–108.0)

## 2018-12-25 LAB — POCT I-STAT, CHEM 8
BUN: 13 mg/dL (ref 6–20)
BUN: 12 mg/dL (ref 6–20)
BUN: 13 mg/dL (ref 6–20)
BUN: 12 mg/dL (ref 6–20)
BUN: 10 mg/dL (ref 6–20)
BUN: 12 mg/dL (ref 6–20)
BUN: 12 mg/dL (ref 6–20)
CREATININE: 0.6 mg/dL — AB (ref 0.61–1.24)
Calcium, Ion: 1.21 mmol/L (ref 1.15–1.40)
Calcium, Ion: 0.96 mmol/L — ABNORMAL LOW (ref 1.15–1.40)
Calcium, Ion: 1.14 mmol/L — ABNORMAL LOW (ref 1.15–1.40)
Calcium, Ion: 0.96 mmol/L — ABNORMAL LOW (ref 1.15–1.40)
Calcium, Ion: 0.93 mmol/L — ABNORMAL LOW (ref 1.15–1.40)
Calcium, Ion: 0.99 mmol/L — ABNORMAL LOW (ref 1.15–1.40)
Calcium, Ion: 0.98 mmol/L — ABNORMAL LOW (ref 1.15–1.40)
Chloride: 102 mmol/L (ref 98–111)
Chloride: 100 mmol/L (ref 98–111)
Chloride: 105 mmol/L (ref 98–111)
Chloride: 96 mmol/L — ABNORMAL LOW (ref 98–111)
Chloride: 102 mmol/L (ref 98–111)
Chloride: 103 mmol/L (ref 98–111)
Chloride: 100 mmol/L (ref 98–111)
Creatinine, Ser: 0.7 mg/dL (ref 0.61–1.24)
Creatinine, Ser: 0.7 mg/dL (ref 0.61–1.24)
Creatinine, Ser: 0.7 mg/dL (ref 0.61–1.24)
Creatinine, Ser: 0.8 mg/dL (ref 0.61–1.24)
Creatinine, Ser: 0.6 mg/dL — ABNORMAL LOW (ref 0.61–1.24)
Creatinine, Ser: 0.7 mg/dL (ref 0.61–1.24)
GLUCOSE: 122 mg/dL — AB (ref 70–99)
Glucose, Bld: 102 mg/dL — ABNORMAL HIGH (ref 70–99)
Glucose, Bld: 106 mg/dL — ABNORMAL HIGH (ref 70–99)
Glucose, Bld: 96 mg/dL (ref 70–99)
Glucose, Bld: 149 mg/dL — ABNORMAL HIGH (ref 70–99)
Glucose, Bld: 145 mg/dL — ABNORMAL HIGH (ref 70–99)
Glucose, Bld: 116 mg/dL — ABNORMAL HIGH (ref 70–99)
HCT: 39 % (ref 39.0–52.0)
HCT: 25 % — ABNORMAL LOW (ref 39.0–52.0)
HCT: 28 % — ABNORMAL LOW (ref 39.0–52.0)
HCT: 31 % — ABNORMAL LOW (ref 39.0–52.0)
HCT: 27 % — ABNORMAL LOW (ref 39.0–52.0)
HEMATOCRIT: 29 % — AB (ref 39.0–52.0)
HEMATOCRIT: 39 % (ref 39.0–52.0)
Hemoglobin: 13.3 g/dL (ref 13.0–17.0)
Hemoglobin: 13.3 g/dL (ref 13.0–17.0)
Hemoglobin: 9.9 g/dL — ABNORMAL LOW (ref 13.0–17.0)
Hemoglobin: 8.5 g/dL — ABNORMAL LOW (ref 13.0–17.0)
Hemoglobin: 10.5 g/dL — ABNORMAL LOW (ref 13.0–17.0)
Hemoglobin: 9.5 g/dL — ABNORMAL LOW (ref 13.0–17.0)
Hemoglobin: 9.2 g/dL — ABNORMAL LOW (ref 13.0–17.0)
Potassium: 4 mmol/L (ref 3.5–5.1)
Potassium: 3.9 mmol/L (ref 3.5–5.1)
Potassium: 4.2 mmol/L (ref 3.5–5.1)
Potassium: 4.5 mmol/L (ref 3.5–5.1)
Potassium: 4.3 mmol/L (ref 3.5–5.1)
Potassium: 4.6 mmol/L (ref 3.5–5.1)
Potassium: 3.4 mmol/L — ABNORMAL LOW (ref 3.5–5.1)
Sodium: 141 mmol/L (ref 135–145)
Sodium: 137 mmol/L (ref 135–145)
Sodium: 138 mmol/L (ref 135–145)
Sodium: 135 mmol/L (ref 135–145)
Sodium: 135 mmol/L (ref 135–145)
Sodium: 137 mmol/L (ref 135–145)
Sodium: 139 mmol/L (ref 135–145)
TCO2: 30 mmol/L (ref 22–32)
TCO2: 30 mmol/L (ref 22–32)
TCO2: 31 mmol/L (ref 22–32)
TCO2: 31 mmol/L (ref 22–32)
TCO2: 29 mmol/L (ref 22–32)
TCO2: 31 mmol/L (ref 22–32)
TCO2: 28 mmol/L (ref 22–32)

## 2018-12-25 LAB — GLUCOSE, CAPILLARY: Glucose-Capillary: 37 mg/dL — CL (ref 70–99)

## 2018-12-25 MED ORDER — KETOROLAC TROMETHAMINE 15 MG/ML IJ SOLN
15.0000 mg | Freq: Four times a day (QID) | INTRAMUSCULAR | Status: DC | PRN
  Administered 2018-12-25 – 2018-12-26 (×4): 15 mg via INTRAVENOUS
  Filled 2018-12-25 (×4): qty 1

## 2018-12-25 NOTE — Progress Notes (Signed)
3 Days Post-Op Procedure(s) (LRB): BENTALL PROCEDURE (N/A) REPLACEMENT ASCENDING AORTA (N/A) TRANSESOPHAGEAL ECHOCARDIOGRAM (TEE) (N/A) Subjective: Only complaint is of post-surgical pain. Did not want any pain medicine overnight but this am very sore. Ambulated this am.  Objective: Vital signs in last 24 hours: Temp:  [98 F (36.7 C)-99.2 F (37.3 C)] 98.5 F (36.9 C) (01/02 0400) Pulse Rate:  [74-92] 79 (01/02 0500) Cardiac Rhythm: Normal sinus rhythm (01/02 0400) Resp:  [15-31] 31 (01/02 0600) BP: (93-123)/(64-91) 116/75 (01/02 0600) SpO2:  [87 %-100 %] 93 % (01/02 0600) Weight:  [79.8 kg] 79.8 kg (01/02 0500)  Hemodynamic parameters for last 24 hours:    Intake/Output from previous day: 01/01 0701 - 01/02 0700 In: 480 [P.O.:480] Out: 890 [Urine:890] Intake/Output this shift: No intake/output data recorded.  General appearance: alert and cooperative Neurologic: intact Heart: regular rate and rhythm, S1, S2 normal, no murmur, click, rub or gallop Lungs: clear to auscultation bilaterally Extremities: extremities normal, atraumatic, no cyanosis or edema Wound: incision ok  Lab Results: Recent Labs    12/23/18 1643 12/23/18 1709 12/24/18 0449  WBC 22.6*  --  20.2*  HGB 10.6* 10.5* 10.2*  HCT 32.5* 31.0* 31.2*  PLT 120*  --  102*   BMET:  Recent Labs    12/23/18 0302  12/23/18 1709 12/24/18 0449  NA 138  --  137 136  K 4.7  --  4.2 4.3  CL 108  --  100 102  CO2 23  --   --  27  GLUCOSE 124*  --  131* 135*  BUN 12  --  16 15  CREATININE 0.98   < > 0.90 1.01  CALCIUM 7.1*  --   --  7.7*   < > = values in this interval not displayed.    PT/INR:  Recent Labs    12/22/18 1443  LABPROT 17.1*  INR 1.41   ABG    Component Value Date/Time   PHART 7.333 (L) 12/22/2018 2201   HCO3 24.9 12/22/2018 2201   TCO2 25 12/23/2018 1709   ACIDBASEDEF 1.0 12/22/2018 2201   O2SAT 97.0 12/22/2018 2201   CBG (last 3)  Recent Labs    12/23/18 1359  12/23/18 1641 12/23/18 2005  GLUCAP 113* 143* 112*    Assessment/Plan: S/P Procedure(s) (LRB): BENTALL PROCEDURE (N/A) REPLACEMENT ASCENDING AORTA (N/A) TRANSESOPHAGEAL ECHOCARDIOGRAM (TEE) (N/A)  POD 3 Hemodynamically stable in sinus rhythm. Will transfer to 4E Continue IS, ambulation Home in a couple days.   LOS: 3 days    Alleen Borne 12/25/2018

## 2018-12-25 NOTE — Progress Notes (Signed)
      [address]       [place] [address]             [phone]      POD # 3 Bentall  Stable day  Awaiting bed on 4 E  Joushua Dugar C. Dorris Fetch, MD [facility] [phone]

## 2018-12-26 LAB — BPAM RBC
BLOOD PRODUCT EXPIRATION DATE: 202001112359
Blood Product Expiration Date: 202001112359
Blood Product Expiration Date: 202001172359
Blood Product Expiration Date: 202001182359
ISSUE DATE / TIME: 201912301004
ISSUE DATE / TIME: 201912301004
UNIT TYPE AND RH: 600
Unit Type and Rh: 600
Unit Type and Rh: 600
Unit Type and Rh: 600

## 2018-12-26 LAB — TYPE AND SCREEN
ABO/RH(D): A NEG
Antibody Screen: NEGATIVE
UNIT DIVISION: 0
Unit division: 0
Unit division: 0
Unit division: 0

## 2018-12-26 NOTE — Progress Notes (Signed)
RT offered to place pt on CPAP for the night. Pt states his son can place him on it after he receives his bedtime medications. RT will continue to monitor.

## 2018-12-26 NOTE — Progress Notes (Signed)
CT surgery p.m. Rounds [facility] in hallway twice Maintaining sinus rhythm On room air No narcotics required today for pain Pacing wires have been removed Probable DC home this weekend

## 2018-12-26 NOTE — Progress Notes (Signed)
4 Days Post-Op Procedure(s) (LRB): BENTALL PROCEDURE (N/A) REPLACEMENT ASCENDING AORTA (N/A) TRANSESOPHAGEAL ECHOCARDIOGRAM (TEE) (N/A) Subjective: No specific complaints but expected postop pain. Had BM yesterday Off oxygen Ambulating   Objective: Vital signs in last 24 hours: Temp:  [97.9 F (36.6 C)-99.2 F (37.3 C)] 99.2 F (37.3 C) (01/03 0400) Pulse Rate:  [73-86] 73 (01/03 0600) Cardiac Rhythm: Normal sinus rhythm (01/02 2000) Resp:  [12-23] 12 (01/03 0600) BP: (87-124)/(51-84) 124/81 (01/03 0600) SpO2:  [93 %-99 %] 99 % (01/03 0600) Weight:  [80.3 kg] 80.3 kg (01/03 0600)  Hemodynamic parameters for last 24 hours:    Intake/Output from previous day: 01/02 0701 - 01/03 0700 In: 1160 [P.O.:1160] Out: 725 [Urine:725] Intake/Output this shift: No intake/output data recorded.  General appearance: alert and cooperative Neurologic: intact Heart: regular rate and rhythm, S1, S2 normal, no murmur, click, rub or gallop Lungs: clear to auscultation bilaterally Extremities: extremities normal, atraumatic, no cyanosis or edema Wound: incision ok  Lab Results: Recent Labs    12/23/18 1643 12/23/18 1709 12/24/18 0449  WBC 22.6*  --  20.2*  HGB 10.6* 10.5* 10.2*  HCT 32.5* 31.0* 31.2*  PLT 120*  --  102*   BMET:  Recent Labs    12/23/18 1709 12/24/18 0449  NA 137 136  K 4.2 4.3  CL 100 102  CO2  --  27  GLUCOSE 131* 135*  BUN 16 15  CREATININE 0.90 1.01  CALCIUM  --  7.7*    PT/INR: No results for input(s): LABPROT, INR in the last 72 hours. ABG    Component Value Date/Time   PHART 7.333 (L) 12/22/2018 2201   HCO3 24.9 12/22/2018 2201   TCO2 25 12/23/2018 1709   ACIDBASEDEF 1.0 12/22/2018 2201   O2SAT 97.0 12/22/2018 2201   CBG (last 3)  Recent Labs    12/23/18 1359 12/23/18 1641 12/23/18 2005  GLUCAP 113* 143* 112*    Assessment/Plan: S/P Procedure(s) (LRB): BENTALL PROCEDURE (N/A) REPLACEMENT ASCENDING AORTA (N/A) TRANSESOPHAGEAL  ECHOCARDIOGRAM (TEE) (N/A)  POD 4 Hemodynamically stable in sinus rhythm on Lopressor 12.5 bid. Will plan to resume Toprol 25 at discharge.  Wt is back to baseline.  DC pacing wires  Continue ambulation and IS.  Awaiting 4E bed.   Home in the next day or two if no changes and pain under control.   LOS: 4 days    Alleen Borne 12/26/2018

## 2018-12-27 LAB — BASIC METABOLIC PANEL
Anion gap: 8 (ref 5–15)
BUN: 20 mg/dL (ref 6–20)
CO2: 23 mmol/L (ref 22–32)
Calcium: 8 mg/dL — ABNORMAL LOW (ref 8.9–10.3)
Chloride: 108 mmol/L (ref 98–111)
Creatinine, Ser: 0.86 mg/dL (ref 0.61–1.24)
GFR calc Af Amer: >60 mL/min (ref >60)
GFR calc non Af Amer: >60 mL/min (ref >60)
Glucose, Bld: 99 mg/dL (ref 70–99)
Potassium: 3.7 mmol/L (ref 3.5–5.1)
Sodium: 139 mmol/L (ref 135–145)

## 2018-12-27 LAB — CBC
HCT: 28.6 % — ABNORMAL LOW (ref 39.0–52.0)
Hemoglobin: 9.1 g/dL — ABNORMAL LOW (ref 13.0–17.0)
MCH: 29.6 pg (ref 26.0–34.0)
MCHC: 31.8 g/dL (ref 30.0–36.0)
MCV: 93.2 fL (ref 80.0–100.0)
Platelets: 190 10*3/uL (ref 150–400)
RBC: 3.07 MIL/uL — ABNORMAL LOW (ref 4.22–5.81)
RDW: 13.6 % (ref 11.5–15.5)
WBC: 8.9 10*3/uL (ref 4.0–10.5)
nRBC: 0 % (ref 0.0–0.2)

## 2018-12-27 MED ORDER — HYDROCORTISONE 1 % EX CREA
TOPICAL_CREAM | Freq: Two times a day (BID) | CUTANEOUS | Status: DC
  Administered 2018-12-27: 13:00:00 via TOPICAL
  Filled 2018-12-27: qty 28

## 2018-12-27 MED ORDER — SODIUM CHLORIDE 0.9 % IV SOLN: 250.0000 mL | INTRAVENOUS | Status: DC | PRN

## 2018-12-27 MED ORDER — HYDROCORTISONE 1 % EX CREA: TOPICAL_CREAM | Freq: Two times a day (BID) | CUTANEOUS | 0 refills | Status: DC

## 2018-12-27 MED ORDER — MOVING RIGHT ALONG BOOK
Freq: Once | Status: AC
  Administered 2018-12-27: 13:00:00
  Filled 2018-12-27: qty 1

## 2018-12-27 MED ORDER — OXYCODONE HCL 5 MG PO TABS
5.0000 mg | ORAL_TABLET | ORAL | Status: DC | PRN
  Administered 2018-12-27 (×2): 10 mg via ORAL
  Filled 2018-12-27 (×2): qty 2

## 2018-12-27 MED ORDER — BISACODYL 10 MG RE SUPP: 10.0000 mg | Freq: Every day | RECTAL | Status: DC | PRN

## 2018-12-27 MED ORDER — ASPIRIN 325 MG PO TBEC: 325.0000 mg | DELAYED_RELEASE_TABLET | Freq: Every day | ORAL | 0 refills | Status: DC

## 2018-12-27 MED ORDER — OXYCODONE HCL 5 MG PO TABS: 5.0000 mg | ORAL_TABLET | ORAL | 0 refills | Status: DC | PRN

## 2018-12-27 MED ORDER — SODIUM CHLORIDE 0.9% FLUSH: 3.0000 mL | INTRAVENOUS | Status: DC | PRN

## 2018-12-27 MED ORDER — BISACODYL 5 MG PO TBEC: 10.0000 mg | DELAYED_RELEASE_TABLET | Freq: Every day | ORAL | Status: DC | PRN

## 2018-12-27 MED ORDER — ONDANSETRON HCL 4 MG/2ML IJ SOLN: 4.0000 mg | Freq: Four times a day (QID) | INTRAMUSCULAR | Status: DC | PRN

## 2018-12-27 MED ORDER — DOCUSATE SODIUM 100 MG PO CAPS
200.0000 mg | ORAL_CAPSULE | Freq: Every day | ORAL | Status: DC
  Administered 2018-12-27: 200 mg via ORAL
  Filled 2018-12-27: qty 2

## 2018-12-27 MED ORDER — ACETAMINOPHEN 325 MG PO TABS: 650.0000 mg | ORAL_TABLET | Freq: Four times a day (QID) | ORAL | Status: DC | PRN

## 2018-12-27 MED ORDER — ASPIRIN EC 325 MG PO TBEC
325.0000 mg | DELAYED_RELEASE_TABLET | Freq: Every day | ORAL | Status: DC
  Administered 2018-12-27: 325 mg via ORAL
  Filled 2018-12-27: qty 1

## 2018-12-27 MED ORDER — ONDANSETRON HCL 4 MG PO TABS: 4.0000 mg | ORAL_TABLET | Freq: Four times a day (QID) | ORAL | Status: DC | PRN

## 2018-12-27 MED ORDER — SODIUM CHLORIDE 0.9% FLUSH: 3.0000 mL | Freq: Two times a day (BID) | INTRAVENOUS | Status: DC

## 2018-12-27 NOTE — Discharge Instructions (Signed)
Discharge Instructions:  1. You may shower, please wash incisions daily with soap and water and keep dry.  If you wish to cover wounds with dressing you may do so but please keep clean and change daily.  No tub baths or swimming until incisions have completely healed.  If your incisions become red or develop any drainage please call our office at [phone]  2. No Driving until cleared by Dr. Sharee PimpleBartle's office and you are no longer using narcotic pain medications  3. Monitor your weight daily.. Please use the same scale and weigh at same time... If you gain 3-5 lbs in 48 hours with associated lower extremity swelling, please contact our office at [phone]  4. Fever of 101.5 for at least 24 hours, please contact our office at [phone]  Aortic Valve Replacement, Care After Refer to this sheet in the next few weeks. These instructions provide you with information about caring for yourself after your procedure. Your health care provider may also give you more specific instructions. Your treatment has been planned according to current medical practices, but problems sometimes occur. Call your health care provider if you have any problems or questions after your procedure. What can I expect after the procedure? After the procedure, it is common to have:  Pain around your incision area.  A small amount of blood or clear fluid coming from your incision. Follow these instructions at home: Eating and drinking      Follow instructions from your health care provider about eating or drinking restrictions. ? Limit alcohol intake to no more than 1 drink per day for nonpregnant women and 2 drinks per day for men. One drink equals 12 oz of beer, 5 oz of wine, or 1 oz of hard liquor. ? Limit how much caffeine you drink. Caffeine can affect your heart's rate and rhythm.  Drink enough fluid to keep your urine clear or pale yellow.  Eat a heart-healthy diet. This should include plenty of fresh  fruits and vegetables. If you eat meat, it should be lean cuts. Avoid foods that are: ? High in salt, saturated fat, or sugar. ? Canned or highly processed. ? Fried. Activity  Return to your normal activities as told by your health care provider. Ask your health care provider what activities are safe for you.  Exercise regularly once you have recovered, as told by your health care provider.  Avoid sitting for more than 2 hours at a time without moving. Get up and move around at least once every 1-2 hours. This helps to prevent blood clots in the legs.  Do not lift anything that is heavier than 10 lb (4.5 kg) until your health care provider approves.  Avoid pushing or pulling things with your arms until your health care provider approves. This includes pulling on handrails to help you climb stairs. Incision care   Follow instructions from your health care provider about how to take care of your incision. Make sure you: ? Wash your hands with soap and water before you change your bandage (dressing). If soap and water are not available, use hand sanitizer. ? Change your dressing as told by your health care provider. ? Leave stitches (sutures), skin glue, or adhesive strips in place. These skin closures may need to stay in place for 2 weeks or longer. If adhesive strip edges start to loosen and curl up, you may trim the loose edges. Do not remove adhesive strips completely unless your health care provider tells you to do  that.  Check your incision area every day for signs of infection. Check for: ? More redness, swelling, or pain. ? More fluid or blood. ? Warmth. ? Pus or a bad smell. Medicines  Take over-the-counter and prescription medicines only as told by your health care provider.  If you were prescribed an antibiotic medicine, take it as told by your health care provider. Do not stop taking the antibiotic even if you start to feel better. Travel  Avoid airplane travel for as long  as told by your health care provider.  When you travel, bring a list of your medicines and a record of your medical history with you. Carry your medicines with you. Driving  Ask your health care provider when it is safe for you to drive. Do not drive until your health care provider approves.  Do not drive or operate heavy machinery while taking prescription pain medicine. Lifestyle  Do not use any tobacco products, such as cigarettes, chewing tobacco, or e-cigarettes. If you need help quitting, ask your health care provider.  Resume sexual activity as told by your health care provider. Do not use medicines for erectile dysfunction unless your health care provider approves, if this applies.  Work with your health care provider to keep your blood pressure and cholesterol under control, and to manage any other heart conditions that you have.  Maintain a healthy weight. General instructions  Do not take baths, swim, or use a hot tub until your health care provider approves.  Do not strain to have a bowel movement.  Avoid crossing your legs while sitting down.  Check your temperature every day for a fever. A fever may be a sign of infection.  If you are a woman and you plan to become pregnant, talk with your health care provider before you become pregnant.  Wear compression stockings if your health care provider instructs you to do this. These stockings help to prevent blood clots and reduce swelling in your legs.  Tell all health care providers who care for you that you have an artificial (prosthetic) aortic valve. If you have or have had heart disease or endocarditis, tell all health care providers about these conditions as well.  Keep all follow-up visits as told by your health care provider. This is important. Contact a health care provider if:  You develop a skin rash.  You experience sudden, unexplained changes in your weight.  You have more redness, swelling, or pain around  your incision.  You have more fluid or blood coming from your incision.  Your incision feels warm to the touch.  You have pus or a bad smell coming from your incision.  You have a fever. Get help right away if:  You develop chest pain that is different from the pain coming from your incision.  You develop shortness of breath or difficulty breathing.  You start to feel light-headed. These symptoms may represent a serious problem that is an emergency. Do not wait to see if the symptoms will go away. Get medical help right away. Call your local emergency services (911 in the [place] Do not drive yourself to the hospital. This information is not intended to replace advice given to you by your health care provider. Make sure you discuss any questions you have with your health care provider. Document Released: 06/28/2005 Document Revised: 04/11/2017 Document Reviewed: 11/13/2015 Elsevier Interactive Patient Education  2019 [employer]

## 2018-12-27 NOTE — Discharge Summary (Signed)
Physician Discharge Summary       [address]       Cameron [name],[place] [id number]             [phone]    Patient ID: Cameron [name] MRN: [id number] DOB/AGE: Jul 24, 1956 63 y.o.  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  Discharge Diagnoses:  1. S/P Bentall procedure 2. ABL anemia 3. History of OSA-on CPAP 4. History of 5. History of Hyperthyroidism 6. History of GERD (gastroesophageal reflux disease) 7. History of Nephrolithiasis 8. History of Impaired glucose tolerance  Procedure (s):  1. Median Sternotomy 2. Extracorporeal circulation 3.   Replacement of the ascending aorta (hemi-arch) using a 30 mm Hemashield graft under deep hypothermic circulatory arrest 4.   Biological Bentall Procedure using a 23 mm Edwards Magna-Ease pericardial valve and a 26 mm Gelweave Valsalva graft by Cameron [name] on 12/22/2018.  History of Presenting Illness: The patient is a 63 year old gentleman with a history of a heart murmur as a child, hyperthyroidism, kidney stones and OSA on CPAP who was seen by Dr. Diona [name] in 2017 for complaints of palpitations and shortness of breath associated with anxiety and feeling worn out. He had a monitor placed that showed sinus rhythm with an episode of SVT associated with aberrant conduction and retrograde P waves. He was started on Xarelto in case this was atrial fibrillation but after review of the monitor results with Dr. Johney [name] it was felt that this was not atrial fib or flutter and the Xarelto was stopped. He was started on Toprol XL. He had an echo done as part of his workup which showed normal LV systolic function with an EF of 55-60% and grade 1 diastolic dysfunction. There was a possibly bicuspid aortic valve with moderate stenosis with a mean gradient of 14 mm Hg and a peak velocity ratio of 0.34 with a valve area of 1.05 cm2. The aorta  was mildly dilated at 39 mm. CTA of the chest on 11/28/2016 showed a fusiform ascending aortic aneurysm with a diameter of 4.8 cm at the right PA and 4.2 cm at the sinus level.  He was first seen by me on 12/19/2016 and I felt that he had a bicuspid aortic valve with moderate aortic stenosis and a 4.8 cm fusiform ascending aortic aneurysm.  This was below the surgical threshold and I recommended continuing to follow him.  He developed progressive symptoms of exertional fatigue, tiredness, shortness of breath this sounded like they were related to aortic stenosis although his mean gradient was still only in the moderate range at most.  We decided to continue following him and I saw him again on 11/19/2018.  He was now complaining of 6 episodes of dizziness and blurred vision since I seen him previously in March and he had 2 episodes of this in the week that I saw him.  He has felt generally weak with some exertional shortness of breath particularly with lifting.  He continue to work 10-hour days.  He was also having some lower extremity edema.  A repeat echocardiogram showed a mean gradient of 19 mmHg across the aortic valve although it looked severely calcified and stenotic.  The aortic valve area was measured at 0.85 cm.  CTA of the chest showed the aortic root and ascending aorta to be 4.9 cm.  Since he had developed New York Heart Association class III symptoms of congestive  heart failure I felt that it was time to proceed with surgical treatment.  He underwent cardiac catheterization which showed no coronary disease.  Dr. Laneta Simmers was concerned about the appearance of his aortic valve as well as the development of multiple episodes of dizziness and blurred vision with mild exertion. He has New York Heart Association class III symptoms of exertional fatigue and shortness of breath consistent with chronic diastolic congestive heart failure and his echocardiogram shows grade 1 diastolic dysfunction with Doppler  parameters suggesting high ventricular filling pressure. Dr. Laneta Simmers thought it would be best to proceed with aortic valve replacement and replacement of his ascending aortic aneurysm. Dr. Laneta Simmers reviewed the echocardiogram and CT images with him and his wife and answered all their questions.  Cardiac catheterization shows no significant coronary disease.Dr. Laneta Simmers discussed the alternatives of mechanical and bioprosthetic aortic valves. He and his wife would like to use a bioprosthetic valve to avoid the need for Coumadin and Dr. Laneta Simmers though that is a reasonable option at his age.  We will plan to proceed with Bentall procedure using a bioprosthetic valved graft and replacement of the ascending aortic aneurysm using deep hypothermic circulatory arrest. Dr. Laneta Simmers discussed the operative procedure with the patient and his wife including alternatives, benefits and risks. Pre operative carotid duplex US showed no significant internal carotid artery stenosis bilaterally. He underwent a Bentall (aortic tissue valve) on 12/22/2018.  Brief Hospital Course:  The patient was extubated the evening of surgery without difficulty. He remained afebrile and hemodynamically stable. He was initially AV paced. Cameron [name], a line, chest tubes, and foley were removed early in the post operative course. Lopressor was later started. He was volume over loaded and diuresed. He had ABL anemia. He did not require a post op transfusion. Last H and H was 9.1 and 28.6. He was weaned off the insulin drip.  The patient's HGA1C pre op was 5.4. The patient has been surgically stable for transfer from the ICU to PCTU but no bed has been available. He continues to progress with cardiac rehab. He was ambulating on room air. He has been tolerating a diet and has had a bowel movement. Epicardial pacing wires were removed on 12/26/2018. Chest tube sutures will be removed in the office after discharge. The patient has been seen and evaluated by  Cameron [name] Clay and is felt surgically stable for discharge today.   Latest Vital Signs: Blood pressure 114/82, pulse 82, temperature 97.7 F (36.5 C), temperature source Oral, resp. rate (!) 23, height 5\' 5"  (1.651 m), weight 80.3 kg, SpO2 95 %.  Physical Exam: General- alert and comfortable    Neck- no JVD, no cervical adenopathy palpable, no carotid bruit   Lungs- clear without rales, wheezes   Cor- regular rate and rhythm, no murmur , gallop   Abdomen- soft, non-tender   Extremities - warm, non-tender, minimal edema   Neuro- oriented, appropriate, no focal weakness  Discharge Condition: Stable and discharged to home.  Recent laboratory studies:  Lab Results  Component Value Date   WBC 8.9 12/27/2018   HGB 9.1 (L) 12/27/2018   HCT 28.6 (L) 12/27/2018   MCV 93.2 12/27/2018   PLT 190 12/27/2018   Lab Results  Component Value Date   NA 139 12/27/2018   K 3.7 12/27/2018   CL 108 12/27/2018   CO2 23 12/27/2018   CREATININE 0.86 12/27/2018   GLUCOSE 99 12/27/2018    Diagnostic Studies: Dg Chest 2 View  Result Date: 12/19/2018  CLINICAL DATA:  Pre-op for open heart surgery - hx of GERD, sleep apnea, aortic stenosis, AAA, nonsmoker EXAM: CHEST - 2 VIEW COMPARISON:  03/15/2006 FINDINGS: The heart size and mediastinal contours are within normal limits. Both lungs are clear. The visualized skeletal structures are unremarkable. IMPRESSION: No active cardiopulmonary disease. Electronically Signed   By: Norva Pavlov M.D.   On: 12/19/2018 13:05   Vas US Doppler Pre Cabg  Result Date: 12/19/2018 PREOPERATIVE VASCULAR EVALUATION  Indications: Pre-surgical evaluation. Performing Technologist: Gertie Fey MHA, RVT, RDCS, RDMS  Examination Guidelines: A complete evaluation includes B-mode imaging, spectral Doppler, color Doppler, and power Doppler as needed of all accessible portions of each vessel. Bilateral testing is considered an integral part of a complete examination.  Limited examinations for reoccurring indications may be performed as noted.  Right Carotid Findings: +----------+--------+--------+--------+--------+--------+           PSV cm/sEDV cm/sStenosisDescribeComments +----------+--------+--------+--------+--------+--------+ CCA Prox  64      9                                +----------+--------+--------+--------+--------+--------+ CCA Distal52      16                               +----------+--------+--------+--------+--------+--------+ ICA Prox  41      12                               +----------+--------+--------+--------+--------+--------+ ICA Distal87      29                               +----------+--------+--------+--------+--------+--------+ ECA       36      7                                +----------+--------+--------+--------+--------+--------+ Portions of this table do not appear on this page. +----------+--------+-------+----------------+------------+           PSV cm/sEDV cmsDescribe        Arm Pressure +----------+--------+-------+----------------+------------+ Subclavian63             Multiphasic, [id number]          +----------+--------+-------+----------------+------------+ +---------+--------+--+--------+--+---------+ VertebralPSV cm/s28EDV cm/s10Antegrade +---------+--------+--+--------+--+---------+ Left Carotid Findings: +----------+--------+--------+--------+--------+--------+           PSV cm/sEDV cm/sStenosisDescribeComments +----------+--------+--------+--------+--------+--------+ CCA Prox  52      12                               +----------+--------+--------+--------+--------+--------+ CCA Distal65      21                               +----------+--------+--------+--------+--------+--------+ ICA Prox  90      32                               +----------+--------+--------+--------+--------+--------+ ICA Distal67      29                                +----------+--------+--------+--------+--------+--------+  ECA       39      9                                +----------+--------+--------+--------+--------+--------+ +----------+--------+--------+----------------+------------+ SubclavianPSV cm/sEDV cm/sDescribe        Arm Pressure +----------+--------+--------+----------------+------------+           93              Multiphasic, [id number]          +----------+--------+--------+----------------+------------+ +---------+--------+--+--------+--+---------+ VertebralPSV cm/s32EDV cm/s10Antegrade +---------+--------+--+--------+--+---------+  ABI Findings: +--------+------------------+-----+---------+--------+ Right   Rt Pressure (mmHg)IndexWaveform Comment  +--------+------------------+-----+---------+--------+ [id number]                    triphasic         +--------+------------------+-----+---------+--------+ +--------+------------------+-----+---------+-------+ Left    Lt Pressure (mmHg)IndexWaveform Comment +--------+------------------+-----+---------+-------+ [id number]                    triphasic        +--------+------------------+-----+---------+-------+  Right Doppler Findings: +-----------+--------+-----+---------+-----------------------------------------+ Site       PressureIndexDoppler  Comments                                  +-----------+--------+-----+---------+-----------------------------------------+ Brachial   142          triphasic                                          +-----------+--------+-----+---------+-----------------------------------------+ Radial                  triphasic                                          +-----------+--------+-----+---------+-----------------------------------------+ Ulnar                   triphasic                                          +-----------+--------+-----+---------+-----------------------------------------+ Palmar Arch                       Signal is unaffected with radial                                           compression, obliterates with ulnar                                        compression.                              +-----------+--------+-----+---------+-----------------------------------------+  Left Doppler Findings: +-----------+--------+-----+---------+-----------------------------------------+ Site       PressureIndexDoppler  Comments                                  +-----------+--------+-----+---------+-----------------------------------------+  Brachial   136          triphasic                                          +-----------+--------+-----+---------+-----------------------------------------+ Radial                  triphasic                                          +-----------+--------+-----+---------+-----------------------------------------+ Ulnar                   triphasic                                          +-----------+--------+-----+---------+-----------------------------------------+ Palmar Arch                      Signal is unaffected with radial                                           compression, obliterates with ulnar                                        compression.                              +-----------+--------+-----+---------+-----------------------------------------+  Summary: Right Carotid: Velocities in the right ICA are consistent with a 1-39% stenosis. Left Carotid: Velocities in the left ICA are consistent with a 1-39% stenosis. Vertebrals:  Bilateral vertebral arteries demonstrate antegrade flow. Subclavians: Normal flow hemodynamics were seen in bilateral subclavian              arteries.  Electronically signed by Gretta Beganodd Early MD on 12/19/2018 at 2:29:54 PM.    Final    Discharge Medications: Allergies as of 12/27/2018      Reactions   Tramadol Hcl Itching   Patient tolerates specific [employer] generic product - family  is able to bring supply in from home.    Demerol [meperidine] Anxiety, Other (See Comments)   Makes pt hyper, the higher the dose the more hyper pt gets   Stadol [butorphanol] Anxiety, Other (See Comments)   Hyper- the higher the dose the more hyper pt gets      Medication List    STOP taking these medications   traMADol 50 MG tablet Commonly known as:  ULTRAM     TAKE these medications   acetaminophen 500 MG tablet Commonly known as:  TYLENOL Take 1,000 mg by mouth every 6 (six) hours as needed (pain).   aspirin 325 MG EC tablet Take 1 tablet (325 mg total) by mouth daily.   cholecalciferol 25 MCG (1000 UT) tablet Commonly known as:  VITAMIN D3 Take 2,000 Units by mouth daily with supper.   hydrocortisone cream 1 % Apply topically 2 (two) times daily.   ibuprofen 200 MG tablet Commonly known as:  ADVIL,MOTRIN Take 600 mg by  mouth every 8 (eight) hours as needed (for pain.).   methimazole 5 MG tablet Commonly known as:  TAPAZOLE Take 2.5 mg by mouth every other day.   metoprolol succinate 25 MG 24 hr tablet Commonly known as:  TOPROL XL Take 1 tablet (25 mg total) by mouth daily.   oxyCODONE 5 MG immediate release tablet Commonly known as:  Oxy IR/ROXICODONE Take 1 tablet (5 mg total) by mouth every 4 (four) hours as needed for severe pain.   promethazine 25 MG tablet Commonly known as:  PHENERGAN Take 25 mg by mouth every 6 (six) hours as needed for nausea or vomiting.   tamsulosin 0.4 MG Caps capsule Commonly known as:  FLOMAX Take 1 capsule (0.4 mg total) by mouth daily after supper.      The patient has been discharged on:   1.Beta Blocker:  Yes [ x  ]                              No   [   ]                              If No, reason:  2.Ace Inhibitor/ARB: Yes [   ]                                     No  [    ]                                     If No, reason:Labile BP  3.Statin:   Yes [   ]                  No  [  x ]                  If No,  reason:No CAD  4.Marlowe Kays:  Yes  [ x  ]                  No   [   ]                  If No, reason:  Follow Up Appointments: Follow-up Information    Rollene Rotunda, MD. Go on 01/19/2019.   Specialty:  Cardiology Why:  Appointment time is at 11:20 am Contact information: [address] [place] [address] [phone]        Alleen Borne, MD. Go on 01/28/2019.   Specialty:  Cardiothoracic Surgery Why:  PA/LAT CXR to be taken (at [facility] which is in the same buidling as Dr. Sharee Pimple office) on 01/28/2019 at 10:30 am;Appointment time is at 11:00 am Contact information: [address] [place] [address] [phone]        Nurse. Go on 01/05/2019.   Why:   Appointment is for chest tube suture removal only. Office will call with appointment time. Contact information: [address] [employer] [address] [place] [address]          Signed: Lelon Huh [facility] 12/27/2018, 10:37 AM

## 2018-12-27 NOTE — Plan of Care (Signed)
Adequate for discharge.

## 2018-12-27 NOTE — Progress Notes (Signed)
5 Days Post-Op Procedure(s) (LRB): BENTALL PROCEDURE (N/A) REPLACEMENT ASCENDING AORTA (N/A) TRANSESOPHAGEAL ECHOCARDIOGRAM (TEE) (N/A) Subjective: WBC back to normal pain controlled Ready for DC home DC instructions on wound care, activity, meds d/w patient Objective: Vital signs in last 24 hours: Temp:  [97.7 F (36.5 C)-98.3 F (36.8 C)] 97.7 F (36.5 C) (01/04 0700) Pulse Rate:  [69-89] 82 (01/04 0700) Cardiac Rhythm: Normal sinus rhythm (01/04 0400) Resp:  [15-26] 23 (01/04 0700) BP: (114-121)/(77-82) 114/82 (01/04 0400) SpO2:  [95 %-100 %] 95 % (01/04 0700)  Hemodynamic parameters for last 24 hours:    Intake/Output from previous day: 01/03 0701 - 01/04 0700 In: 813 [P.O.:810; I.V.:3] Out: 150 [Urine:150] Intake/Output this shift: Total I/O In: 360 [P.O.:360] Out: -        Exam    General- alert and comfortable    Neck- no JVD, no cervical adenopathy palpable, no carotid bruit   Lungs- clear without rales, wheezes   Cor- regular rate and rhythm, no murmur , gallop   Abdomen- soft, non-tender   Extremities - warm, non-tender, minimal edema   Neuro- oriented, appropriate, no focal weakness   Lab Results: Recent Labs    12/27/18 0323  WBC 8.9  HGB 9.1*  HCT 28.6*  PLT 190   BMET:  Recent Labs    12/27/18 0323  NA 139  K 3.7  CL 108  CO2 23  GLUCOSE 99  BUN 20  CREATININE 0.86  CALCIUM 8.0*    PT/INR: No results for input(s): LABPROT, INR in the last 72 hours. ABG    Component Value Date/Time   PHART 7.333 (L) 12/22/2018 2201   HCO3 24.9 12/22/2018 2201   TCO2 25 12/23/2018 1709   ACIDBASEDEF 1.0 12/22/2018 2201   O2SAT 97.0 12/22/2018 2201   CBG (last 3)  No results for input(s): GLUCAP in the last 72 hours.  Assessment/Plan: S/P Procedure(s) (LRB): BENTALL PROCEDURE (N/A) REPLACEMENT ASCENDING AORTA (N/A) TRANSESOPHAGEAL ECHOCARDIOGRAM (TEE) (N/A) DC home   LOS: 5 days    Kathlee Nations Trigt III 12/27/2018

## 2018-12-27 NOTE — Progress Notes (Signed)
[id number] Pt stated he has walked today with and without walker and has one at home if needed. Reviewed staying in the tube and sternal precautions. Encouraged IS and gave walking instructions. Gave heart healthy diet and encouraged to watch sodium. Discussed CRP 2 but pt not sure he wants to attend. Wants to think about. Left brochure for [place]. Understanding voiced by pt and wife. Luetta Nutting RN BSN 12/27/2018 10:06 AM

## 2018-12-31 MED FILL — Heparin Sodium (Porcine) Inj 1000 Unit/ML: INTRAMUSCULAR | Qty: 10 | Status: AC

## 2018-12-31 MED FILL — Sodium Chloride IV Soln 0.9%: INTRAVENOUS | Qty: 2000 | Status: AC

## 2018-12-31 MED FILL — Sodium Bicarbonate IV Soln 8.4%: INTRAVENOUS | Qty: 100 | Status: AC

## 2018-12-31 MED FILL — Electrolyte-R (PH 7.4) Solution: INTRAVENOUS | Qty: 5000 | Status: AC

## 2018-12-31 MED FILL — Mannitol IV Soln 20%: INTRAVENOUS | Qty: 500 | Status: AC

## 2018-12-31 MED FILL — Lidocaine HCl(Cardiac) IV PF Soln Pref Syr 100 MG/5ML (2%): INTRAVENOUS | Qty: 5 | Status: AC

## 2019-01-05 ENCOUNTER — Encounter (INDEPENDENT_AMBULATORY_CARE_PROVIDER_SITE_OTHER): Payer: Self-pay

## 2019-01-05 DIAGNOSIS — Z4802 Encounter for removal of sutures: Secondary | ICD-10-CM

## 2019-01-17 NOTE — Progress Notes (Addendum)
Cardiology Office Note   Date:  01/19/2019   ID:  Cameron [name], DOB August 26, 1956, MRN [id number]  PCP:  Kari Baars, MD  Cardiologist:   No primary care provider on file.   Chief Complaint  Patient presents with  . AVR      History of Present Illness: Cameron [name] is a 63 y.o. 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   He presents for post op followup.  I have not seen him previously.  He was followed a couple of years ago by another physician in our practice.  We did see him in the office prior to his catheterization prior to surgery.  However, I reviewed the hospital records.  He had an exceptionally uncomplicated hospital course.  There was a question of atrial fibrillation but this was not confirmed.  He was sent home on minimal meds as below.  He has done very well.  The patient denies any new symptoms such as chest discomfort, neck or arm discomfort. There has been no new shortness of breath, PND or orthopnea. There have been no reported palpitations, presyncope or syncope.  He has no fevers or chills.  He has been doing some mild activities around the house without complaints.    Past Medical History:  Diagnosis Date  . Aortic stenosis, severe   . Arthritis   . Ascending aortic aneurysm (HCC)   . GERD (gastroesophageal reflux disease)   . Headache   . History of heart murmur in childhood   . History of kidney stones   . Hyperthyroidism   . Impaired glucose tolerance   . Nephrolithiasis   . Sleep apnea    On CPAP    Past Surgical History:  Procedure Laterality Date  . BENTALL PROCEDURE N/A 12/22/2018   Procedure: BENTALL PROCEDURE;  Surgeon: Alleen Borne, MD;  Location: [facility] OR;  Service: Open Heart Surgery;  Laterality: N/A;  NEEDS BILATERAL RADIAL ARTERIAL LINES  . CARDIAC CATHETERIZATION  1990s  . CYSTOSCOPY  1990s   With stone extraction  . CYSTOSCOPY W/ URETERAL STENT PLACEMENT Left 12/02/2015   Procedure: CYSTOSCOPY WITH RETROGRADE PYELOGRAM/URETERAL STENT PLACEMENT;  Surgeon: Malen Gauze, MD;  Location: [facility] ORS;  Service: Urology;  Laterality: Left;  . ESOPHAGOGASTRODUODENOSCOPY  1990s  . KNEE ARTHROSCOPY Left 2005  . LITHOTRIPSY  1990s  . LITHOTRIPSY Left 11/2015  . REPLACEMENT ASCENDING AORTA N/A 12/22/2018   Procedure: REPLACEMENT ASCENDING AORTA;  Surgeon: Alleen Borne, MD;  Location: [facility] OR;  Service: Open Heart Surgery;  Laterality: N/A;  . RIGHT/LEFT HEART CATH AND CORONARY ANGIOGRAPHY N/A 12/09/2018   Procedure: RIGHT/LEFT HEART CATH AND CORONARY ANGIOGRAPHY;  Surgeon: Corky Crafts, MD;  Location: [facility] INVASIVE CV LAB;  Service: Cardiovascular;  Laterality: N/A;  . TEE WITHOUT CARDIOVERSION N/A 12/22/2018   Procedure: TRANSESOPHAGEAL ECHOCARDIOGRAM (TEE);  Surgeon: Alleen Borne, MD;  Location: [facility] OR;  Service: Open Heart Surgery;  Laterality: N/A;  . ULTRASOUND GUIDANCE FOR VASCULAR ACCESS  12/09/2018   Procedure: Ultrasound Guidance For Vascular Access;  Surgeon: Corky Crafts, MD;  Location: [facility] INVASIVE CV LAB;  Service: Cardiovascular;;  . UMBILICAL HERNIA REPAIR  late 1990s     Current Outpatient Medications  Medication Sig Dispense Refill  . aspirin EC 325 MG EC tablet Take 1 tablet (325 mg total) by mouth daily. 30 tablet 0  . docusate sodium (COLACE) 100 MG capsule Take 100 mg by mouth daily.    Marland Kitchen  metoprolol succinate (TOPROL XL) 25 MG 24 hr tablet Take 1 tablet (25 mg total) by mouth daily. 90 tablet 3  . promethazine (PHENERGAN) 25 MG tablet Take 25 mg by mouth every 6 (six) hours as needed for nausea or vomiting.    . tamsulosin (FLOMAX) 0.4 MG CAPS capsule Take 1 capsule (0.4 mg total) by mouth daily after supper. 30 capsule 0  . traMADol (ULTRAM) 50 MG tablet Take 50 mg by mouth as needed.    Marland Kitchen acetaminophen (TYLENOL) 500 MG tablet Take 1,000 mg by mouth every 6 (six) hours as needed (pain).     No current facility-administered  medications for this visit.     Allergies:   Tramadol hcl; Demerol [meperidine]; and Stadol [butorphanol]    ROS:  Please see the history of present illness.   Otherwise, review of systems are positive for none.   All other systems are reviewed and negative.    PHYSICAL EXAM: VS:  BP 118/80 (BP Location: Right Arm, Patient Position: Sitting, Cuff Size: Normal)   Pulse 63   Ht 5\' 5"  (1.651 m)   Wt 174 lb (78.9 kg)   BMI 28.96 kg/m  , BMI Body mass index is 28.96 kg/m. GENERAL:  Well appearing NECK:  No jugular venous distention, waveform within normal limits, carotid upstroke brisk and symmetric, no bruits, no thyromegaly LUNGS:  Clear to auscultation bilaterally BACK:  No CVA tenderness CHEST:  Well healed sternotomy scar.  No drainage or erythema HEART:  PMI not displaced or sustained,S1 and S2 within normal limits, no S3, no S4, no clicks, no rubs, soft brief apical nonradiating systolic murmur, no diastolic murmurs ABD:  Flat, positive bowel sounds normal in frequency in pitch, no bruits, no rebound, no guarding, no midline pulsatile mass, no hepatomegaly, no splenomegaly, appears to be a small retained stitch in a chest tube wound.   EXT:  2 plus pulses throughout, no edema, no cyanosis no clubbing    EKG:  EKG is ordered today. The ekg ordered today demonstrates sinus rhythm, rate 63, left axis deviation, no acute ST-T wave changes.   Recent Labs: 12/19/2018: ALT 22 12/23/2018: Magnesium 2.3 12/27/2018: BUN 20; Creatinine, Ser 0.86; Hemoglobin 9.1; Platelets 190; Potassium 3.7; Sodium 139    Lipid Panel No results found for: CHOL, TRIG, HDL, CHOLHDL, VLDL, LDLCALC, LDLDIRECT    Wt Readings from Last 3 Encounters:  01/19/19 174 lb (78.9 kg)  12/26/18 177 lb 0.5 oz (80.3 kg)  12/19/18 178 lb 2 oz (80.8 kg)      Other studies Reviewed: Additional studies/ records that were reviewed today include: Hospital records. Review of the above records demonstrates:  Please  see elsewhere in the note.     ASSESSMENT AND PLAN:  AVR:   He is doing well status post AVR Bentall.  He will continue with usual postop care.  He can reduce his aspirin to 81 mg daily at 3 months.    HTN:  The blood pressure is at target. No change in medications is indicated. We will continue with therapeutic lifestyle changes (TLC).  This actually running slightly low.  He will let me know if it runs low and cause of symptoms at which point we would consider stopping the metoprolol.  PSVT: There was a mention previously of atrial fibrillation.  This was seen at at his endocrinologist office.  There was a question of this in the hospital but it was not confirmed when we reviewed telemetry.  His wife has  an Alive Cor and she is in medicine and will keep an eye on this.  There is no need to change therapy.  SLEEP APNEA:  Needs new patient appt with Dr. Tresa EndoKelly.   Current medicines are reviewed at length with the patient today.  The patient does not have concerns regarding medicines.  The following changes have been made:  no change  Labs/ tests ordered today include: None  Orders Placed This Encounter  Procedures  . EKG 12-Lead     Disposition:   FU with me in June    Signed, Cameron Mcclune, MD  01/19/2019 12:36 PM    [facility]

## 2019-01-19 ENCOUNTER — Encounter: Payer: Self-pay | Admitting: Cardiology

## 2019-01-19 ENCOUNTER — Ambulatory Visit (INDEPENDENT_AMBULATORY_CARE_PROVIDER_SITE_OTHER): Payer: Commercial Managed Care - PPO | Admitting: Cardiology

## 2019-01-19 ENCOUNTER — Ambulatory Visit (INDEPENDENT_AMBULATORY_CARE_PROVIDER_SITE_OTHER): Payer: Commercial Managed Care - PPO | Admitting: Otolaryngology

## 2019-01-19 VITALS — BP 118/80 | HR 63 | Ht 65.0 in | Wt 174.0 lb

## 2019-01-19 DIAGNOSIS — I350 Nonrheumatic aortic (valve) stenosis: Secondary | ICD-10-CM | POA: Diagnosis not present

## 2019-01-19 DIAGNOSIS — Z952 Presence of prosthetic heart valve: Secondary | ICD-10-CM

## 2019-01-19 DIAGNOSIS — Z95828 Presence of other vascular implants and grafts: Secondary | ICD-10-CM

## 2019-01-19 NOTE — Patient Instructions (Signed)
Medication Instructions:  Continue current medications  If you need a refill on your cardiac medications before your next appointment, please call your pharmacy.  Labwork: None Ordered   Take the provided lab slips with you to the lab for your blood draw.   When you have your labs (blood work) drawn today and your tests are completely normal, you will receive your results only by MyChart Message (if you have MyChart) -OR-  A paper copy in the mail.  If you have any lab test that is abnormal or we need to change your treatment, we will call you to review these results.  Testing/Procedures: None Ordered  Follow-Up: You will need a follow up appointment in 6 months.  Please call our office 2 months in advance to schedule this appointment.  You may see Dr Hochrein or one of the following Advanced Practice Providers on your designated Care Team:   Rhonda Barrett, PA-C . Kathryn Lawrence, DNP, ANP    At [facility], you and your health needs are our priority.  As part of our continuing mission to provide you with exceptional heart care, we have created designated Provider Care Teams.  These Care Teams include your primary Cardiologist (physician) and Advanced Practice Providers (APPs -  Physician Assistants and Nurse Practitioners) who all work together to provide you with the care you need, when you need it.  Thank you for choosing [facility] at [facility]!!     

## 2019-01-21 IMAGING — DX DG CHEST 1V PORT
1 series · 1 of 1 positions shown · non-contrast
Comparison: CT 01/16/2018, 10/16/2018, radiograph 12/19/2018

CLINICAL DATA: Status post resection of aneurysm

EXAM:
PORTABLE CHEST 1 VIEW

[chest]
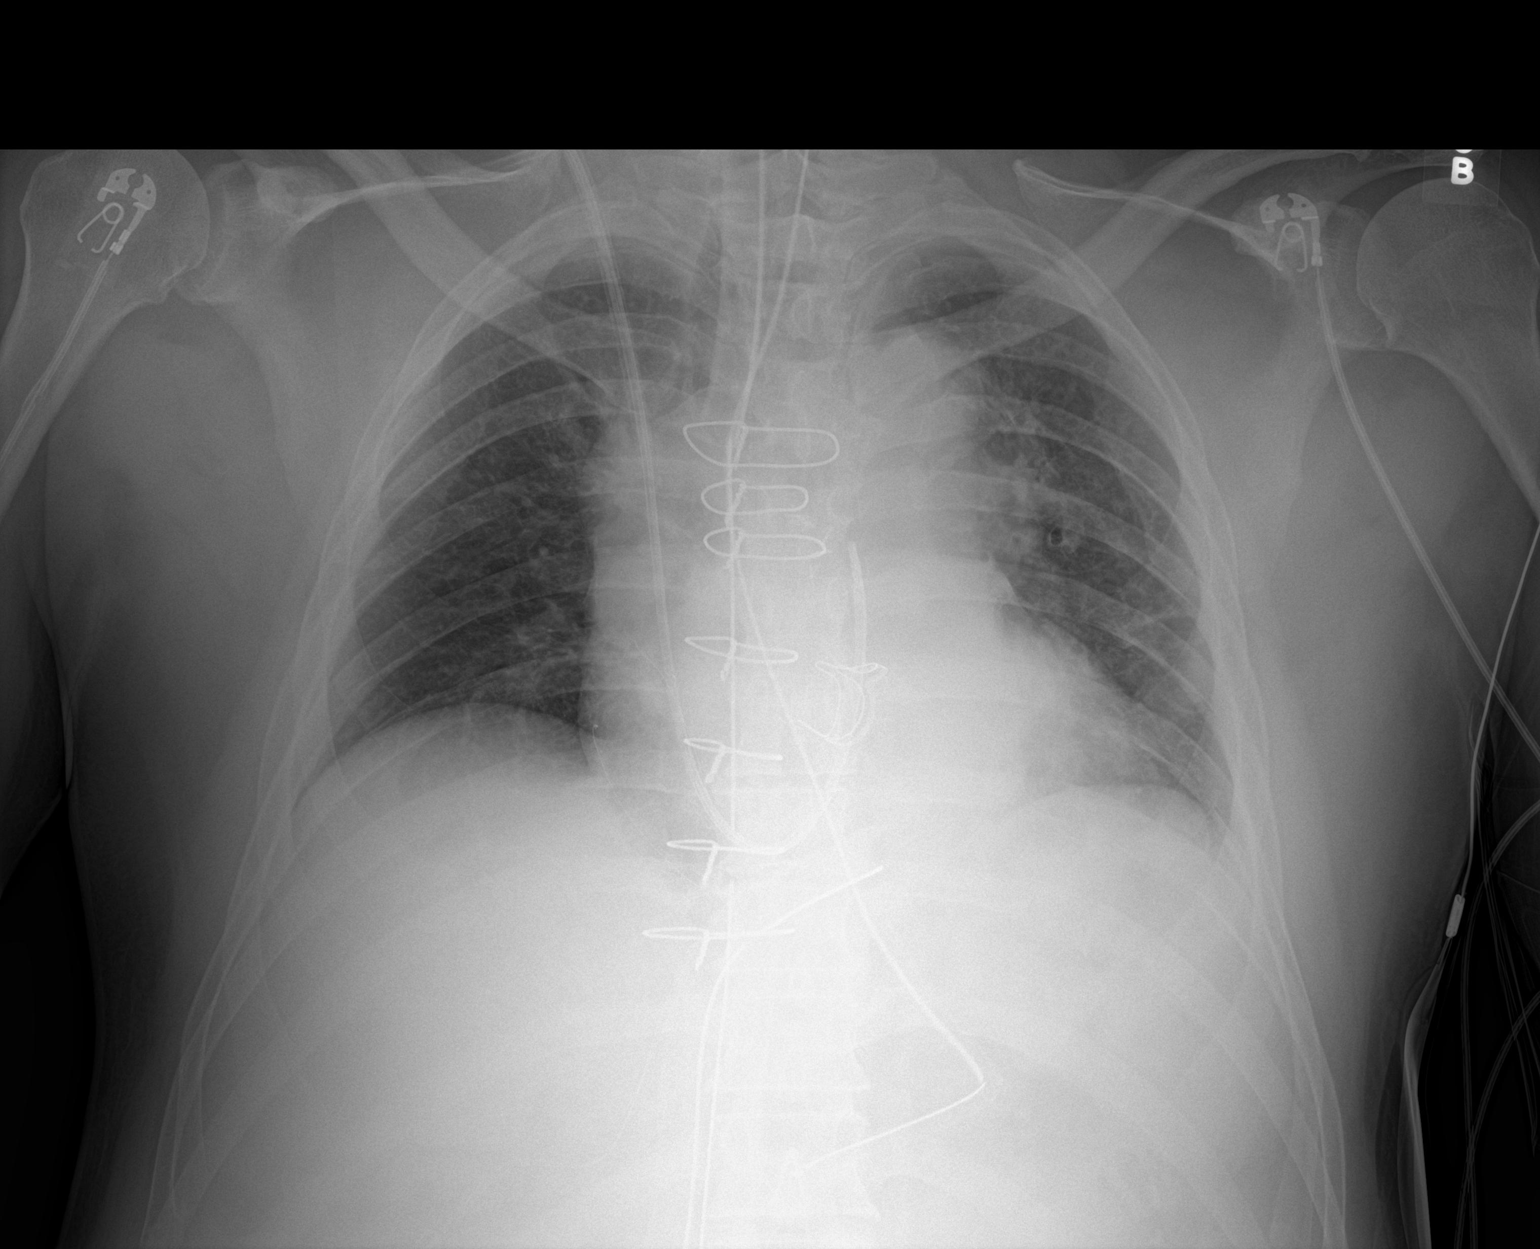

[1 of 1 positions shown; findings below may reference images not displayed]

FINDINGS: Interval intubation, tip of the endotracheal tube is 6 mm above the
carina. Esophageal tube tip projects over the stomach. Right IJ
Swan-Ganz catheter tip projecting over pulmonary outflow. Interval
valve replacement and sternotomy. Placement of mediastinal drainage
catheters.

Low lung volumes. Mediastinum is enlarged. Heart size is slightly
enlarged. No pneumothorax. Subsegmental atelectasis at the left
base.
IMPRESSION: 1. Placement of support lines and tubes as above, tip of
endotracheal tube is just above the carina.
2. Interval sternotomy and valve replacement. Enlarged mediastinal
silhouette, likely combination of low lung volume and history of
aneurysm repair. Subsegmental atelectasis at the left base.

## 2019-01-28 ENCOUNTER — Encounter: Payer: Self-pay | Admitting: Surgery

## 2019-01-28 ENCOUNTER — Ambulatory Visit
Admission: RE | Admit: 2019-01-28 | Discharge: 2019-01-28 | Disposition: A | Discharge status: Home / Self Care | Payer: Commercial Managed Care - PPO | Source: Ambulatory Visit | Attending: Surgery | Admitting: Surgery

## 2019-01-28 ENCOUNTER — Other Ambulatory Visit: Payer: Self-pay | Admitting: Surgery

## 2019-01-28 ENCOUNTER — Ambulatory Visit (INDEPENDENT_AMBULATORY_CARE_PROVIDER_SITE_OTHER): Payer: Self-pay | Admitting: Surgery

## 2019-01-28 ENCOUNTER — Other Ambulatory Visit: Payer: Self-pay

## 2019-01-28 VITALS — BP 122/86 | HR 59 | Resp 18 | Ht 65.0 in | Wt 173.0 lb

## 2019-01-28 DIAGNOSIS — Z952 Presence of prosthetic heart valve: Secondary | ICD-10-CM

## 2019-01-28 DIAGNOSIS — Z8679 Personal history of other diseases of the circulatory system: Secondary | ICD-10-CM

## 2019-01-28 DIAGNOSIS — Z9889 Other specified postprocedural states: Secondary | ICD-10-CM

## 2019-01-28 NOTE — Progress Notes (Signed)
      HPI: Patient returns for routine postoperative follow-up having undergone replacement of an ascending aortic aneurysm and biological Bentall procedure using a 23 mm Edwards pericardial valve on 12/22/2018. The patient's early postoperative recovery while in the hospital was notable for an uncomplicated postoperative course. Since hospital discharge the patient reports that he has been feeling well and is walking about 15 minutes/day on a treadmill without chest pain or shortness of breath.  He recently saw Dr. Antoine PocheHochrein for initial cardiology follow-up.   Current Outpatient Medications  Medication Sig Dispense Refill  . acetaminophen (TYLENOL) 500 MG tablet Take 1,000 mg by mouth every 6 (six) hours as needed (pain).    Marland Kitchen. aspirin EC 325 MG EC tablet Take 1 tablet (325 mg total) by mouth daily. 30 tablet 0  . docusate sodium (COLACE) 100 MG capsule Take 100 mg by mouth daily.    . metoprolol succinate (TOPROL XL) 25 MG 24 hr tablet Take 1 tablet (25 mg total) by mouth daily. 90 tablet 3  . promethazine (PHENERGAN) 25 MG tablet Take 25 mg by mouth every 6 (six) hours as needed for nausea or vomiting.    . tamsulosin (FLOMAX) 0.4 MG CAPS capsule Take 1 capsule (0.4 mg total) by mouth daily after supper. 30 capsule 0  . traMADol (ULTRAM) 50 MG tablet Take 50 mg by mouth as needed.     No current facility-administered medications for this visit.     Physical Exam: BP 122/86 (BP Location: Left Arm, Patient Position: Sitting, Cuff Size: Large)   Pulse (!) 59   Resp 18   Ht 5\' 5"  (1.651 m)   Wt 173 lb (78.5 kg)   SpO2 97% Comment: RA  BMI 28.79 kg/m  He looks well. Cardiac exam shows a regular rate and rhythm with normal heart sounds. Lungs are clear. The chest incision is healing well and the sternum is stable. There is no peripheral edema.  Diagnostic Tests:  CLINICAL DATA:  Aortic valve replacement  EXAM: CHEST - 2 VIEW  COMPARISON:  12/24/2018 chest  radiograph.  FINDINGS: Intact sternotomy wires. Aortic valve prosthesis is in place. Stable cardiomediastinal silhouette with mild cardiomegaly. No pneumothorax. No pleural effusion. Lungs appear clear, with no acute consolidative airspace disease and no pulmonary edema.  IMPRESSION: Stable mild cardiomegaly. No pulmonary edema. No pleural effusions. No active pulmonary disease.   Electronically Signed   By: Delbert PhenixJason A Poff M.D.   On: 01/28/2019 10:38   Impression:  Overall Mr. Barry DienesOwens is making a good recovery following his surgery.  I encouraged him to continue walking with a goal of walking at least 30 minutes at a time daily.  I told him he can return to driving his car but should refrain lifting anything heavier than 10 pounds for 3 months postoperatively.  He has been walking on a treadmill at home and is not planning on participating in cardiac rehab.  Plan:  He will continue to follow-up with Dr. Antoine PocheHochrein and I will plan to see him back in 1 year with a CTA of the chest to reevaluate the remainder of his thoracic aorta.   Alleen BorneBryan K Malgorzata Albert, MD [facility] [phone]

## 2019-02-27 IMAGING — DX DG CHEST 2V
2 series · 2 of 2 positions shown · non-contrast
Comparison: 12/24/2018 chest radiograph.

CLINICAL DATA: Aortic valve replacement

EXAM:
CHEST - 2 VIEW

[dg chest 2 view (1 of 2)]
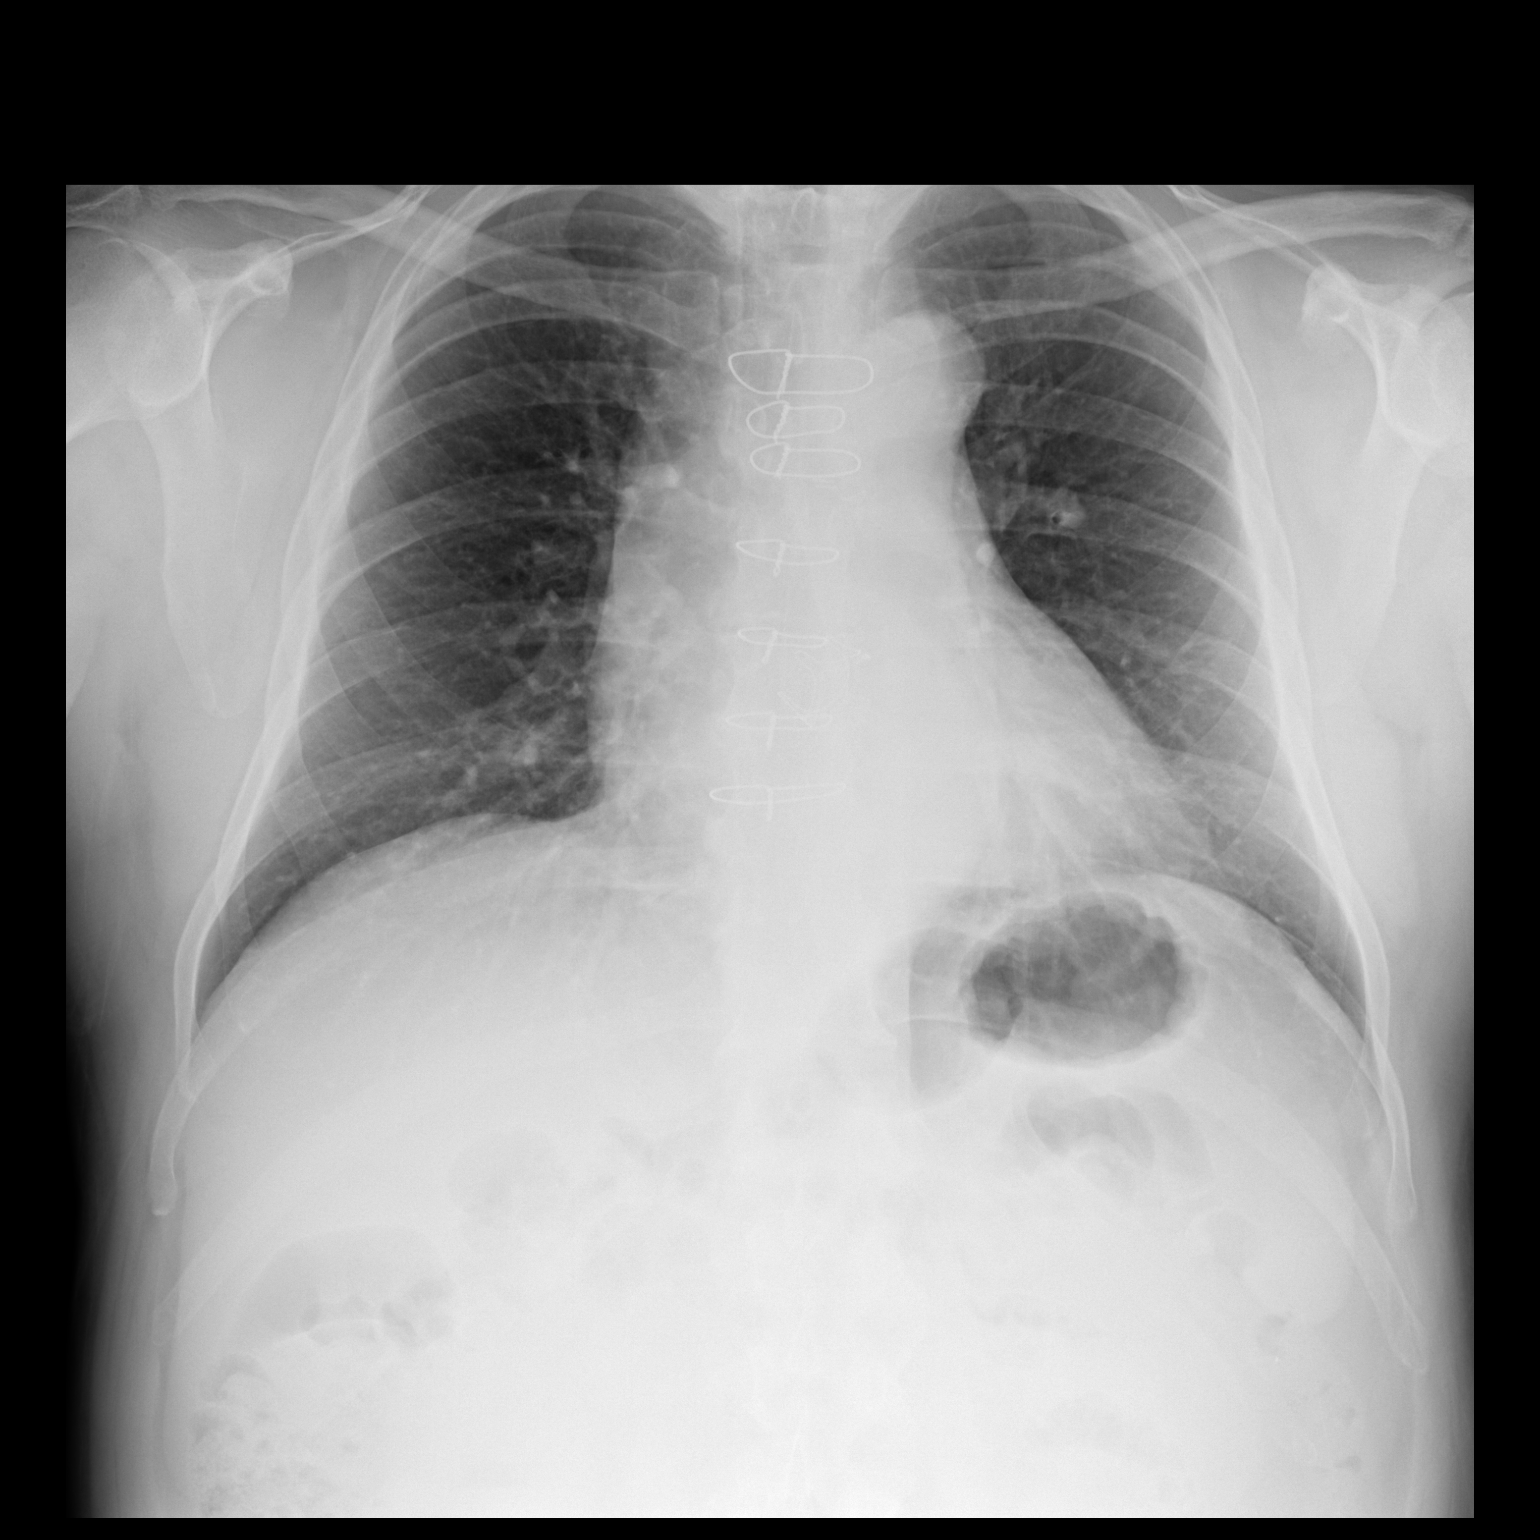

[dg chest 2 view (2 of 2)]
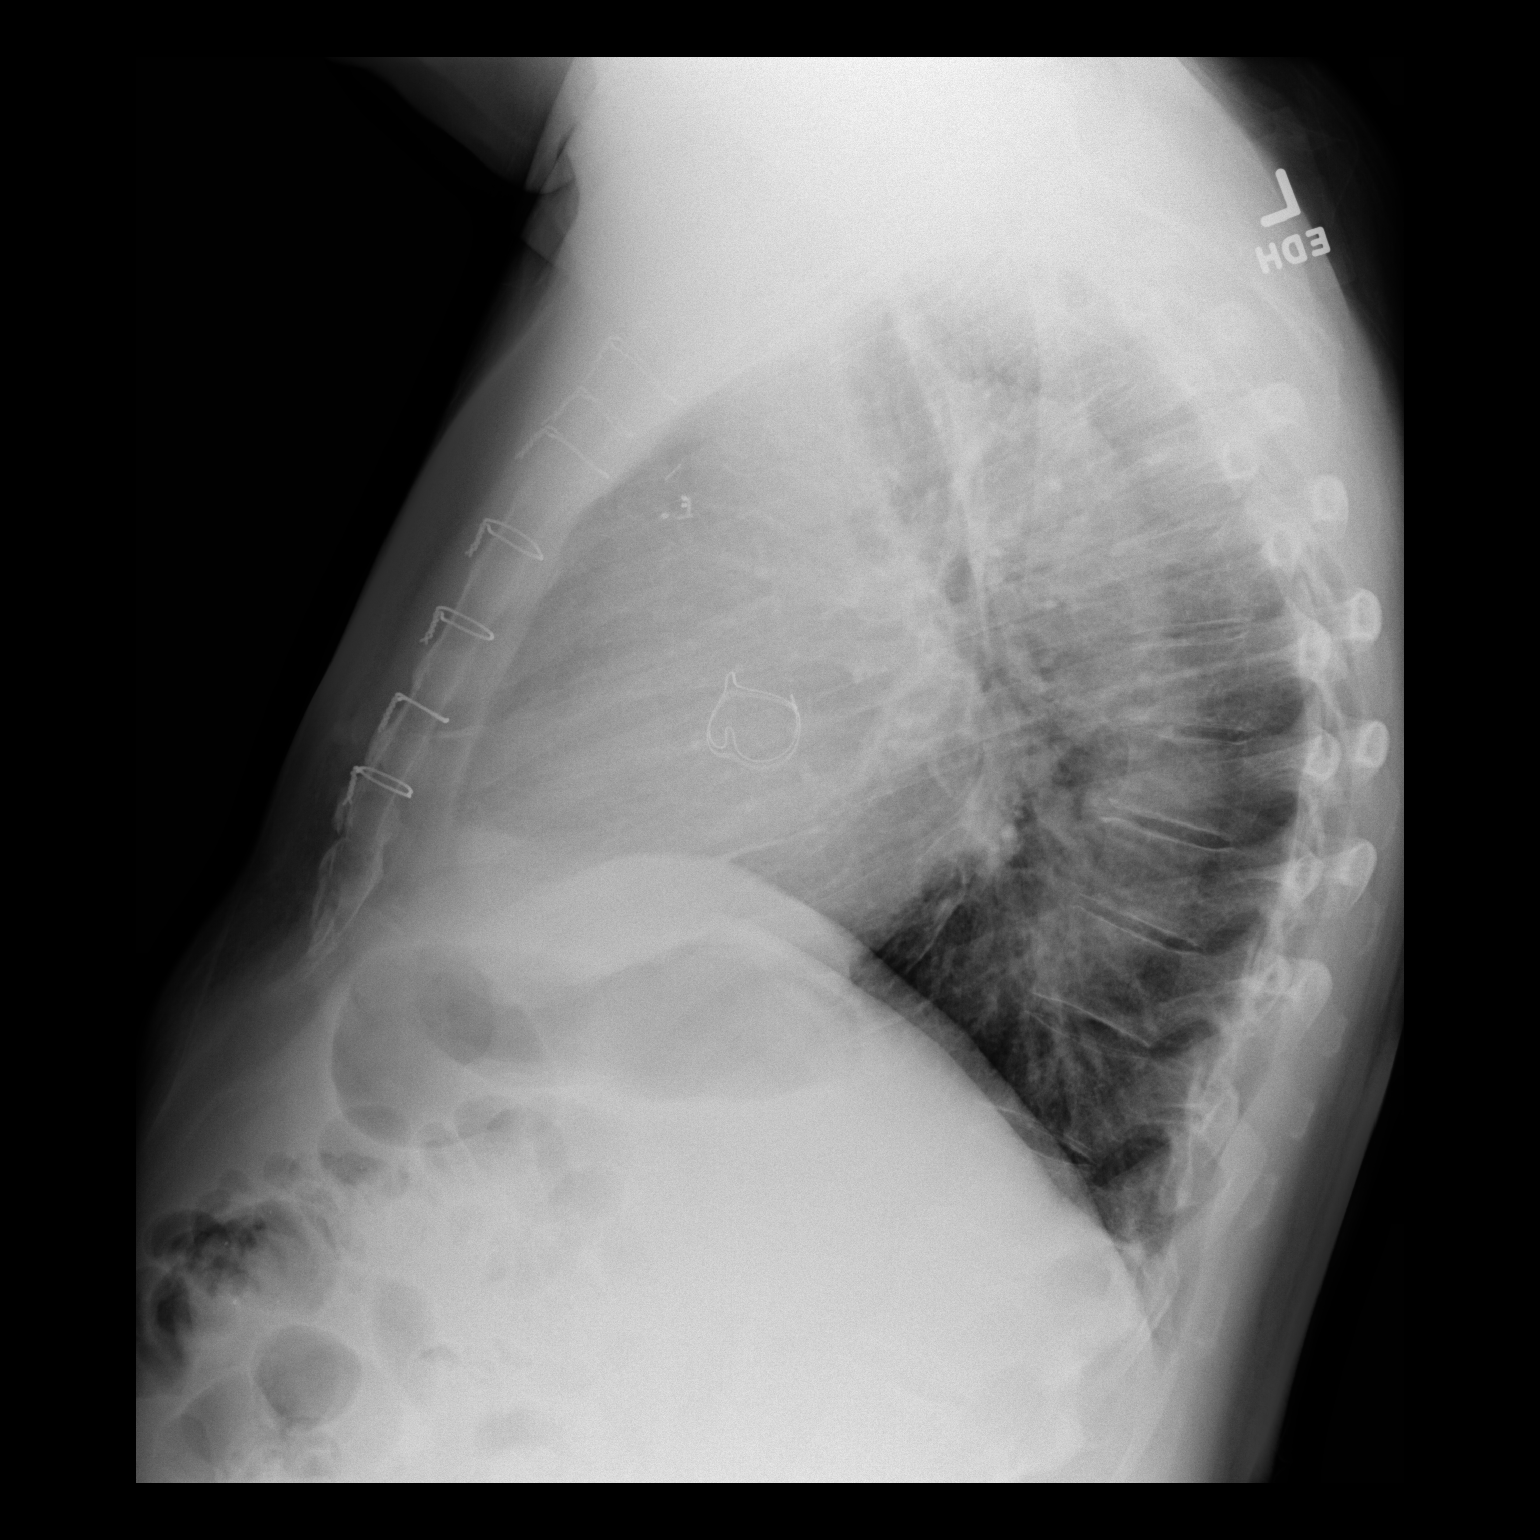

[2 of 2 positions shown; findings below may reference images not displayed]

FINDINGS: Intact sternotomy wires. Aortic valve prosthesis is in place. Stable
cardiomediastinal silhouette with mild cardiomegaly. No
pneumothorax. No pleural effusion. Lungs appear clear, with no acute
consolidative airspace disease and no pulmonary edema.
IMPRESSION: Stable mild cardiomegaly. No pulmonary edema. No pleural effusions.
No active pulmonary disease.

## 2019-04-06 ENCOUNTER — Telehealth: Payer: Self-pay | Admitting: Cardiovascular Disease

## 2019-04-06 NOTE — Telephone Encounter (Signed)
Smartphone/ my chart/ consented to virtual/pre reg completed °

## 2019-04-07 ENCOUNTER — Telehealth (INDEPENDENT_AMBULATORY_CARE_PROVIDER_SITE_OTHER): Payer: Commercial Managed Care - PPO | Admitting: Cardiovascular Disease

## 2019-04-07 ENCOUNTER — Encounter: Payer: Self-pay | Admitting: Cardiovascular Disease

## 2019-04-07 DIAGNOSIS — G4733 Obstructive sleep apnea (adult) (pediatric): Secondary | ICD-10-CM | POA: Diagnosis not present

## 2019-04-07 DIAGNOSIS — Z9889 Other specified postprocedural states: Secondary | ICD-10-CM

## 2019-04-07 DIAGNOSIS — Z952 Presence of prosthetic heart valve: Secondary | ICD-10-CM

## 2019-04-07 DIAGNOSIS — I350 Nonrheumatic aortic (valve) stenosis: Secondary | ICD-10-CM | POA: Diagnosis not present

## 2019-04-07 DIAGNOSIS — Z9989 Dependence on other enabling machines and devices: Secondary | ICD-10-CM

## 2019-04-07 DIAGNOSIS — I10 Essential (primary) hypertension: Secondary | ICD-10-CM

## 2019-04-07 DIAGNOSIS — Z8679 Personal history of other diseases of the circulatory system: Secondary | ICD-10-CM

## 2019-04-07 NOTE — Progress Notes (Signed)
Virtual Visit via Video Note   This visit type was conducted due to national recommendations for restrictions regarding the COVID-19 Pandemic (e.g. social distancing) in an effort to limit this patient's exposure and mitigate transmission in our community.  Due to his co-morbid illnesses, this patient is at least at moderate risk for complications without adequate follow up.  This format is felt to be most appropriate for this patient at this time.  All issues noted in this document were discussed and addressed.  A limited physical exam was performed with this format.  Please refer to the patient's chart for his consent to telehealth for [facility].   Evaluation Performed:  Follow-up visit  This visit type was conducted due to national recommendations for restrictions regarding the COVID-19 Pandemic (e.g. social distancing).  This format is felt to be most appropriate for this patient at this time.  All issues noted in this document were discussed and addressed.  No physical exam was performed (except for noted visual exam findings with Video Visits).  Please refer to the patient's chart (MyChart message for video visits and phone note for telephone visits) for the patient's consent to telehealth for [facility].  Verbal consent was obtained with the patient to proceed with this visit and bill insurance.  Date:  04/07/2019   ID:  Cameron [name], DOB 10-Jan-1956, MRN [id number]  Patient Location: [place] [address]   Provider location:   [place], [place]  PCP:  Sinda Du, MD  Cardiologist:  No primary care provider on file.  Electrophysiologist:  None   Chief Complaint:  Sleep apnea  History of Present Illness:    Cameron [name] is a 63 y.o. male who presents via audio/video conferencing for a telehealth visit today.    The patient does not symptoms concerning for COVID-19 infection (fever, chills, cough, or new SHORTNESS OF BREATH).   Mr.  Cameron [name] is a 63 year old gentleman who is followed by Dr. Percival Spanish for his cardiology care.  He is referred for his new evaluation with me today in light of his obstructive sleep apnea, originally diagnosed in 2005.  Mr. Roxy Manns states that he underwent a sleep study at [facility] which reportedly was interpreted by Dr. Merlene Laughter.  By his description, he met split-night criteria there within 2 hours CPAP was implemented.  He has been on CPAP therapy since 2005.  He did not know the specifics of his sleep apnea but the fact that he had a split-night suggest that his sleep apnea was at least moderate in intensity with an AHI greater than 15 within the first 2 hours of his sleep evaluation.  He tells me he used CPAP religiously from 2005 through 2011.  He had a 3-year gap where he was not using therapy.  He commenced therapy again in 2014 and for the past 6 years has been using it religiously.  He states he typically sleeps 6 to 7 hours per night.  He admits to 100% compliance.  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  Recently they have been ordering supplies on [employer].  According to his wife, he is sleeping well.  However times she notes some breakthrough snoring.  He does not know his pressure settings.  His machine is beginning to malfunction.  He has been using nasal pillow mask with chinstrap.  Remotely he had used a full facemask but did not like this.  I calculated Epworth sleepiness scale score and this endorsed at 8 argue against significant residual daytime sleepiness.  He presents for initial sleep evaluation with me and in particular that he is in need for a new machine.  He recently underwent a Bentall procedure by Dr. Caffie Pinto for a sending aortic aneurysm and aortic valve replacement.  He has had a PSVT.  I reviewed the 6-lead ECG from April 05, 2019 which shows  sinus rhythm at 71 bpm without ectopy.   Prior CV studies:   The following studies were reviewed today:  I reviewed the records of Dr. Percival Spanish, and Dr. Cyndia Bent.  Past Medical History:  Diagnosis Date  . Aortic stenosis, severe   . Arthritis   . Ascending aortic aneurysm ([place])   . GERD (gastroesophageal reflux disease)   . Headache   . History of heart murmur in childhood   . History of kidney stones   . Hyperthyroidism   . Impaired glucose tolerance   . Nephrolithiasis   . Sleep apnea    On CPAP   Past Surgical History:  Procedure Laterality Date  . BENTALL PROCEDURE N/A 12/22/2018   Procedure: BENTALL PROCEDURE;  Surgeon: Gaye Pollack, MD;  [facility]: [facility];  Service: Open Heart Surgery;  Laterality: N/A;  NEEDS BILATERAL RADIAL ARTERIAL LINES  . CARDIAC CATHETERIZATION  1990s  . CYSTOSCOPY  1990s   With stone extraction  . CYSTOSCOPY W/ URETERAL STENT PLACEMENT Left 12/02/2015   Procedure: CYSTOSCOPY WITH RETROGRADE PYELOGRAM/URETERAL STENT PLACEMENT;  Surgeon: Cleon Gustin, MD;  Location: [facility] ORS;  Service: Urology;  Laterality: Left;  . ESOPHAGOGASTRODUODENOSCOPY  1990s  . KNEE ARTHROSCOPY Left 2005  . LITHOTRIPSY  1990s  . LITHOTRIPSY Left 11/2015  . REPLACEMENT ASCENDING AORTA N/A 12/22/2018   Procedure: REPLACEMENT ASCENDING AORTA;  Surgeon: Gaye Pollack, MD;  Location: [place];  Service: Open Heart Surgery;  Laterality: N/A;  . RIGHT/LEFT HEART CATH AND CORONARY ANGIOGRAPHY N/A 12/09/2018   Procedure: RIGHT/LEFT HEART CATH AND CORONARY ANGIOGRAPHY;  Surgeon: Jettie Booze, MD;  Location: [place] CV LAB;  Service: Cardiovascular;  Laterality: N/A;  . TEE WITHOUT CARDIOVERSION N/A 12/22/2018   Procedure: TRANSESOPHAGEAL ECHOCARDIOGRAM (TEE);  Surgeon: Gaye Pollack, MD;  Location: [place];  Service: Open Heart Surgery;  Laterality: N/A;  . ULTRASOUND GUIDANCE FOR VASCULAR ACCESS  12/09/2018   Procedure: Ultrasound Guidance For Vascular Access;   Surgeon: Jettie Booze, MD;  Location: [place] CV LAB;  Service: Cardiovascular;;  . UMBILICAL HERNIA REPAIR  late 1990s     Current Meds  Medication Sig  . acetaminophen (TYLENOL) 500 MG tablet Take 1,000 mg by mouth every 6 (six) hours as needed (pain).  Marland Kitchen aspirin EC 325 MG EC tablet Take 1 tablet (325 mg total) by mouth daily.  Marland Kitchen docusate sodium (COLACE) 100 MG capsule Take 100 mg by mouth daily.  . metoprolol succinate (TOPROL XL) 25 MG 24 hr tablet Take 1 tablet (25 mg total) by mouth daily.  . promethazine (PHENERGAN) 25 MG tablet Take 25 mg by mouth every 6 (six) hours as needed for nausea or vomiting.  . tamsulosin (FLOMAX) 0.4 MG CAPS capsule Take 1 capsule (0.4 mg total) by mouth daily after supper.  . traMADol (ULTRAM) 50 MG tablet Take 50 mg by mouth as needed.     Allergies:   Tramadol hcl; Demerol [meperidine]; and Stadol [butorphanol]   Social  History   Tobacco Use  . Smoking status: Passive Smoke Exposure - Never Smoker  . Smokeless tobacco: Never Used  Substance Use Topics  . Alcohol use: Yes    Comment: Socially  . Drug use: Never    He had retired from the [place] office.  He is now Freight forwarder at [employer].  Family Hx: The patient's family history includes Breast cancer in his maternal grandmother; Cancer in his father; Depression in his mother; Heart disease in his maternal grandfather; Hyperlipidemia in his mother; Kidney disease in his father.  ROS:   Please see the history of present illness.    Positive for aortic stenosis, aortic aneurysm, AVR, struct of sleep apnea, PSVT. All other systems reviewed and are negative.   Labs/Other Tests and Data Reviewed:    Recent Labs: 12/19/2018: ALT 22 12/23/2018: Magnesium 2.3 12/27/2018: BUN 20; Creatinine, Ser 0.86; Hemoglobin 9.1; Platelets 190; Potassium 3.7; Sodium 139   Recent Lipid Panel No results found for: CHOL, TRIG, HDL, CHOLHDL, LDLCALC, LDLDIRECT  Wt Readings from Last 3 Encounters:   04/07/19 173 lb (78.5 kg)  01/28/19 173 lb (78.5 kg)  01/19/19 174 lb (78.9 kg)     ECG from 04/05/2019: NSR at 71 - personally reviewed  Exam:    Vital Signs:  BP 118/76   Pulse 70   Ht '5\' 6"'  (1.676 m)   Wt 173 lb (78.5 kg)   SpO2 98%   BMI 27.92 kg/m    Well nourished, well developed male in no acute distress. He appears normocephalic and atraumatic. I visually looked in the back of his pharynx with his video camera up to his face.  Mallinpatti scale is at least a 3. He did not appear to have any neck vein distention's.  Respirations were normal and not labored.   He did not have any tenderness over his chest.   There was no audible wheezing. He was not aware of any abnormal heart rhythms or palpitations  He denied any abdominal discomfort.   He denied any significant swelling.   He appeared neurologically intact.   He had normal cognition and affect  ASSESSMENT & PLAN:    1.  Sleep Apnea: Originally diagnosed in 2005 and he has been using a ResMed Elite 8 CPAP unit since then.  His machine is 61 years old and is beginning to malfunction.  From 2014 until presently, he has been using CPAP with 100% compliance.  He states presently he is averaging 6 to 7 hours of sleep per night.  There is some mild breakthrough snoring.  He denies any significant nocturia.  There is no daytime sleepiness.  With therapy his sleep is fairly restorative.  He qualifies for a new machine.  I discussed options with him and particularly with the COVID-19 pandemic and inability to obtain studies, I feel with his compliance I will prescribe a new ResMed air sense 10 auto CPAP unit with a initial pressure settings minimum of 8 up to a maximum of 20.  I will also prescribe a ResMed air fit N 30 i mask with chinstrap.  I discussed with him the significant improvement in CPAP technology is as well as mask technology.  We will set him up with a new DME company depending upon his insurance.  During the visit I  again reviewed the adverse consequences of untreated sleep apnea with reference to his cardiovascular health.  I answered all his questions.  Hopefully he will receive his new machine in the very  near future which will be wireless and we will be able to adjust pressure requirements as needed.  I will plan to see him hopefully in a face-to-face setting in the office within 90 days of his new CPAP unit.  2. HTN: Blood pressure today well controlled on current therapy with Toprol-XL 25 mg.  3. S/p Bental  Procedure/ AVR; surgery December 22, 2018.  4. PSVT: No further arrhythmia Toprol.  Maintaining sinus rhythm.  COVID-19 Education: The signs and symptoms of COVID-19 were discussed with the patient and how to seek care for testing (follow up with PCP or arrange E-visit). The importance of social distancing was discussed today.  Patient Risk:   After full review of this patients clinical status, I feel that they are at least moderate risk at this time.  Time:   Today, I have spent 30 minutes with the patient with telehealth technology .     Medication Adjustments/Labs and Tests Ordered: Current medicines are reviewed at length with the patient today.  Concerns regarding medicines are outlined above.  Tests Ordered: No orders of the defined types were placed in this encounter.  Medication Changes: No orders of the defined types were placed in this encounter.   Disposition:  2-3 months  Signed, Shelva Majestic, MD  04/07/2019 8:18 AM    [place]

## 2019-04-07 NOTE — Patient Instructions (Signed)
Medication Instructions:  The current medical regimen is effective;  continue present plan and medications.  If you need a refill on your cardiac medications before your next appointment, please call your pharmacy.   Follow-Up: At [facility], you and your health needs are our priority.  As part of our continuing mission to provide you with exceptional heart care, we have created designated Provider Care Teams.  These Care Teams include your primary Cardiologist (physician) and Advanced Practice Providers (APPs -  Physician Assistants and Nurse Practitioners) who all work together to provide you with the care you need, when you need it. You will need a follow up appointment in 3 months after starting new treatment.   You may see Dr.Kelly (SLEEP) or one of the following Advanced Practice Providers on your designated Care Team: Azalee Course, [place] . Micah Flesher, PA-C  Any Other Special Instructions Will Be Listed Below (If Applicable). We will get your mask ordered.

## 2019-04-15 ENCOUNTER — Telehealth: Payer: Self-pay | Admitting: *Deleted

## 2019-04-15 NOTE — Telephone Encounter (Signed)
-----  Message from Julie Thompson, LPN sent at 04/07/2019  9:18 AM EDT ----- Regarding: [place]- sleep Per Dr.Kelly.  He would like to start patient on new machine- ResMed Airsense 10 auto. 8-20 pressure New mask- ResMed N30i chinstrap   He would also like to see about getting his DME company to be [employer] - as patient is currently ordering his from online.   He would like to expedite this request as patients insurance switches June 30th, and they have met there deductible.    

## 2019-04-15 NOTE — Telephone Encounter (Signed)
Orders for ResMed Airsense 10 Auto CPAP machine with ResMed N30-I mask and chinstrap faxed to [facility].

## 2019-04-20 NOTE — Telephone Encounter (Signed)
° °  Patient calling, states his insurance is out of network with [facility]. He has purchased cpap with another vendor and has questions. Please call

## 2019-04-24 NOTE — Telephone Encounter (Signed)
Returned a call and s/w patient's wife. They just wanted to make sure Dr Tresa Endo knows he purchased his CPAP machine from online vendor. They are out in a area where the Internet reception is not good so they will not be utilizing the remote access. It was recommended that the patient call to make a sleep appointment within 30 -60 days. She voiced understanding and will have the patient to call back to schedule.

## 2019-06-15 ENCOUNTER — Telehealth: Payer: Self-pay | Admitting: Cardiology

## 2019-06-15 NOTE — Telephone Encounter (Signed)
Video visit/[phone]/my chart/pre reg complete/consent obtained -- ttf

## 2019-06-21 NOTE — Progress Notes (Deleted)
Virtual Visit via Video Note   This visit type was conducted due to national recommendations for restrictions regarding the COVID-19 Pandemic (e.g. social distancing) in an effort to limit this patient's exposure and mitigate transmission in our community.  Due to his co-morbid illnesses, this patient is at least at moderate risk for complications without adequate follow up.  This format is felt to be most appropriate for this patient at this time.  All issues noted in this document were discussed and addressed.  A limited physical exam was performed with this format.  Please refer to the patient's chart for his consent to telehealth for [facility].   Date:  06/22/2019   ID:  Cameron Spenceharles R Roscher, DOB 25-Jan-1956, MRN [id number]  Patient Location: Home Provider Location: Home  PCP:  Kari BaarsHawkins, Edward, MD  Cardiologist:  No primary care provider on file.  Electrophysiologist:  None   Evaluation Performed:  Follow-Up Visit  Chief Complaint:  AVR  History of Present Illness:    Cameron [name] is a 63 y.o. 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     Since I last saw him he has done well.  He works full time.  The patient denies any new symptoms such as chest discomfort, neck or arm discomfort. There has been no new shortness of breath, PND or orthopnea. There have been no reported palpitations, presyncope or syncope.  The patient does not have symptoms concerning for COVID-19 infection (fever, chills, cough, or new shortness of breath).    Past Medical History:  Diagnosis Date  . Aortic stenosis, severe   . Arthritis   . Ascending aortic aneurysm ([facility])   . GERD (gastroesophageal reflux disease)   . Headache   . History of heart murmur in childhood   . History of kidney stones   . Hyperthyroidism   . Impaired glucose tolerance   . Nephrolithiasis   . Sleep apnea    On CPAP   Past Surgical History:  Procedure  Laterality Date  . BENTALL PROCEDURE N/A 12/22/2018   Procedure: BENTALL PROCEDURE;  Surgeon: Alleen BorneBartle, Bryan K, MD;  [facility];  Service: Open Heart Surgery;  Laterality: N/A;  NEEDS BILATERAL RADIAL ARTERIAL LINES  . CARDIAC CATHETERIZATION  1990s  . CYSTOSCOPY  1990s   With stone extraction  . CYSTOSCOPY W/ URETERAL STENT PLACEMENT Left 12/02/2015   Procedure: CYSTOSCOPY WITH RETROGRADE PYELOGRAM/URETERAL STENT PLACEMENT;  Surgeon: Malen GauzePatrick L McKenzie, MD;  Location: [facility];  Service: Urology;  Laterality: Left;  . ESOPHAGOGASTRODUODENOSCOPY  1990s  . KNEE ARTHROSCOPY Left 2005  . LITHOTRIPSY  1990s  . LITHOTRIPSY Left 11/2015  . REPLACEMENT ASCENDING AORTA N/A 12/22/2018   Procedure: REPLACEMENT ASCENDING AORTA;  Surgeon: Alleen BorneBartle, Bryan K, MD;  Location: [facility] OR;  Service: Open Heart Surgery;  Laterality: N/A;  . RIGHT/LEFT HEART CATH AND CORONARY ANGIOGRAPHY N/A 12/09/2018   Procedure: RIGHT/LEFT HEART CATH AND CORONARY ANGIOGRAPHY;  Surgeon: Corky CraftsVaranasi, Jayadeep S, MD;  [facility];  Service: Cardiovascular;  Laterality: N/A;  . TEE WITHOUT CARDIOVERSION N/A 12/22/2018   Procedure: TRANSESOPHAGEAL ECHOCARDIOGRAM (TEE);  Surgeon: Alleen BorneBartle, Bryan K, MD;  [facility];  Service: Open Heart Surgery;  Laterality: N/A;  . ULTRASOUND GUIDANCE FOR VASCULAR ACCESS  12/09/2018   Procedure: Ultrasound Guidance For Vascular Access;  Surgeon: Corky CraftsVaranasi, Jayadeep S, MD;  [facility];  Service: Cardiovascular;;  . UMBILICAL HERNIA REPAIR  late 1990s  Current Meds  Medication Sig  . acetaminophen (TYLENOL) 500 MG tablet Take 1,000 mg by mouth every 6 (six) hours as needed (pain).  Marland Kitchen aspirin EC 325 MG EC tablet Take 1 tablet (325 mg total) by mouth daily.  Marland Kitchen docusate sodium (COLACE) 100 MG capsule Take 100 mg by mouth daily.  . metoprolol succinate (TOPROL XL) 25 MG 24 hr tablet Take 1 tablet (25 mg total) by mouth daily.  . promethazine (PHENERGAN) 25 MG tablet  Take 25 mg by mouth every 6 (six) hours as needed for nausea or vomiting.  . tamsulosin (FLOMAX) 0.4 MG CAPS capsule Take 1 capsule (0.4 mg total) by mouth daily after supper.  . traMADol (ULTRAM) 50 MG tablet Take 50 mg by mouth as needed.     Allergies:   Tramadol hcl, Demerol [meperidine], and Stadol [butorphanol]   Social History   Tobacco Use  . Smoking status: Passive Smoke Exposure - Never Smoker  . Smokeless tobacco: Never Used  Substance Use Topics  . Alcohol use: Yes    Comment: Socially  . Drug use: Never     Family Hx: The patient's family history includes Breast cancer in his maternal grandmother; Cancer in his father; Depression in his mother; Heart disease in his maternal grandfather; Hyperlipidemia in his mother; Kidney disease in his father.  ROS:   Please see the history of present illness.    As stated in the HPI and negative for all other systems.   Prior CV studies:   The following studies were reviewed today:    Labs/Other Tests and Data Reviewed:    EKG:  No ECG reviewed.  Recent Labs: 12/19/2018: ALT 22 12/23/2018: Magnesium 2.3 12/27/2018: BUN 20; Creatinine, Ser 0.86; Hemoglobin 9.1; Platelets 190; Potassium 3.7; Sodium 139   Recent Lipid Panel No results found for: CHOL, TRIG, HDL, CHOLHDL, LDLCALC, LDLDIRECT  Wt Readings from Last 3 Encounters:  06/22/19 172 lb (78 kg)  04/07/19 173 lb (78.5 kg)  01/28/19 173 lb (78.5 kg)     Objective:    Vital Signs:  BP 118/72   Pulse 89   Temp (!) 96.8 F (36 C)   Ht 5\' 6"  (1.676 m)   Wt 172 lb (78 kg)   SpO2 98%   BMI 27.76 kg/m    VITAL SIGNS:  reviewed GEN:  no acute distress EYES:  sclerae anicteric, EOMI - Extraocular Movements Intact NEURO:  alert and oriented x 3, no obvious focal deficit PSYCH:  normal affect  ASSESSMENT & PLAN:    AVR:     He has done well.  I will reduce the ASA to 81 mg.  He can start riding his motorcycle.  He has follow up with a CT and Dr. Cyndia Bent in Dec.     HTN:    The blood pressure is at target. No change in medications is indicated. We will continue with therapeutic lifestyle changes (TLC).  PSVT:   He has had no arrhythmias.  No change in therapy.    SLEEP APNEA:  He is now having this adequately managed.  No change in therapy.    COVID-19 Education: The signs and symptoms of COVID-19 were discussed with the patient and how to seek care for testing (follow up with PCP or arrange E-visit).  The importance of social distancing was discussed today.  Time:   Today, I have spent 16 minutes with the patient with telehealth technology discussing the above problems.     Medication Adjustments/Labs and  Tests Ordered: Current medicines are reviewed at length with the patient today.  Concerns regarding medicines are outlined above.   Tests Ordered: No orders of the defined types were placed in this encounter.   Medication Changes: No orders of the defined types were placed in this encounter.   Follow Up:  In Person in June of next year  Signed, Rollene RotundaJames Tracye Szuch, MD  06/22/2019 12:02 PM    [facility]

## 2019-06-22 ENCOUNTER — Encounter: Payer: Self-pay | Admitting: *Deleted

## 2019-06-22 ENCOUNTER — Telehealth (INDEPENDENT_AMBULATORY_CARE_PROVIDER_SITE_OTHER): Payer: Commercial Managed Care - PPO | Admitting: Cardiology

## 2019-06-22 ENCOUNTER — Encounter: Payer: Self-pay | Admitting: Cardiology

## 2019-06-22 VITALS — BP 118/72 | HR 89 | Temp 96.8°F | Ht 66.0 in | Wt 172.0 lb

## 2019-06-22 DIAGNOSIS — Z8679 Personal history of other diseases of the circulatory system: Secondary | ICD-10-CM

## 2019-06-22 DIAGNOSIS — Z952 Presence of prosthetic heart valve: Secondary | ICD-10-CM

## 2019-06-22 DIAGNOSIS — Z7189 Other specified counseling: Secondary | ICD-10-CM

## 2019-06-22 NOTE — Patient Instructions (Signed)
Medication Instructions:  DECREASE YOUR ASPIRIN TO 70 MG DAILY  If you need a refill on your cardiac medications before your next appointment, please call your pharmacy.   Lab work: NONE  Testing/Procedures: NONE  Follow-Up: At [facility], you and your health needs are our priority.  As part of our continuing mission to provide you with exceptional heart care, we have created designated Provider Care Teams.  These Care Teams include your primary Cardiologist (physician) and Advanced Practice Providers (APPs -  Physician Assistants and Nurse Practitioners) who all work together to provide you with the care you need, when you need it. You will need a follow up appointment in 12 months.  Please call our office 2 months in advance to schedule this appointment.  You may see DR [facility] or one of the following Advanced Practice Providers on your designated Care Team:   Rosaria Ferries, PA-C . Jory Sims, DNP, ANP

## 2019-06-23 NOTE — Telephone Encounter (Signed)
Opened in error

## 2020-01-04 ENCOUNTER — Other Ambulatory Visit: Payer: Self-pay | Admitting: *Deleted

## 2020-01-04 DIAGNOSIS — I712 Thoracic aortic aneurysm, without rupture, unspecified: Secondary | ICD-10-CM

## 2020-02-03 ENCOUNTER — Other Ambulatory Visit: Payer: Commercial Managed Care - PPO

## 2020-02-03 ENCOUNTER — Encounter: Payer: Commercial Managed Care - PPO | Admitting: Surgery

## 2020-02-17 ENCOUNTER — Ambulatory Visit
Admission: RE | Admit: 2020-02-17 | Discharge: 2020-02-17 | Disposition: A | Discharge status: Home / Self Care | Payer: Commercial Managed Care - PPO | Source: Ambulatory Visit | Attending: Surgery | Admitting: Surgery

## 2020-02-17 ENCOUNTER — Other Ambulatory Visit: Payer: Self-pay

## 2020-02-17 ENCOUNTER — Encounter: Payer: Self-pay | Admitting: Surgery

## 2020-02-17 ENCOUNTER — Ambulatory Visit (INDEPENDENT_AMBULATORY_CARE_PROVIDER_SITE_OTHER): Payer: Commercial Managed Care - PPO | Admitting: Surgery

## 2020-02-17 VITALS — BP 164/91 | HR 68 | Temp 98.2°F | Resp 20 | Ht 65.0 in | Wt 186.0 lb

## 2020-02-17 DIAGNOSIS — Z952 Presence of prosthetic heart valve: Secondary | ICD-10-CM

## 2020-02-17 DIAGNOSIS — I712 Thoracic aortic aneurysm, without rupture, unspecified: Secondary | ICD-10-CM

## 2020-02-17 MED ORDER — IOPAMIDOL (ISOVUE-370) INJECTION 76%
75.0000 mL | Freq: Once | INTRAVENOUS | Status: AC | PRN
  Administered 2020-02-17: 75 mL via INTRAVENOUS

## 2020-02-18 NOTE — Progress Notes (Signed)
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  He has been feeling well overall and is walking without chest pain or shortness of breath.  Current Outpatient Medications  Medication Sig Dispense Refill  . acetaminophen (TYLENOL) 500 MG tablet Take 1,000 mg by mouth every 6 (six) hours as needed (pain).    Marland Kitchen aspirin EC 81 MG tablet Take 81 mg by mouth daily.    Marland Kitchen docusate sodium (COLACE) 100 MG capsule Take 100 mg by mouth daily.    . metoprolol succinate (TOPROL XL) 25 MG 24 hr tablet Take 1 tablet (25 mg total) by mouth daily. 90 tablet 3  . promethazine (PHENERGAN) 25 MG tablet Take 25 mg by mouth every 6 (six) hours as needed for nausea or vomiting.    . tamsulosin (FLOMAX) 0.4 MG CAPS capsule Take 1 capsule (0.4 mg total) by mouth daily after supper. 30 capsule 0  . traMADol (ULTRAM) 50 MG tablet Take 50 mg by mouth as needed.     No current facility-administered medications for this visit.     Physical Exam: BP (!) 164/91 (BP Location: Left Arm)   Pulse 68   Temp 98.2 F (36.8 C) (Oral)   Resp 20   Ht 5\' 5"  (1.651 m)   Wt 186 lb (84.4 kg)   SpO2 95% Comment: RA  BMI 30.95 kg/m  He looks well. Cardiac exam shows a regular rate and rhythm with normal heart sounds.  There is no murmur. Lungs are clear. The chest is well-healed and the sternum is stable. There is mild bilateral ankle edema.  Diagnostic Tests:  CLINICAL DATA:  Thoracic aortic aneurysm.  EXAM: CT ANGIOGRAPHY CHEST WITH CONTRAST  TECHNIQUE: Multidetector CT imaging of the chest was performed using the standard protocol during bolus administration of intravenous contrast. Multiplanar CT image reconstructions and MIPs were obtained to evaluate the vascular anatomy.  CONTRAST:  54mL ISOVUE-370 IOPAMIDOL (ISOVUE-370) INJECTION 76%  COMPARISON:  October 16, 2018.  FINDINGS: Cardiovascular: Status post surgical repair of ascending thoracic aortic aneurysm as well as aortic valve replacement. No aneurysm or dissection is noted currently. Proximal descending thoracic aorta measures 3.7 cm in diameter. Distal descending thoracic aorta measures 2.6 cm. No pericardial effusion is noted. Stable cardiomegaly.  Mediastinum/Nodes: No enlarged mediastinal, hilar, or axillary lymph nodes. Thyroid gland, trachea, and esophagus demonstrate no significant findings.  Lungs/Pleura: Lungs are clear. No pleural effusion or pneumothorax.  Upper Abdomen: No acute abnormality.  Musculoskeletal: No chest wall abnormality. No acute or significant osseous findings.  Review of the MIP images confirms the above findings.  IMPRESSION: Status post surgical repair of ascending thoracic aortic aneurysm as well as aortic valve replacement. No aneurysm or dissection is noted currently.   Electronically Signed   By: Marijo Conception M.D.   On: 02/17/2020 15:47   Impression:  He is doing well 14 months following his surgery.  His follow-up CTA of the chest shows stable surgical repair of his ascending thoracic aortic aneurysm and the remainder of his thoracic aorta has not changed in size.  He has mild enlargement of the proximal descending thoracic aorta at 3.7 cm but I do not think this requires long-term follow-up with repeated CT scans.  I reviewed the CTA images with the patient and his wife and answered all their questions.  I think he can follow-up with his  PCP and cardiology.  Plan:  He will continue to follow-up with his PCP and cardiology.  I will be happy to see him back if the need arises.  I spent 15 minutes performing this established patient evaluation and > 50% of this time was spent face to face counseling and coordinating the surveillance of this patient's resected aortic aneurysm.    Alleen Borne, MD [facility] [phone]

## 2020-06-13 DIAGNOSIS — Z7189 Other specified counseling: Secondary | ICD-10-CM | POA: Insufficient documentation

## 2020-06-13 DIAGNOSIS — G4730 Sleep apnea, unspecified: Secondary | ICD-10-CM | POA: Insufficient documentation

## 2020-06-13 DIAGNOSIS — I10 Essential (primary) hypertension: Secondary | ICD-10-CM | POA: Insufficient documentation

## 2020-06-13 DIAGNOSIS — I471 Supraventricular tachycardia: Secondary | ICD-10-CM | POA: Insufficient documentation

## 2020-06-13 NOTE — Progress Notes (Signed)
Cardiology Office Note   Date:  06/14/2020   ID:  Cameron [name], DOB Jul 19, 1956, MRN [id number]  PCP:  No primary care provider on file.  Cardiologist:   Minus Breeding, MD   Chief Complaint  Patient presents with  . AVR      History of Present Illness: Cameron [name] is a 64 y.o. 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     Since I last saw him he has done well.  He is working at a [employer].  He is mostly retired from [employer].  He just had a CT of his chest and had stable ascending thoracic aortic aneurysm repair.  The patient denies any new symptoms such as chest discomfort, neck or arm discomfort. There has been no new shortness of breath, PND or orthopnea. There have been no reported palpitations, presyncope or syncope.    Past Medical History:  Diagnosis Date  . Aortic stenosis, severe   . Arthritis   . Ascending aortic aneurysm ([place])   . GERD (gastroesophageal reflux disease)   . Headache   . History of heart murmur in childhood   . History of kidney stones   . Hyperthyroidism   . Impaired glucose tolerance   . Nephrolithiasis   . Sleep apnea    On CPAP    Past Surgical History:  Procedure Laterality Date  . BENTALL PROCEDURE N/A 12/22/2018   Procedure: BENTALL PROCEDURE;  Surgeon: Gaye Pollack, MD;  [facility]: [facility];  Service: Open Heart Surgery;  Laterality: N/A;  NEEDS BILATERAL RADIAL ARTERIAL LINES  . CARDIAC CATHETERIZATION  1990s  . CYSTOSCOPY  1990s   With stone extraction  . CYSTOSCOPY W/ URETERAL STENT PLACEMENT Left 12/02/2015   Procedure: CYSTOSCOPY WITH RETROGRADE PYELOGRAM/URETERAL STENT PLACEMENT;  Surgeon: Cleon Gustin, MD;  Location: [facility] ORS;  Service: Urology;  Laterality: Left;  . ESOPHAGOGASTRODUODENOSCOPY  1990s  . KNEE ARTHROSCOPY Left 2005  . LITHOTRIPSY  1990s  . LITHOTRIPSY Left 11/2015  . REPLACEMENT ASCENDING AORTA N/A 12/22/2018    Procedure: REPLACEMENT ASCENDING AORTA;  Surgeon: Gaye Pollack, MD;  Location: [facility];  Service: Open Heart Surgery;  Laterality: N/A;  . RIGHT/LEFT HEART CATH AND CORONARY ANGIOGRAPHY N/A 12/09/2018   Procedure: RIGHT/LEFT HEART CATH AND CORONARY ANGIOGRAPHY;  Surgeon: Jettie Booze, MD;  Location: [facility];  Service: Cardiovascular;  Laterality: N/A;  . TEE WITHOUT CARDIOVERSION N/A 12/22/2018   Procedure: TRANSESOPHAGEAL ECHOCARDIOGRAM (TEE);  Surgeon: Gaye Pollack, MD;  Location: [facility];  Service: Open Heart Surgery;  Laterality: N/A;  . ULTRASOUND GUIDANCE FOR VASCULAR ACCESS  12/09/2018   Procedure: Ultrasound Guidance For Vascular Access;  Surgeon: Jettie Booze, MD;  Location: [facility];  Service: Cardiovascular;;  . UMBILICAL HERNIA REPAIR  late 1990s     Current Outpatient Medications  Medication Sig Dispense Refill  . acetaminophen (TYLENOL) 500 MG tablet Take 1,000 mg by mouth every 6 (six) hours as needed (pain).    Marland Kitchen aspirin EC 81 MG tablet Take 81 mg by mouth daily.    Marland Kitchen docusate sodium (COLACE) 100 MG capsule Take 100 mg by mouth daily.    . metoprolol succinate (TOPROL XL) 25 MG 24 hr tablet Take 1 tablet (25 mg total) by mouth daily. 90 tablet 3  . promethazine (PHENERGAN) 25 MG tablet Take 25 mg by mouth every 6 (six) hours as needed  for nausea or vomiting.    . tamsulosin (FLOMAX) 0.4 MG CAPS capsule Take 1 capsule (0.4 mg total) by mouth daily after supper. 30 capsule 0  . traMADol (ULTRAM) 50 MG tablet Take 50 mg by mouth as needed.     No current facility-administered medications for this visit.    Allergies:   Tramadol hcl, Demerol [meperidine], and Stadol [butorphanol]    ROS:  Please see the history of present illness.   Otherwise, review of systems are positive for none.   All other systems are reviewed and negative.    PHYSICAL EXAM: VS:  BP 140/90   Pulse 68   Temp (!) 97.3 F (36.3 C)   Ht 5\' 6"  (1.676 m)   Wt 188  lb 12.8 oz (85.6 kg)   SpO2 96%   BMI 30.47 kg/m  , BMI Body mass index is 30.47 kg/m. GENERAL:  Well appearing NECK:  No jugular venous distention, waveform within normal limits, carotid upstroke brisk and symmetric, no bruits, no thyromegaly LYMPHATICS:  No cervical, inguinal adenopathy LUNGS:  Clear to auscultation bilaterally BACK:  No CVA tenderness CHEST:  Unremarkable HEART:  PMI not displaced or sustained,S1 and S2 within normal limits, no S3, no S4, no clicks, no rubs, 3 out of 6 apical systolic murmur early to mid peaking, no diastolic murmurs ABD:  Flat, positive bowel sounds normal in frequency in pitch, no bruits, no rebound, no guarding, no midline pulsatile mass, no hepatomegaly, no splenomegaly EXT:  2 plus pulses throughout, no edema, no cyanosis no clubbing    EKG:  EKG is ordered today. The ekg ordered today demonstrates sinus bradycardia, rate 55, leftward axis, intervals within normal limits, no acute T wave changes.   Recent Labs: No results found for requested labs within last 8760 hours.    Lipid Panel No results found for: CHOL, TRIG, HDL, CHOLHDL, VLDL, LDLCALC, LDLDIRECT    Wt Readings from Last 3 Encounters:  06/14/20 188 lb 12.8 oz (85.6 kg)  02/17/20 186 lb (84.4 kg)  06/22/19 172 lb (78 kg)      Other studies Reviewed: Additional studies/ records that were reviewed today include: CT. Review of the above records demonstrates:  Please see elsewhere in the note.     ASSESSMENT AND PLAN:  AVR:   The murmur is louder than I recall.  I would like to get a baseline echocardiogram.  Otherwise I think he is doing well.  He understands endocarditis prophylaxis.  No change in therapy.  AORTIC ANEURYSM REPAIR: No further imaging at this time.  HTN:  The blood pressure is mildly elevated but this is unusual.  No change in therapy.  PSVT:    Has had no further arrhythmias.  This was postop atrial fibrillation.  No further work-up.   SLEEP APNEA:    He is up-to-date with a new machine.   COVID EDUCATION: He has been vaccinated.   Current medicines are reviewed at length with the patient today.  The patient does not have concerns regarding medicines.  The following changes have been made:  no change  Labs/ tests ordered today include: None  Orders Placed This Encounter  Procedures  . EKG 12-Lead  . ECHOCARDIOGRAM COMPLETE     Disposition:   FU with me in one year.     Signed, 06/24/19, MD  06/14/2020 5:15 PM    [facility]

## 2020-06-14 ENCOUNTER — Encounter: Payer: Self-pay | Admitting: Cardiology

## 2020-06-14 ENCOUNTER — Other Ambulatory Visit: Payer: Self-pay

## 2020-06-14 ENCOUNTER — Ambulatory Visit (INDEPENDENT_AMBULATORY_CARE_PROVIDER_SITE_OTHER): Payer: Commercial Managed Care - PPO | Admitting: Cardiology

## 2020-06-14 VITALS — BP 140/90 | HR 68 | Temp 97.3°F | Ht 66.0 in | Wt 188.8 lb

## 2020-06-14 DIAGNOSIS — I471 Supraventricular tachycardia, unspecified: Secondary | ICD-10-CM

## 2020-06-14 DIAGNOSIS — Z952 Presence of prosthetic heart valve: Secondary | ICD-10-CM | POA: Diagnosis not present

## 2020-06-14 DIAGNOSIS — Z7189 Other specified counseling: Secondary | ICD-10-CM

## 2020-06-14 DIAGNOSIS — G4730 Sleep apnea, unspecified: Secondary | ICD-10-CM | POA: Diagnosis not present

## 2020-06-14 DIAGNOSIS — I10 Essential (primary) hypertension: Secondary | ICD-10-CM

## 2020-06-14 NOTE — Patient Instructions (Addendum)
Medication Instructions:  Your physician recommends that you continue on your current medications as directed. Please refer to the Current Medication list given to you today.  *If you need a refill on your cardiac medications before your next appointment, please call your pharmacy*  Lab Work: NONE  Testing/Procedures: Your physician has requested that you have an echocardiogram. Echocardiography is a painless test that uses sound waves to create images of your heart. It provides your doctor with information about the size and shape of your heart and how well your heart's chambers and valves are working. This procedure takes approximately one hour. There are no restrictions for this procedure. AT [place]  Follow-Up: At [facility], you and your health needs are our priority.  As part of our continuing mission to provide you with exceptional heart care, we have created designated Provider Care Teams.  These Care Teams include your primary Cardiologist (physician) and Advanced Practice Providers (APPs -  Physician Assistants and Nurse Practitioners) who all work together to provide you with the care you need, when you need it.  We recommend signing up for the patient portal called "MyChart".  Sign up information is provided on this After Visit Summary.  MyChart is used to connect with patients for Virtual Visits (Telemedicine).  Patients are able to view lab/test results, encounter notes, upcoming appointments, etc.  Non-urgent messages can be sent to your provider as well.   To learn more about what you can do with MyChart, go to [email or web address].    Your next appointment:   12 month(s)  You will receive a reminder letter in the mail two months in advance. If you don't receive a letter, please call our office to schedule the follow-up appointment.   The format for your next appointment:   In Person  Provider:   You may see Rollene Rotunda, MD or one of the following Advanced  Practice Providers on your designated Care Team:    Theodore Demark, PA-C  Joni Reining, DNP, ANP  Cadence Fransico Michael, NP

## 2020-06-15 ENCOUNTER — Telehealth: Payer: Self-pay | Admitting: *Deleted

## 2020-06-15 NOTE — Telephone Encounter (Signed)
Patient aware for Echo appointment for Friday 06/17/20 at 3:15 pm at [facility]---arrival time is 2:45 pm--patient to use Entrance "C".

## 2020-06-17 ENCOUNTER — Other Ambulatory Visit: Payer: Self-pay

## 2020-06-17 ENCOUNTER — Ambulatory Visit (HOSPITAL_COMMUNITY)
Admission: RE | Admit: 2020-06-17 | Discharge: 2020-06-17 | Disposition: A | Discharge status: Home / Self Care | Payer: Commercial Managed Care - PPO | Source: Ambulatory Visit | Attending: Cardiology | Admitting: Cardiology

## 2020-06-17 DIAGNOSIS — Z953 Presence of xenogenic heart valve: Secondary | ICD-10-CM | POA: Diagnosis present

## 2020-06-17 DIAGNOSIS — Z952 Presence of prosthetic heart valve: Secondary | ICD-10-CM

## 2020-06-17 DIAGNOSIS — I10 Essential (primary) hypertension: Secondary | ICD-10-CM | POA: Insufficient documentation

## 2020-06-17 DIAGNOSIS — I340 Nonrheumatic mitral (valve) insufficiency: Secondary | ICD-10-CM | POA: Diagnosis not present

## 2020-06-24 ENCOUNTER — Other Ambulatory Visit: Payer: Self-pay | Admitting: Cardiology

## 2021-04-19 ENCOUNTER — Other Ambulatory Visit: Payer: Self-pay | Admitting: Cardiology

## 2021-05-15 ENCOUNTER — Other Ambulatory Visit: Payer: Self-pay | Admitting: Family Medicine

## 2021-05-15 ENCOUNTER — Other Ambulatory Visit (HOSPITAL_COMMUNITY): Payer: Self-pay | Admitting: Family Medicine

## 2021-05-15 DIAGNOSIS — H53121 Transient visual loss, right eye: Secondary | ICD-10-CM

## 2021-05-15 DIAGNOSIS — G459 Transient cerebral ischemic attack, unspecified: Secondary | ICD-10-CM

## 2021-05-20 ENCOUNTER — Ambulatory Visit (HOSPITAL_COMMUNITY)
Admission: RE | Admit: 2021-05-20 | Discharge: 2021-05-20 | Disposition: A | Discharge status: Home / Self Care | Payer: Commercial Managed Care - PPO | Source: Ambulatory Visit | Attending: Family Medicine | Admitting: Family Medicine

## 2021-05-20 ENCOUNTER — Other Ambulatory Visit: Payer: Self-pay

## 2021-05-20 DIAGNOSIS — G459 Transient cerebral ischemic attack, unspecified: Secondary | ICD-10-CM | POA: Insufficient documentation

## 2021-05-20 DIAGNOSIS — H53121 Transient visual loss, right eye: Secondary | ICD-10-CM | POA: Insufficient documentation

## 2021-05-20 MED ORDER — GADOBUTROL 1 MMOL/ML IV SOLN
8.0000 mL | Freq: Once | INTRAVENOUS | Status: AC | PRN
  Administered 2021-05-20: 8 mL via INTRAVENOUS

## 2021-05-25 ENCOUNTER — Encounter: Payer: Self-pay | Admitting: Neurology

## 2021-05-25 ENCOUNTER — Other Ambulatory Visit: Payer: Self-pay

## 2021-05-25 ENCOUNTER — Ambulatory Visit (INDEPENDENT_AMBULATORY_CARE_PROVIDER_SITE_OTHER): Payer: Commercial Managed Care - PPO | Admitting: Neurology

## 2021-05-25 VITALS — Ht 66.0 in | Wt 187.0 lb

## 2021-05-25 DIAGNOSIS — H53131 Sudden visual loss, right eye: Secondary | ICD-10-CM | POA: Insufficient documentation

## 2021-05-25 DIAGNOSIS — G4489 Other headache syndrome: Secondary | ICD-10-CM | POA: Diagnosis not present

## 2021-05-25 DIAGNOSIS — R9082 White matter disease, unspecified: Secondary | ICD-10-CM | POA: Insufficient documentation

## 2021-05-25 NOTE — Progress Notes (Signed)
[facility]  PATIENT: ADRIEN [name] DOB: [birth year]  REFERRING DOCTOR OR PCP: Leslie Andrea, MD SOURCE: Patient, notes from primary care, imaging and laboratory reports, MRI images personally reviewed.  _________________________________   HISTORICAL  CHIEF COMPLAINT:  Chief Complaint  Patient presents with  . New Patient (Initial Visit)    "Abnormal MRI/MRA" Room 12, wife Cameron [name] in room    HISTORY OF PRESENT ILLNESS:  I had the pleasure of seeing patient, Cameron [name], at [facility] for neurologic consultation regarding his episode of headache with visual change and abnormal brain MRI and MRA.  He is a 65 year old man who had loss of vision on the right and lightheadedness on 05/06/2021 with sudden onset.    Concurrently, he had a left sided headache.    The headache lasted was throbbing and lasted 4-5 hours.    He had mild photophobia but no phonophobia.  No nausea or vomiting.    Pain improved after 4-5 hours but did not resolve for 2 more days.    He feels his git has been slower since the event.    Vision slowly improved and is now close to baseline though still seems a little foggy.     BP has been mildly elevated since th episode.     About 20 years ago he had an episode of amaurosis fugax lasting a couple hours.  An evaluation included a cardiac catheterization.  Arteries were fine but he had MVP.  He had an episode of Afib around 2017.     He had an AVR (for aortic stenosis) and thoracic aortic aneurysm repair in 2019.     He has a bovine valve.  He is not on any anti-coagulants.   He is on metoprolol.    He has a cardiology appt later this month.     He has a history of migraine headaches but none x years.   Migraines used to be associated wit N/V and visual aura, different than symptoms he experienced more recently.  He has OSA and is on CPAP.  His wife takes his blood pressure regularly.  She showed me a log over the past few  weeks.  Blood pressures generally been running mildly high with a typical reading of 130-140/80-90.  MRI of the brain 05/20/2021 shows some scattered T2/FLAIR hyperintense foci in the subcortical and deep white matter.  None of the foci appear to be acute.  They are consistent with chronic microvascular ischemic change.  Brain volume is normal.  No acute findings.  MRA of the head 05/20/2021 is normal.   Incidental note of azygos ACA (normal variant) REVIEW OF SYSTEMS: Constitutional: No fevers, chills, sweats, or change in appetite Eyes: No visual changes, double vision, eye pain Ear, nose and throat: No hearing loss, ear pain, nasal congestion, sore throat Cardiovascular: No chest pain, palpitations Respiratory: No shortness of breath at rest or with exertion.   No wheezes GastrointestinaI: No nausea, vomiting, diarrhea, abdominal pain, fecal incontinence Genitourinary: No dysuria, urinary retention or frequency.  No nocturia. Musculoskeletal: No neck pain, back pain Integumentary: No rash, pruritus, skin lesions Neurological: as above Psychiatric: No depression at this time.  No anxiety Endocrine: No palpitations, diaphoresis, change in appetite, change in weigh or increased thirst Hematologic/Lymphatic: No anemia, purpura, petechiae. Allergic/Immunologic: No itchy/runny eyes, nasal congestion, recent allergic reactions, rashes  ALLERGIES: Allergies  Allergen Reactions  . Tramadol Hcl Itching    Patient tolerates specific NDC generic product - family  is able to bring supply in from home.   . Demerol [Meperidine] Anxiety and Other (See Comments)    Makes pt hyper, the higher the dose the more hyper pt gets  . Stadol [Butorphanol] Anxiety and Other (See Comments)    Hyper- the higher the dose the more hyper pt gets    HOME MEDICATIONS:  Current Outpatient Medications:  .  acetaminophen (TYLENOL) 500 MG tablet, Take 1,000 mg by mouth every 6 (six) hours as needed (pain)., Disp: ,  Rfl:  .  aspirin EC 81 MG tablet, Take 81 mg by mouth daily., Disp: , Rfl:  .  metoprolol succinate (TOPROL-XL) 25 MG 24 hr tablet, TAKE 1 TABLET BY MOUTH ONCE DAILY. (Patient taking differently: in the morning and at bedtime.), Disp: 90 tablet, Rfl: 0 .  promethazine (PHENERGAN) 25 MG tablet, Take 25 mg by mouth every 6 (six) hours as needed for nausea or vomiting., Disp: , Rfl:  .  tamsulosin (FLOMAX) 0.4 MG CAPS capsule, Take 1 capsule (0.4 mg total) by mouth daily after supper., Disp: 30 capsule, Rfl: 0 .  traMADol (ULTRAM) 50 MG tablet, Take 50 mg by mouth as needed., Disp: , Rfl:  .  docusate sodium (COLACE) 100 MG capsule, Take 100 mg by mouth daily., Disp: , Rfl:   PAST MEDICAL HISTORY: Past Medical History:  Diagnosis Date  . Aortic stenosis, severe   . Arthritis   . Ascending aortic aneurysm ([place])   . GERD (gastroesophageal reflux disease)   . Headache   . History of heart murmur in childhood   . History of kidney stones   . Hyperthyroidism   . Impaired glucose tolerance   . Nephrolithiasis   . Sleep apnea    On CPAP    PAST SURGICAL HISTORY: Past Surgical History:  Procedure Laterality Date  . BENTALL PROCEDURE N/A 12/22/2018   Procedure: BENTALL PROCEDURE;  Surgeon: Gaye Pollack, MD;  Location: [facility] OR;  Service: Open Heart Surgery;  Laterality: N/A;  NEEDS BILATERAL RADIAL ARTERIAL LINES  . CARDIAC CATHETERIZATION  1990s  . CYSTOSCOPY  1990s   With stone extraction  . CYSTOSCOPY W/ URETERAL STENT PLACEMENT Left 12/02/2015   Procedure: CYSTOSCOPY WITH RETROGRADE PYELOGRAM/URETERAL STENT PLACEMENT;  Surgeon: Cleon Gustin, MD;  Location: [facility] ORS;  Service: Urology;  Laterality: Left;  . ESOPHAGOGASTRODUODENOSCOPY  1990s  . KNEE ARTHROSCOPY Left 2005  . LITHOTRIPSY  1990s  . LITHOTRIPSY Left 11/2015  . REPLACEMENT ASCENDING AORTA N/A 12/22/2018   Procedure: REPLACEMENT ASCENDING AORTA;  Surgeon: Gaye Pollack, MD;  Location: [place];  Service: Open Heart Surgery;   Laterality: N/A;  . RIGHT/LEFT HEART CATH AND CORONARY ANGIOGRAPHY N/A 12/09/2018   Procedure: RIGHT/LEFT HEART CATH AND CORONARY ANGIOGRAPHY;  Surgeon: Jettie Booze, MD;  Location: [place] [facility] LAB;  Service: Cardiovascular;  Laterality: N/A;  . TEE WITHOUT CARDIOVERSION N/A 12/22/2018   Procedure: TRANSESOPHAGEAL ECHOCARDIOGRAM (TEE);  Surgeon: Gaye Pollack, MD;  Location: [place];  Service: Open Heart Surgery;  Laterality: N/A;  . ULTRASOUND GUIDANCE FOR VASCULAR ACCESS  12/09/2018   Procedure: Ultrasound Guidance For Vascular Access;  Surgeon: Jettie Booze, MD;  Location: [place] [facility];  Service: Cardiovascular;;  . UMBILICAL HERNIA REPAIR  late 1990s    FAMILY HISTORY: Family History  Problem Relation Age of Onset  . Depression Mother   . Hyperlipidemia Mother   . Cancer Father   . Kidney disease Father   . Breast cancer Maternal Grandmother   .  Heart disease Maternal Grandfather     SOCIAL HISTORY:  Social History   Socioeconomic History  . Marital status: Married    Spouse name: Cameron [name]  . Number of children: Not on file  . Years of education: Not on file  . Highest education level: Not on file  Occupational History  . Not on file  Tobacco Use  . Smoking status: Passive Smoke Exposure - Never Smoker  . Smokeless tobacco: Never Used  Vaping Use  . Vaping Use: Never used  Substance and Sexual Activity  . Alcohol use: Yes    Comment: Socially  . Drug use: Never  . Sexual activity: Yes  Other Topics Concern  . Not on file  Social History Narrative  . Not on file   Social Determinants of Health   Financial Resource Strain: Not on file  Food Insecurity: Not on file  Transportation Needs: Not on file  Physical Activity: Not on file  Stress: Not on file  Social Connections: Not on file  Intimate Partner Violence: Not on file     PHYSICAL EXAM  Vitals:   05/25/21 1404  Weight: 187 lb (84.8 kg)  Height: '5\' 6"'  (1.676 m)    Body  mass index is 30.18 kg/m.  VA:  OD  20/30-2; OS 20/20-2  General: The patient is well-developed and well-nourished and in no acute distress.  Pharynx is Mallampati 3  HEENT:  Head is [place]/AT.  Sclera are anicteric.  Funduscopic exam shows normal optic discs and retinal vessels.  Neck: No carotid bruits are noted.  The neck is nontender.  Cardiovascular: The heart has a regular rate and rhythm with a normal S1 and S2. There were no murmurs, gallops or rubs.    Skin: Extremities are without rash or  edema.  Musculoskeletal:  Back is nontender  Neurologic Exam  Mental status: The patient is alert and oriented x 3 at the time of the examination. The patient has apparent normal recent and remote memory, with an apparently normal attention span and concentration ability.   Speech is normal.  Cranial nerves: Extraocular movements are full. Pupils are equal, round, and reactive to light and accomodation. Reduced color vision OD.   Reduced acuity OS.  Facial symmetry is present. There is good facial sensation to soft touch bilaterally.Facial strength is normal.  Trapezius and sternocleidomastoid strength is normal. No dysarthria is noted.  The tongue is midline, and the patient has symmetric elevation of the soft palate. No obvious hearing deficits are noted.  Motor:  Muscle bulk is normal.   Tone is normal. Strength is  5 / 5 in all 4 extremities.   Sensory: Sensory testing is intact to pinprick, soft touch and vibration sensation in all 4 extremities.  Coordination: Cerebellar testing reveals good finger-nose-finger and heel-to-shin bilaterally.  Gait and station: Station is normal.   Gait is normal. Tandem gait is normal. Romberg is negative.   Reflexes: Deep tendon reflexes are symmetric and normal bilaterally.   Plantar responses are flexor.    DIAGNOSTIC DATA (LABS, IMAGING, TESTING) - I reviewed patient records, labs, notes, testing and imaging myself where available.  Lab Results   Component Value Date   WBC 8.9 12/27/2018   HGB 9.1 (L) 12/27/2018   HCT 28.6 (L) 12/27/2018   MCV 93.2 12/27/2018   PLT 190 12/27/2018      Component Value Date/Time   NA 139 12/27/2018 0323   NA 144 12/04/2018 1259   K 3.7 12/27/2018 0323  CL 108 12/27/2018 0323   CO2 23 12/27/2018 0323   GLUCOSE 99 12/27/2018 0323   BUN 20 12/27/2018 0323   BUN 15 12/04/2018 1259   CREATININE 0.86 12/27/2018 0323   CALCIUM 8.0 (L) 12/27/2018 0323   PROT 6.7 12/19/2018 1202   ALBUMIN 4.2 12/19/2018 1202   AST 20 12/19/2018 1202   ALT 22 12/19/2018 1202   ALKPHOS 38 12/19/2018 1202   BILITOT 0.9 12/19/2018 1202   GFRNONAA >60 12/27/2018 0323   GFRAA >60 12/27/2018 0323   No results found for: CHOL, HDL, LDLCALC, LDLDIRECT, TRIG, CHOLHDL Lab Results  Component Value Date   HGBA1C 5.4 12/19/2018       ASSESSMENT AND PLAN  Other headache syndrome  Acute visual loss, right  White matter abnormality on MRI of brain     In summary, Mr. Haubner is a 65 year old man with recent onset of reduced right visual acuity that was followed by left-sided headache.  Headache resolved but he continues to have the visual dysfunction.  MR angiogram was normal.  MRI of the brain showed T2/FLAIR hyperintense foci consistent with mild chronic microvascular ischemic change.  The extent is mildly more than expected for age.  I am most concerned about the possibility of ischemic optic neuropathy.  We will check lab work for ESR, CRP and ANA.  His wife works at [employer] and will have him get the lab work there and then forward the results.  I have also advised him to get an ophthalmologic exam to rule out other possibilities.  He will return to see me in 3 to 4 months or sooner if there are new or worsening neurologic symptoms.  Thank you for asking me to see Mr. Ricard Dillon.  Please let me know if I can be of further assistance with him or other patients in the future.      Malissa Slay A. Felecia Shelling, MD, [facility]  08/01/8915, 9:45 PM Certified in Neurology, Clinical Neurophysiology, Sleep Medicine and Neuroimaging  [facility] [address], [place], [place] [address] [phone]

## 2021-06-01 ENCOUNTER — Other Ambulatory Visit: Payer: Self-pay | Admitting: Neurology

## 2021-06-03 LAB — ANTI-NUCLEAR AB-TITER (ANA TITER): ANA Titer 1: 1:80 {titer} — ABNORMAL HIGH

## 2021-06-03 LAB — ANA: Anti Nuclear Antibody (ANA): POSITIVE — AB

## 2021-06-03 LAB — C-REACTIVE PROTEIN: CRP: 1 mg/L (ref <8.0)

## 2021-06-03 LAB — SEDIMENTATION RATE: Sed Rate: 2 mm/h (ref 0–20)

## 2021-06-09 ENCOUNTER — Ambulatory Visit: Payer: Commercial Managed Care - PPO | Admitting: Cardiology

## 2021-06-12 ENCOUNTER — Encounter: Payer: Self-pay | Admitting: *Deleted

## 2021-06-12 ENCOUNTER — Telehealth: Payer: Self-pay | Admitting: *Deleted

## 2021-06-12 NOTE — Telephone Encounter (Signed)
Called and spoke w/ wife. Relayed Dr. Bonnita Hollow message. She would like orders faxed to her at [phone]. Letter printed, MD signed and faxed back to wife, received fax confirmation.

## 2021-06-12 NOTE — Telephone Encounter (Signed)
-----   Message from Asa Lente, MD sent at 06/12/2021 10:53 AM EDT ----- Please let the patient or wife know --- the one test (ANA) was abnormal.  This test is prone to false positives so this may not mean anything.  However,  I would like to check a couple more detailed tests:  Wife works at [employer] so they need to do there:  The tests are: ENA  [facility] code 7448 Anti-dsDNA  Quest code 255 Antiphospholipid Panel  Quest code 41423   Thank you

## 2021-06-12 NOTE — Telephone Encounter (Signed)
Refaxed w/ dx codes. Received fax confirmation.

## 2021-06-12 NOTE — Progress Notes (Signed)
Cardiology Office Note   Date:  06/13/2021   ID:  Cameron [name], DOB 27-Nov-1956, MRN [id number]  PCP:  [name], MD  Cardiologist:   Rollene Rotunda, MD   Chief Complaint  Patient presents with   AVR       History of Present Illness: Cameron [name] is a 65 y.o. 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    This was functioning normally on echo last year.     Since I last saw him he has been seen by neurology for questionable TIA.  He had loss of vision in his right eye and lightheadedness in mid May.  He had headache.  MRI suggested some chronic microvascular ischemic changes.  There were no significant findings on the MRA.  He has had extensive blood work which demonstrates an abnormal ANA and he is undergoing work-up for this.  He has been noted by his wife that his blood pressure is elevated.  From a cardiac standpoint he is not having any new cardiovascular symptoms.  He is not having any chest pressure, neck or arm discomfort.  He is not having any new palpitations, presyncope or syncope.  He does not describe PND or orthopnea.  He has had no weight gain or edema.    Past Medical History:  Diagnosis Date   Aortic stenosis, severe    Arthritis    Ascending aortic aneurysm ([facility])    GERD (gastroesophageal reflux disease)    Headache    History of heart murmur in childhood    History of kidney stones    Hyperthyroidism    Impaired glucose tolerance    Nephrolithiasis    Sleep apnea    On CPAP    Past Surgical History:  Procedure Laterality Date   BENTALL PROCEDURE N/A 12/22/2018   Procedure: BENTALL PROCEDURE;  Surgeon: Alleen Borne, MD;  Location: [facility] OR;  Service: Open Heart Surgery;  Laterality: N/A;  NEEDS BILATERAL RADIAL ARTERIAL LINES   CARDIAC CATHETERIZATION  1990s   CYSTOSCOPY  1990s   With stone extraction   CYSTOSCOPY W/ URETERAL STENT PLACEMENT Left 12/02/2015   Procedure:  CYSTOSCOPY WITH RETROGRADE PYELOGRAM/URETERAL STENT PLACEMENT;  Surgeon: Malen Gauze, MD;  Location: [facility];  Service: Urology;  Laterality: Left;   ESOPHAGOGASTRODUODENOSCOPY  1990s   KNEE ARTHROSCOPY Left 2005   LITHOTRIPSY  1990s   LITHOTRIPSY Left 11/2015   REPLACEMENT ASCENDING AORTA N/A 12/22/2018   Procedure: REPLACEMENT ASCENDING AORTA;  Surgeon: Alleen Borne, MD;  Location: [facility] OR;  Service: Open Heart Surgery;  Laterality: N/A;   RIGHT/LEFT HEART CATH AND CORONARY ANGIOGRAPHY N/A 12/09/2018   Procedure: RIGHT/LEFT HEART CATH AND CORONARY ANGIOGRAPHY;  Surgeon: Corky Crafts, MD;  [facility]: [facility] INVASIVE [facility] LAB;  Service: Cardiovascular;  Laterality: N/A;   TEE WITHOUT CARDIOVERSION N/A 12/22/2018   Procedure: TRANSESOPHAGEAL ECHOCARDIOGRAM (TEE);  Surgeon: Alleen Borne, MD;  Location: [facility] OR;  Service: Open Heart Surgery;  Laterality: N/A;   ULTRASOUND GUIDANCE FOR VASCULAR ACCESS  12/09/2018   Procedure: Ultrasound Guidance For Vascular Access;  Surgeon: Corky Crafts, MD;  Location: [facility] INVASIVE CV LAB;  Service: Cardiovascular;;   UMBILICAL HERNIA REPAIR  late 1990s     Current Outpatient Medications  Medication Sig Dispense Refill   acetaminophen (TYLENOL) 500 MG tablet Take 1,000 mg by mouth every 6 (six) hours as needed (pain).     amLODipine (NORVASC) 2.5  MG tablet Take 1 tablet (2.5 mg total) by mouth daily. 90 tablet 3   aspirin EC 81 MG tablet Take 81 mg by mouth daily.     HYDROcodone-acetaminophen (NORCO) 10-325 MG tablet Take 1 tablet by mouth 4 (four) times daily as needed.     metoprolol succinate (TOPROL-XL) 25 MG 24 hr tablet TAKE 1 TABLET BY MOUTH ONCE DAILY. (Patient taking differently: in the morning and at bedtime.) 90 tablet 0   promethazine (PHENERGAN) 25 MG tablet Take 25 mg by mouth every 6 (six) hours as needed for nausea or vomiting.     tamsulosin (FLOMAX) 0.4 MG CAPS capsule Take 1 capsule (0.4 mg total) by mouth daily after  supper. 30 capsule 0   traMADol (ULTRAM) 50 MG tablet Take 50 mg by mouth as needed.     No current facility-administered medications for this visit.    Allergies:   Tramadol hcl, Demerol [meperidine], and Stadol [butorphanol]    ROS:  Please see the history of present illness.   Otherwise, review of systems are positive for none.   All other systems are reviewed and negative.    PHYSICAL EXAM: VS:  BP 140/90 (BP Location: Left Arm, Patient Position: Sitting, Cuff Size: Normal)   Pulse (!) 58   Resp 18   Ht 5\' 5"  (1.651 m)   Wt 186 lb (84.4 kg)   SpO2 95%   BMI 30.95 kg/m  , BMI Body mass index is 30.95 kg/m. GENERAL:  Well appearing NECK:  No jugular venous distention, waveform within normal limits, carotid upstroke brisk and symmetric, no bruits, no thyromegaly LUNGS:  Clear to auscultation bilaterally CHEST: Well-healed surgical scar HEART:  PMI not displaced or sustained,S1 and S2 within normal limits, no S3, no S4, no clicks, no rubs, 3 out of 6 apical systolic murmur early to mid peaking, no diastolic murmurs ABD:  Flat, positive bowel sounds normal in frequency in pitch, no bruits, no rebound, no guarding, no midline pulsatile mass, no hepatomegaly, no splenomegaly EXT:  2 plus pulses throughout, no edema, no cyanosis no clubbing   EKG:  EKG is  ordered today. The ekg ordered today demonstrates sinus bradycardia, rate 58, leftward axis, intervals within normal limits, no acute T wave changes.   Recent Labs: No results found for requested labs within last 8760 hours.    Lipid Panel No results found for: CHOL, TRIG, HDL, CHOLHDL, VLDL, LDLCALC, LDLDIRECT    Wt Readings from Last 3 Encounters:  06/13/21 186 lb (84.4 kg)  05/25/21 187 lb (84.8 kg)  06/14/20 188 lb 12.8 oz (85.6 kg)      Other studies Reviewed: Additional studies/ records that were reviewed today include: Labs and neurology work-up including imaging reports. Review of the above records  demonstrates:  Please see elsewhere in the note.     ASSESSMENT AND PLAN:  AVR:    The valve was normal last year despite the murmur.  His exam is unchanged.  He has no new symptoms.  I will repeat an echo next year and see him after this.   AORTIC ANEURYSM REPAIR:   He had stable repair on CT early last year.  I will follow this clinically and with repeat CTs.   HTN:  The blood pressure is elevated and I reviewed a blood pressure diary.  I will add amlodipine 2.5 mg daily.   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  No change in therapy.  SLEEP APNEA:  He uses CPAP.    Current medicines are reviewed at length with the patient today.  The patient does not have concerns regarding medicines.  The following changes have been made: As above  Labs/ tests ordered today include: None  Orders Placed This Encounter  Procedures   EKG 12-Lead   ECHOCARDIOGRAM COMPLETE      Disposition:   FU with me in 1 year   Signed, Rollene Rotunda, MD  06/13/2021 7:40 PM    [facility]

## 2021-06-13 ENCOUNTER — Encounter: Payer: Self-pay | Admitting: Cardiology

## 2021-06-13 ENCOUNTER — Other Ambulatory Visit: Payer: Self-pay

## 2021-06-13 ENCOUNTER — Ambulatory Visit (INDEPENDENT_AMBULATORY_CARE_PROVIDER_SITE_OTHER): Payer: Commercial Managed Care - PPO | Admitting: Cardiology

## 2021-06-13 VITALS — BP 140/90 | HR 58 | Resp 18 | Ht 65.0 in | Wt 186.0 lb

## 2021-06-13 DIAGNOSIS — Z8679 Personal history of other diseases of the circulatory system: Secondary | ICD-10-CM | POA: Diagnosis not present

## 2021-06-13 DIAGNOSIS — Z952 Presence of prosthetic heart valve: Secondary | ICD-10-CM

## 2021-06-13 DIAGNOSIS — Z9889 Other specified postprocedural states: Secondary | ICD-10-CM | POA: Diagnosis not present

## 2021-06-13 MED ORDER — AMLODIPINE BESYLATE 2.5 MG PO TABS: 2.5000 mg | ORAL_TABLET | Freq: Every day | ORAL | 3 refills | Status: DC

## 2021-06-13 NOTE — Patient Instructions (Addendum)
Medication Instructions:  BEGIN amlodipine (Norvasc) 2.5mg  (1 tablet) daily.  *If you need a refill on your cardiac medications before your next appointment, please call your pharmacy*   Lab Work: None ordered.   Testing/Procedures: To be scheduled at [facility] street in June 2023: Your physician has requested that you have an echocardiogram. Echocardiography is a painless test that uses sound waves to create images of your heart. It provides your doctor with information about the size and shape of your heart and how well your heart's chambers and valves are working. This procedure takes approximately one hour. There are no restrictions for this procedure.    Follow-Up: At [facility], you and your health needs are our priority.  As part of our continuing mission to provide you with exceptional heart care, we have created designated Provider Care Teams.  These Care Teams include your primary Cardiologist (physician) and Advanced Practice Providers (APPs -  Physician Assistants and Nurse Practitioners) who all work together to provide you with the care you need, when you need it.  We recommend signing up for the patient portal called "MyChart".  Sign up information is provided on this After Visit Summary.  MyChart is used to connect with patients for Virtual Visits (Telemedicine).  Patients are able to view lab/test results, encounter notes, upcoming appointments, etc.  Non-urgent messages can be sent to your provider as well.   To learn more about what you can do with MyChart, go to [email or web address].    Your next appointment:   Follow up following echo, about 1 year.  The format for your next appointment:   In Person  Provider:   Rollene Rotunda, MD

## 2021-06-15 ENCOUNTER — Other Ambulatory Visit: Payer: Self-pay | Admitting: Neurology

## 2021-06-18 LAB — ANTIPHOSPHOLOPID AB PANEL
Anticardiolipin IgA: <2 APL-U/mL (ref <20.0)
Anticardiolipin IgG: <2 GPL-U/mL (ref <20.0)
Anticardiolipin IgM: <2 MPL-U/mL (ref <20.0)
Beta-2 Glyco 1 IgA: <2 U/mL (ref <20.0)
Beta-2 Glyco 1 IgM: <2 U/mL (ref <20.0)
Beta-2 Glyco I IgG: <2 U/mL (ref <20.0)
PHOSPHATIDYLSERINE AB  (IGG): 14 U/mL — ABNORMAL HIGH (ref <10)
PHOSPHATIDYLSERINE AB  (IGM): <25 U/mL (ref <25)
PHOSPHATIDYLSERINE AB (IGA): <20 U/mL (ref <20)

## 2021-06-18 LAB — SM AND SM/RNP ANTIBODIES
ENA SM Ab Ser-aCnc: <1 AI
SM/RNP: <1 AI

## 2021-06-18 LAB — ANTI-DNA ANTIBODY, DOUBLE-STRANDED: ds DNA Ab: 1 IU/mL

## 2021-07-03 ENCOUNTER — Other Ambulatory Visit (HOSPITAL_COMMUNITY): Payer: Self-pay | Admitting: Family Medicine

## 2021-07-03 ENCOUNTER — Telehealth: Payer: Self-pay | Admitting: Neurology

## 2021-07-03 ENCOUNTER — Other Ambulatory Visit: Payer: Self-pay

## 2021-07-03 ENCOUNTER — Ambulatory Visit (HOSPITAL_COMMUNITY)
Admission: RE | Admit: 2021-07-03 | Discharge: 2021-07-03 | Disposition: A | Discharge status: Home / Self Care | Payer: Commercial Managed Care - PPO | Source: Ambulatory Visit | Attending: Family Medicine | Admitting: Family Medicine

## 2021-07-03 DIAGNOSIS — G459 Transient cerebral ischemic attack, unspecified: Secondary | ICD-10-CM | POA: Diagnosis present

## 2021-07-03 DIAGNOSIS — Z8673 Personal history of transient ischemic attack (TIA), and cerebral infarction without residual deficits: Secondary | ICD-10-CM | POA: Diagnosis not present

## 2021-07-03 NOTE — Telephone Encounter (Signed)
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    I spoke to Dr. Sudie Bailey.  The patient experience additional neurologic symptoms including aphasia.    This happened yesterday and he is better today. This is worrisome for a TIA or small stroke.  He will order a carotid Doppler and MRI of the brain.  I will try to get Cameron [name] in to see [place] sometime in the next week.

## 2021-07-04 NOTE — Telephone Encounter (Signed)
Dr. Epimenio Foot- FYI Called and spoke w/ wife. Offered appt this week. She declined, wanting him to get MRI first. She is out of town all next week w/ exception of Friday but our office is closed that day. Scheduled appt for 07/18/21 at 1:30pm w/ Dr. Epimenio Foot.   His norvasc was adjusted to 2.5mg  po BID. Instructed to take 2 in the am and 1 in the pm if elevated. He has been doing well since event, stable.

## 2021-07-04 NOTE — Telephone Encounter (Addendum)
Tried [phone]. Unavailable. Karyl Kinnier (spouse). LVM to call office back to schedule appt. If they call, can offer MSNP slot to schedule pt. Thank you

## 2021-07-07 ENCOUNTER — Telehealth: Payer: Self-pay | Admitting: *Deleted

## 2021-07-07 ENCOUNTER — Encounter: Payer: Self-pay | Admitting: *Deleted

## 2021-07-07 ENCOUNTER — Other Ambulatory Visit (HOSPITAL_COMMUNITY): Payer: Self-pay | Admitting: Physician Assistant

## 2021-07-07 DIAGNOSIS — I639 Cerebral infarction, unspecified: Secondary | ICD-10-CM

## 2021-07-07 NOTE — Telephone Encounter (Signed)
Dr Jens Som (DOD), discussed patient with dr Sudie Bailey. There is a question of stroke. Dr Sudie Bailey is getting and echo. Per dr Jens Som order placed for 30 day event monitor.

## 2021-07-07 NOTE — Progress Notes (Unsigned)
Cardiac event monitor mailed to pt. 

## 2021-07-07 NOTE — Progress Notes (Signed)
I was called this afternoon by the patient's PCP, Dr. [name].  Per his report, the patient has had 2 recent strokes from unknown etiology, but with negative MRI and negative carotid Doppler.  Patient does have a history of atrial fibrillation that he was apparently on Eliquis for in the past.  However, patient's atrial fibrillation resolved after valve replacement and he has been in normal sinus rhythm for the past 5 years.  Patient has no history of blood clots.  Dr. Sudie Bailey requested that we see this patient for work-up of any potential hypercoagulable state, and we will happily oblige.  Coagulopathy panel has been ordered, and we will see the patient for consultation 1 week after labs are obtained.  Rojelio Brenner PA-C 07/07/2021 1:40 PM

## 2021-07-10 ENCOUNTER — Other Ambulatory Visit (HOSPITAL_COMMUNITY): Payer: Self-pay

## 2021-07-10 ENCOUNTER — Other Ambulatory Visit: Payer: Self-pay | Admitting: Cardiology

## 2021-07-10 DIAGNOSIS — I639 Cerebral infarction, unspecified: Secondary | ICD-10-CM

## 2021-07-10 DIAGNOSIS — I4891 Unspecified atrial fibrillation: Secondary | ICD-10-CM

## 2021-07-10 DIAGNOSIS — R001 Bradycardia, unspecified: Secondary | ICD-10-CM

## 2021-07-10 DIAGNOSIS — R9431 Abnormal electrocardiogram [ECG] [EKG]: Secondary | ICD-10-CM

## 2021-07-10 NOTE — Addendum Note (Signed)
Addended by: Horton Marshall on: 07/10/2021 08:03 AM   Modules accepted: Orders

## 2021-07-10 NOTE — Addendum Note (Signed)
Addended by: Horton Marshall on: 07/10/2021 08:05 AM   Modules accepted: Orders

## 2021-07-11 ENCOUNTER — Other Ambulatory Visit: Payer: Self-pay | Admitting: Family Medicine

## 2021-07-11 ENCOUNTER — Other Ambulatory Visit: Payer: Self-pay

## 2021-07-11 ENCOUNTER — Other Ambulatory Visit (HOSPITAL_COMMUNITY): Payer: Self-pay | Admitting: Family Medicine

## 2021-07-11 ENCOUNTER — Ambulatory Visit (HOSPITAL_COMMUNITY)
Admission: RE | Admit: 2021-07-11 | Discharge: 2021-07-11 | Disposition: A | Discharge status: Home / Self Care | Payer: Commercial Managed Care - PPO | Source: Ambulatory Visit | Attending: Family Medicine | Admitting: Family Medicine

## 2021-07-11 DIAGNOSIS — G459 Transient cerebral ischemic attack, unspecified: Secondary | ICD-10-CM | POA: Diagnosis present

## 2021-07-11 MED ORDER — GADOBUTROL 1 MMOL/ML IV SOLN
9.0000 mL | Freq: Once | INTRAVENOUS | Status: AC | PRN
  Administered 2021-07-11: 9 mL via INTRAVENOUS

## 2021-07-12 ENCOUNTER — Other Ambulatory Visit (HOSPITAL_COMMUNITY)
Admission: RE | Admit: 2021-07-12 | Discharge: 2021-07-12 | Disposition: A | Discharge status: Home / Self Care | Payer: Commercial Managed Care - PPO | Source: Ambulatory Visit | Attending: Internal Medicine | Admitting: Internal Medicine

## 2021-07-12 ENCOUNTER — Other Ambulatory Visit (HOSPITAL_COMMUNITY): Payer: Self-pay

## 2021-07-12 ENCOUNTER — Inpatient Hospital Stay (HOSPITAL_COMMUNITY): Payer: Commercial Managed Care - PPO | Attending: Hematology

## 2021-07-12 DIAGNOSIS — R760 Raised antibody titer: Secondary | ICD-10-CM | POA: Insufficient documentation

## 2021-07-12 DIAGNOSIS — I639 Cerebral infarction, unspecified: Secondary | ICD-10-CM

## 2021-07-12 DIAGNOSIS — Z823 Family history of stroke: Secondary | ICD-10-CM | POA: Insufficient documentation

## 2021-07-12 DIAGNOSIS — Z7901 Long term (current) use of anticoagulants: Secondary | ICD-10-CM | POA: Insufficient documentation

## 2021-07-12 DIAGNOSIS — Z801 Family history of malignant neoplasm of trachea, bronchus and lung: Secondary | ICD-10-CM | POA: Diagnosis not present

## 2021-07-12 LAB — D-DIMER, QUANTITATIVE: D-Dimer, Quant: 0.76 ug/mL-FEU — ABNORMAL HIGH (ref 0.00–0.50)

## 2021-07-12 NOTE — Progress Notes (Signed)
Patient originally wanted labs at [facility]. Order was faxed to patient per request.  [facility] is unable do some of the labs ordered per patient.  Replaced orders per Rojelio Brenner, PA original order.

## 2021-07-13 LAB — BETA-2-GLYCOPROTEIN I ABS, IGG/M/A
Beta-2 Glyco I IgG: <9 GPI IgG units (ref 0–20)
Beta-2-Glycoprotein I IgA: <9 GPI IgA units (ref 0–25)
Beta-2-Glycoprotein I IgM: <9 GPI IgM units (ref 0–32)

## 2021-07-13 LAB — LUPUS ANTICOAGULANT PANEL
DRVVT: 53.6 s — ABNORMAL HIGH (ref 0.0–47.0)
PTT Lupus Anticoagulant: 37.7 s (ref 0.0–51.9)

## 2021-07-13 LAB — PROTEIN S ACTIVITY: Protein S Activity: 102 % (ref 63–140)

## 2021-07-13 LAB — PROTEIN C ACTIVITY: Protein C Activity: 159 % (ref 73–180)

## 2021-07-13 LAB — DRVVT CONFIRM: dRVVT Confirm: 1.1 ratio (ref 0.8–1.2)

## 2021-07-13 LAB — CARDIOLIPIN ANTIBODIES, IGG, IGM, IGA
Anticardiolipin IgA: <9 APL U/mL (ref 0–11)
Anticardiolipin IgG: 13 GPL U/mL (ref 0–14)
Anticardiolipin IgM: <9 MPL U/mL (ref 0–12)

## 2021-07-13 LAB — PROTEIN C, TOTAL: Protein C, Total: 172 % — ABNORMAL HIGH (ref 60–150)

## 2021-07-13 LAB — DRVVT MIX: dRVVT Mix: 46.8 s — ABNORMAL HIGH (ref 0.0–40.4)

## 2021-07-13 LAB — ANTITHROMBIN III: AntiThromb III Func: 108 % (ref 75–120)

## 2021-07-14 ENCOUNTER — Other Ambulatory Visit: Payer: Self-pay

## 2021-07-14 ENCOUNTER — Ambulatory Visit (HOSPITAL_COMMUNITY): Payer: Commercial Managed Care - PPO | Attending: Cardiovascular Disease

## 2021-07-14 DIAGNOSIS — Z952 Presence of prosthetic heart valve: Secondary | ICD-10-CM | POA: Insufficient documentation

## 2021-07-14 LAB — ECHOCARDIOGRAM COMPLETE
AV Mean grad: 28.7 mmHg
AV Peak grad: 54.9 mmHg
Ao pk vel: 3.7 m/s
Area-P 1/2: 2.87 cm2
S' Lateral: 3.4 cm

## 2021-07-14 MED ORDER — PERFLUTREN LIPID MICROSPHERE
1.0000 mL | INTRAVENOUS | Status: AC | PRN
  Administered 2021-07-14: 1 mL via INTRAVENOUS

## 2021-07-15 ENCOUNTER — Ambulatory Visit (INDEPENDENT_AMBULATORY_CARE_PROVIDER_SITE_OTHER): Payer: Commercial Managed Care - PPO

## 2021-07-15 DIAGNOSIS — R9431 Abnormal electrocardiogram [ECG] [EKG]: Secondary | ICD-10-CM

## 2021-07-15 DIAGNOSIS — R001 Bradycardia, unspecified: Secondary | ICD-10-CM

## 2021-07-15 DIAGNOSIS — I639 Cerebral infarction, unspecified: Secondary | ICD-10-CM

## 2021-07-15 DIAGNOSIS — I4891 Unspecified atrial fibrillation: Secondary | ICD-10-CM | POA: Diagnosis not present

## 2021-07-17 ENCOUNTER — Other Ambulatory Visit: Payer: Self-pay | Admitting: *Deleted

## 2021-07-17 DIAGNOSIS — Z952 Presence of prosthetic heart valve: Secondary | ICD-10-CM

## 2021-07-17 DIAGNOSIS — T8209XA Other mechanical complication of heart valve prosthesis, initial encounter: Secondary | ICD-10-CM

## 2021-07-18 ENCOUNTER — Ambulatory Visit (INDEPENDENT_AMBULATORY_CARE_PROVIDER_SITE_OTHER): Payer: Commercial Managed Care - PPO | Admitting: Neurology

## 2021-07-18 ENCOUNTER — Other Ambulatory Visit: Payer: Self-pay | Admitting: *Deleted

## 2021-07-18 ENCOUNTER — Encounter: Payer: Self-pay | Admitting: Neurology

## 2021-07-18 ENCOUNTER — Other Ambulatory Visit: Payer: Self-pay

## 2021-07-18 VITALS — BP 138/86 | HR 72 | Ht 65.0 in | Wt 189.0 lb

## 2021-07-18 DIAGNOSIS — Z952 Presence of prosthetic heart valve: Secondary | ICD-10-CM

## 2021-07-18 DIAGNOSIS — G4489 Other headache syndrome: Secondary | ICD-10-CM

## 2021-07-18 DIAGNOSIS — H53131 Sudden visual loss, right eye: Secondary | ICD-10-CM | POA: Diagnosis not present

## 2021-07-18 DIAGNOSIS — T8209XA Other mechanical complication of heart valve prosthesis, initial encounter: Secondary | ICD-10-CM

## 2021-07-18 DIAGNOSIS — R9082 White matter disease, unspecified: Secondary | ICD-10-CM

## 2021-07-18 DIAGNOSIS — I639 Cerebral infarction, unspecified: Secondary | ICD-10-CM | POA: Insufficient documentation

## 2021-07-18 DIAGNOSIS — R760 Raised antibody titer: Secondary | ICD-10-CM

## 2021-07-18 DIAGNOSIS — Z01812 Encounter for preprocedural laboratory examination: Secondary | ICD-10-CM

## 2021-07-18 LAB — FACTOR 5 LEIDEN

## 2021-07-18 NOTE — Progress Notes (Addendum)
[facility]  PATIENT: MEREL SANTOLI DOB: [birth year]  REFERRING DOCTOR OR PCP: Leslie Andrea, MD SOURCE: Patient, notes from primary care, imaging and laboratory reports, MRI images personally reviewed.  _________________________________   HISTORICAL  CHIEF COMPLAINT:  Chief Complaint  Patient presents with   Follow-up    Rm 2, with wife. Pt here today for further evaluation for TIA.    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    Symptoms are better but not completely resolved.   He started to improve within a couple hours.   He has done Speech therapy exercises and feels benefit.     I spoke to Dr. Karie Kirks 07/03/2021.  He ordered MRI of the brain, echocardiogram and carotid Dopplers.  MRI 07/11/2021 showed an acute CVA .     He has been on ELiquis x 1 week.    He has a Holter monitor placed.   Echo showed's some calcification of his cadaveric aortic valve replacement    cardiac CT is pending.  Carotid ultrasound showed bilateral carotid intimal thickening but no focal plaque accumulation or significant stenosis.  After the initial visit 05/25/2021, we checked some blood work including ANA, ESR and C-reactive peptide.  The ANA was mildly positive at 1: 80.  Additional tests were performed including antiphospholipid antibody which was abnormal showing mildly elevated phosphatidylserine IgG.  After the stroke he had a hypercoagulability panel performed which showed increased DRV VT worrisome for lupus anticoagulant.   He will be seeing hematology tomorrow.   Other neurologic events discussed at initial visit 05/25/2021:  He experience transient loss of vision on the right and lightheadedness on 05/06/2021 with sudden onset.    Concurrently, he had a left sided headache.    The headache lasted was throbbing and lasted 4-5 hours.    He had mild photophobia but no phonophobia.  No nausea or vomiting.     Pain improved after 4-5 hours but did not resolve for 2 more days.    He feels his git has been slower since the event.    Vision slowly improved and is now close to baseline though still seems a little foggy.     BP has been mildly elevated since th episode.     Around 2002, he had an episode of amaurosis fugax lasting a couple hours.  An evaluation included a cardiac catheterization.  Arteries were fine but he had MVP.  He had an episode of Afib around 2017.     He had an AVR (for aortic stenosis) and thoracic aortic aneurysm repair in 2019.     He has a bovine valve.  He is not on any anti-coagulants.   He is on metoprolol.    He has a cardiology appt later this month.     He has a history of migraine headaches but none x years.   Migraines used to be associated wit N/V and visual aura, different than symptoms he experienced more recently.  He has OSA and is on CPAP.  His wife takes his blood pressure regularly.  She showed me a log over the past few weeks.  Blood pressures generally been running mildly high with a typical reading of 130-140/80-90.  Imaging: MRI of the brain 07/11/2021 shows a subacute infarction of the right corona radiata.    MRI of the brain 05/20/2021 shows some scattered T2/FLAIR hyperintense foci in the subcortical and deep white matter.  None of the foci appear to be acute.  They are consistent with chronic microvascular ischemic change.  Brain volume is normal.  No acute findings.  MRA of the head 05/20/2021 is normal.   Incidental note of azygos ACA (normal variant)   Labs 07/12/2021: D-dimer mildly elevated at 0.76 (< 0.5 normal);   Lu[us anticoagulant is negative though DRVVT slightly elevated at 53.  dRVVT Mix slightly elevated (46.8) and dRVVT confirm was normal; phosphatidylserine Ab IgG was elevated; ANA was 1:80 (nuclear; homogenous)  Protein S, Protein C, ATIII, Factor V leiden, PT/PTT, cardiolipin Abs, anti-DNA Ab, Sm and Sm/RNP b were negative.   REVIEW OF  SYSTEMS: Constitutional: No fevers, chills, sweats, or change in appetite Eyes: No visual changes, double vision, eye pain Ear, nose and throat: No hearing loss, ear pain, nasal congestion, sore throat Cardiovascular: No chest pain, palpitations Respiratory:  No shortness of breath at rest or with exertion.   No wheezes GastrointestinaI: No nausea, vomiting, diarrhea, abdominal pain, fecal incontinence Genitourinary:  No dysuria, urinary retention or frequency.  No nocturia. Musculoskeletal:  No neck pain, back pain Integumentary: No rash, pruritus, skin lesions Neurological: as above Psychiatric: No depression at this time.  No anxiety Endocrine: No palpitations, diaphoresis, change in appetite, change in weigh or increased thirst Hematologic/Lymphatic:  No anemia, purpura, petechiae. Allergic/Immunologic: No itchy/runny eyes, nasal congestion, recent allergic reactions, rashes  ALLERGIES: Allergies  Allergen Reactions   Tramadol Hcl Itching    Patient tolerates specific NDC generic product - family is able to bring supply in from home.    Demerol [Meperidine] Anxiety and Other (See Comments)    Makes pt hyper, the higher the dose the more hyper pt gets   Stadol [Butorphanol] Anxiety and Other (See Comments)    Hyper- the higher the dose the more hyper pt gets    HOME MEDICATIONS:  Current Outpatient Medications:    acetaminophen (TYLENOL) 500 MG tablet, Take 1,000 mg by mouth every 6 (six) hours as needed (pain)., Disp: , Rfl:    amLODipine (NORVASC) 5 MG tablet, Take 5 mg by mouth 2 (two) times daily., Disp: , Rfl:    apixaban (ELIQUIS) 5 MG TABS tablet, Take 5 mg by mouth 2 (two) times daily., Disp: , Rfl:    cholecalciferol (VITAMIN D3) 25 MCG (1000 UNIT) tablet, Take 2,000 Units by mouth daily., Disp: , Rfl:    HYDROcodone-acetaminophen (NORCO) 10-325 MG tablet, Take 1 tablet by mouth 4 (four) times daily as needed., Disp: , Rfl:    metoprolol succinate (TOPROL-XL) 25 MG 24  hr tablet, TAKE 1 TABLET BY MOUTH ONCE DAILY. (Patient taking differently: in the morning and at bedtime.), Disp: 90 tablet, Rfl: 0   promethazine (PHENERGAN) 25 MG tablet, Take 25 mg by mouth every 6 (six) hours as needed for nausea or vomiting., Disp: , Rfl:    tamsulosin (FLOMAX) 0.4 MG CAPS capsule, Take 1 capsule (0.4 mg total) by mouth daily after supper., Disp: 30 capsule, Rfl: 0   traMADol (ULTRAM) 50 MG tablet, Take 50 mg by mouth as needed., Disp: , Rfl:   PAST MEDICAL HISTORY: Past Medical History:  Diagnosis Date   Aortic stenosis, severe    Arthritis    Ascending aortic aneurysm (HCC)    GERD (gastroesophageal reflux disease)    Headache    History of heart murmur in childhood    History of kidney stones    Hyperthyroidism    Impaired glucose tolerance    Nephrolithiasis  Sleep apnea    On CPAP    PAST SURGICAL HISTORY: Past Surgical History:  Procedure Laterality Date   BENTALL PROCEDURE N/A 12/22/2018   Procedure: BENTALL PROCEDURE;  Surgeon: Gaye Pollack, MD;  Location: [place];  Service: Open Heart Surgery;  Laterality: N/A;  NEEDS BILATERAL RADIAL ARTERIAL LINES   CARDIAC CATHETERIZATION  1990s   CYSTOSCOPY  1990s   With stone extraction   CYSTOSCOPY W/ URETERAL STENT PLACEMENT Left 12/02/2015   Procedure: CYSTOSCOPY WITH RETROGRADE PYELOGRAM/URETERAL STENT PLACEMENT;  Surgeon: Cleon Gustin, MD;  Location: [facility];  Service: Urology;  Laterality: Left;   ESOPHAGOGASTRODUODENOSCOPY  1990s   KNEE ARTHROSCOPY Left 2005   LITHOTRIPSY  1990s   LITHOTRIPSY Left 11/2015   REPLACEMENT ASCENDING AORTA N/A 12/22/2018   Procedure: REPLACEMENT ASCENDING AORTA;  Surgeon: Gaye Pollack, MD;  Location: [place] [facility];  Service: Open Heart Surgery;  Laterality: N/A;   RIGHT/LEFT HEART CATH AND CORONARY ANGIOGRAPHY N/A 12/09/2018   Procedure: RIGHT/LEFT HEART CATH AND CORONARY ANGIOGRAPHY;  Surgeon: Jettie Booze, MD;  Location: [facility];  Service:  Cardiovascular;  Laterality: N/A;   TEE WITHOUT CARDIOVERSION N/A 12/22/2018   Procedure: TRANSESOPHAGEAL ECHOCARDIOGRAM (TEE);  Surgeon: Gaye Pollack, MD;  Location: [place];  Service: Open Heart Surgery;  Laterality: N/A;   ULTRASOUND GUIDANCE FOR VASCULAR ACCESS  12/09/2018   Procedure: Ultrasound Guidance For Vascular Access;  Surgeon: Jettie Booze, MD;  Location: [facility];  Service: Cardiovascular;;   UMBILICAL HERNIA REPAIR  late 1990s    FAMILY HISTORY: Family History  Problem Relation Age of Onset   Depression Mother    Hyperlipidemia Mother    Cancer Father    Kidney disease Father    Breast cancer Maternal Grandmother    Heart disease Maternal Grandfather     SOCIAL HISTORY:  Social History   Socioeconomic History   Marital status: Married    Spouse name: Kieth Brightly   Number of children: Not on file   Years of education: Not on file   Highest education level: Not on file  Occupational History   Not on file  Tobacco Use   Smoking status: Never    Passive exposure: Yes   Smokeless tobacco: Never  Vaping Use   Vaping Use: Never used  Substance and Sexual Activity   Alcohol use: Yes    Comment: Socially   Drug use: Never   Sexual activity: Yes  Other Topics Concern   Not on file  Social History Narrative   Not on file   Social Determinants of Health   Financial Resource Strain: Not on file  Food Insecurity: Not on file  Transportation Needs: Not on file  Physical Activity: Not on file  Stress: Not on file  Social Connections: Not on file  Intimate Partner Violence: Not on file     PHYSICAL EXAM  Vitals:   07/18/21 1305  BP: 138/86  Pulse: 72  Weight: 189 lb (85.7 kg)  Height: '5\' 5"'  (1.651 m)    Body mass index is 31.45 kg/m.  VA:  OD  20/30-2; OS 20/20-2  General: The patient is well-developed and well-nourished and in no acute distress.    HEENT:  Head is [place]/AT.  Sclera are anicteric.  Marland Kitchen  Neck: No carotid bruits are  noted.    Skin: Extremities are without rash or  edema.   Neurologic Exam  Mental status: The patient is alert and oriented x 3 at the  time of the examination. The patient has apparent normal recent and remote memory, with an apparently normal attention span and concentration ability.   Speech is normal.  Cranial nerves: Extraocular movements are full.  Minimal facial asymmetry with an decrease nasolabial fold on the left.  There is good facial sensation to soft touch bilaterally.  Trapezius and sternocleidomastoid strength is normal. Mild dysarthria is noted.  The tongue is midline, and the patient has symmetric elevation of the soft palate. No obvious hearing deficits are noted.  Motor:  Muscle bulk is normal.   Tone is normal. Strength is  5 / 5 in all 4 extremities.   Sensory: Sensory testing is intact to pinprick, soft touch and vibration sensation in all 4 extremities.  Coordination: Cerebellar testing reveals good finger-nose-finger and heel-to-shin bilaterally.  Gait and station: Station is normal.   Gait is arthritic and mildly wide. Romberg is negative.   Reflexes: Deep tendon reflexes are symmetric and normal bilaterally.      DIAGNOSTIC DATA (LABS, IMAGING, TESTING) - I reviewed patient records, labs, notes, testing and imaging myself where available.  Lab Results  Component Value Date   WBC 8.9 12/27/2018   HGB 9.1 (L) 12/27/2018   HCT 28.6 (L) 12/27/2018   MCV 93.2 12/27/2018   PLT 190 12/27/2018      Component Value Date/Time   NA 139 12/27/2018 0323   NA 144 12/04/2018 1259   K 3.7 12/27/2018 0323   CL 108 12/27/2018 0323   CO2 23 12/27/2018 0323   GLUCOSE 99 12/27/2018 0323   BUN 20 12/27/2018 0323   BUN 15 12/04/2018 1259   CREATININE 0.86 12/27/2018 0323   CALCIUM 8.0 (L) 12/27/2018 0323   PROT 6.7 12/19/2018 1202   ALBUMIN 4.2 12/19/2018 1202   AST 20 12/19/2018 1202   ALT 22 12/19/2018 1202   ALKPHOS 38 12/19/2018 1202   BILITOT 0.9 12/19/2018  1202   GFRNONAA >60 12/27/2018 0323   GFRAA >60 12/27/2018 0323   No results found for: CHOL, HDL, LDLCALC, LDLDIRECT, TRIG, CHOLHDL Lab Results  Component Value Date   HGBA1C 5.4 12/19/2018       ASSESSMENT AND PLAN  Acute stroke due to ischemia (HCC)  Acute visual loss, right  Other headache syndrome  White matter abnormality on MRI of brain  Lupus anticoagulant positive    We discussed the recent stroke.  Given the presence of a lupus anticoagulant and elevated phosphatidylserine IgG, it is reasonable to be on anticoagulation.  He is currently on Eliquis.  He could have compliance issues with Coumadin. It is uncertain why he would develop a lupus anticoagulant.  He will be seeing hematology tomorrow.  He may also need to see rheumatology. He will return to see [place] in 6 months or sooner for new or worsening neurologic symptoms.     Stevenson Windmiller A. Felecia Shelling, MD, [facility] 1/56/1537, 9:43 PM Certified in Neurology, Clinical Neurophysiology, Sleep Medicine and Neuroimaging  [facility] [address], [place], [place] [address] [phone]

## 2021-07-18 NOTE — Progress Notes (Signed)
[facility] [address] [place] [address]   CLINIC:  Medical Oncology/Hematology  CONSULT NOTE  Patient Care Team: [name], MD as PCP - General (Family Medicine) Rollene Rotunda, MD as PCP - Cardiology (Cardiology)  CHIEF COMPLAINTS/PURPOSE OF CONSULTATION:  Workup for possible hypercoagulable disorder  HISTORY OF PRESENTING ILLNESS:  Cameron [name] 65 y.o. male is here at the request of his PCP, Dr. [name] for workup of possible hypercoagulable disorder.    Per discussion with Dr. 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  Patient does have a history of atrial fibrillation that he was apparently on Xarelto for less than 1 month in the past.  Patient reports that his cardiologist took him off of Xarelto and placed him on 81 mg of aspirin instead.  Patient had TIA symptoms that occurred in May 2022, but imaging at that time showed chronic ischemic vascular disease without any acute CVA.  Patient had recurrent symptoms on 07/01/2021 with left-sided facial droop, slurred speech, and dysphagia.  Symptoms have improved but are not completely resolved.  (He continues to have some mild difficulty with word finding and has mild left visual field deficits).  MRI on 07/11/2021 showed an acute CVA.  He was placed on Eliquis a week ago (the day before hematology labs were drawn).  Aspirin was discontinued after he was placed on Eliquis.  He has also been referred to cardiology and neurology for work-up of stroke etiology.  Patient has no history of blood clots.  No current signs or symptoms of DVT or PE such as unilateral leg swelling, chest pain, dyspnea, or hemoptysis.  Family history is positive for stroke (mother) and lung cancer (father).  No family history of miscarriages or blood clots.  Patient's medical history is otherwise positive for mitral valve  replacement (tissue valve) and thoracic aortic aneurysm repair.  He also has stroke risk factors including hypertension and obesity.  Patient is a retired Midwife, who is currently working at an [employer].  He lives at home with his wife and is functionally independent with ADLs.  He denies any smoking or illicit drug use.  He drinks occasional social alcohol.   MEDICAL HISTORY:  Past Medical History:  Diagnosis Date   Aortic stenosis, severe    Arthritis    Ascending aortic aneurysm (HCC)    GERD (gastroesophageal reflux disease)    Headache    History of heart murmur in childhood    History of kidney stones    Hyperthyroidism    Impaired glucose tolerance    Nephrolithiasis    Sleep apnea    On CPAP    SURGICAL HISTORY: Past Surgical History:  Procedure Laterality Date   BENTALL PROCEDURE N/A 12/22/2018   Procedure: BENTALL PROCEDURE;  Surgeon: Alleen Borne, MD;  Location: [facility] OR;  Service: Open Heart Surgery;  Laterality: N/A;  NEEDS BILATERAL RADIAL ARTERIAL LINES   CARDIAC CATHETERIZATION  1990s   CYSTOSCOPY  1990s   With stone extraction   CYSTOSCOPY W/ URETERAL STENT PLACEMENT Left 12/02/2015   Procedure: CYSTOSCOPY WITH RETROGRADE PYELOGRAM/URETERAL STENT PLACEMENT;  Surgeon: Malen Gauze, MD;  Location: [facility] ORS;  Service: Urology;  Laterality: Left;   ESOPHAGOGASTRODUODENOSCOPY  1990s   KNEE ARTHROSCOPY Left 2005   LITHOTRIPSY  1990s   LITHOTRIPSY Left 11/2015   REPLACEMENT ASCENDING AORTA N/A 12/22/2018   Procedure: REPLACEMENT  ASCENDING AORTA;  Surgeon: Alleen Borne, MD;  Location: [facility] OR;  Service: Open Heart Surgery;  Laterality: N/A;   RIGHT/LEFT HEART CATH AND CORONARY ANGIOGRAPHY N/A 12/09/2018   Procedure: RIGHT/LEFT HEART CATH AND CORONARY ANGIOGRAPHY;  Surgeon: Corky Crafts, MD;  Location: [facility] INVASIVE CV LAB;  Service: Cardiovascular;  Laterality: N/A;   TEE WITHOUT CARDIOVERSION N/A 12/22/2018   Procedure: TRANSESOPHAGEAL  ECHOCARDIOGRAM (TEE);  Surgeon: Alleen Borne, MD;  Location: [facility] OR;  Service: Open Heart Surgery;  Laterality: N/A;   ULTRASOUND GUIDANCE FOR VASCULAR ACCESS  12/09/2018   Procedure: Ultrasound Guidance For Vascular Access;  Surgeon: Corky Crafts, MD;  Location: [facility] INVASIVE CV LAB;  Service: Cardiovascular;;   UMBILICAL HERNIA REPAIR  late 1990s    SOCIAL HISTORY: Social History   Socioeconomic History   Marital status: Married    Spouse name: Boyd Kerbs   Number of children: Not on file   Years of education: Not on file   Highest education level: Not on file  Occupational History   Not on file  Tobacco Use   Smoking status: Never    Passive exposure: Yes   Smokeless tobacco: Never  Vaping Use   Vaping Use: Never used  Substance and Sexual Activity   Alcohol use: Yes    Comment: Socially   Drug use: Never   Sexual activity: Yes  Other Topics Concern   Not on file  Social History Narrative   Not on file   Social Determinants of Health   Financial Resource Strain: Not on file  Food Insecurity: Not on file  Transportation Needs: Not on file  Physical Activity: Not on file  Stress: Not on file  Social Connections: Not on file  Intimate Partner Violence: Not on file    FAMILY HISTORY: Family History  Problem Relation Age of Onset   Depression Mother    Hyperlipidemia Mother    Cancer Father    Kidney disease Father    Breast cancer Maternal Grandmother    Heart disease Maternal Grandfather     ALLERGIES:  is allergic to tramadol hcl, demerol [meperidine], and stadol [butorphanol].  MEDICATIONS:  Current Outpatient Medications  Medication Sig Dispense Refill   acetaminophen (TYLENOL) 500 MG tablet Take 1,000 mg by mouth every 6 (six) hours as needed (pain).     amLODipine (NORVASC) 5 MG tablet Take 5 mg by mouth 2 (two) times daily.     apixaban (ELIQUIS) 5 MG TABS tablet Take 5 mg by mouth 2 (two) times daily.     cholecalciferol (VITAMIN D3) 25 MCG  (1000 UNIT) tablet Take 2,000 Units by mouth daily.     HYDROcodone-acetaminophen (NORCO) 10-325 MG tablet Take 1 tablet by mouth 4 (four) times daily as needed.     metoprolol succinate (TOPROL-XL) 25 MG 24 hr tablet TAKE 1 TABLET BY MOUTH ONCE DAILY. (Patient taking differently: in the morning and at bedtime.) 90 tablet 0   promethazine (PHENERGAN) 25 MG tablet Take 25 mg by mouth every 6 (six) hours as needed for nausea or vomiting.     tamsulosin (FLOMAX) 0.4 MG CAPS capsule Take 1 capsule (0.4 mg total) by mouth daily after supper. 30 capsule 0   traMADol (ULTRAM) 50 MG tablet Take 50 mg by mouth as needed.     No current facility-administered medications for this visit.    REVIEW OF SYSTEMS:   Review of Systems  Constitutional:  Positive for fatigue (75% energy). Negative for appetite change, chills, diaphoresis,  fever and unexpected weight change.  HENT:   Negative for lump/mass and nosebleeds.   Eyes:  Positive for eye problems (Mild left visual field deficit).  Respiratory:  Negative for cough, hemoptysis and shortness of breath.   Cardiovascular:  Negative for chest pain, leg swelling and palpitations.  Gastrointestinal:  Negative for abdominal pain, blood in stool, constipation, diarrhea, nausea and vomiting.  Genitourinary:  Negative for hematuria.   Skin: Negative.   Neurological:  Positive for speech difficulty (Mild word-finding difficulties). Negative for dizziness, headaches and light-headedness.  Hematological:  Does not bruise/bleed easily.     PHYSICAL EXAMINATION: ECOG PERFORMANCE STATUS: 1 - Symptomatic but completely ambulatory  There were no vitals filed for this visit. There were no vitals filed for this visit.  Physical Exam Constitutional:      Appearance: Normal appearance. He is obese.  HENT:     Head: Normocephalic and atraumatic.     Mouth/Throat:     Mouth: Mucous membranes are moist.  Eyes:     Extraocular Movements: Extraocular movements intact.      Pupils: Pupils are equal, round, and reactive to light.  Cardiovascular:     Rate and Rhythm: Normal rate and regular rhythm.     Pulses: Normal pulses.     Heart sounds: Normal heart sounds.  Pulmonary:     Effort: Pulmonary effort is normal.     Breath sounds: Normal breath sounds.  Abdominal:     General: Bowel sounds are normal.     Palpations: Abdomen is soft.     Tenderness: There is no abdominal tenderness.  Musculoskeletal:        General: No swelling.     Right lower leg: No edema.     Left lower leg: No edema.  Lymphadenopathy:     Cervical: No cervical adenopathy.  Skin:    General: Skin is warm and dry.  Neurological:     Mental Status: He is alert and oriented to person, place, and time.     Comments: Mild left visual field deficit.  No other focal abnormalities.  Psychiatric:        Mood and Affect: Mood normal.        Behavior: Behavior normal.      LABORATORY DATA:  I have reviewed the data as listed Recent Results (from the past 2160 hour(s))  Sedimentation rate     Status: None   Collection Time: 06/01/21  3:37 PM  Result Value Ref Range   Sed Rate 2 0 - 20 mm/h  ANA     Status: Abnormal   Collection Time: 06/01/21  3:37 PM  Result Value Ref Range   Anti Nuclear Antibody (ANA) POSITIVE (A) NEGATIVE    Comment: ANA IFA is a first line screen for detecting the presence of up to approximately 150 autoantibodies in various autoimmune diseases. A positive ANA IFA result is suggestive of autoimmune disease and reflexes to titer and pattern. Further laboratory testing may be considered if clinically indicated. . For additional information, please refer to http://education.[facility].com/faq/FAQ177 (This link is being provided for informational/ educational purposes only.) .   C-reactive protein     Status: None   Collection Time: 06/01/21  3:37 PM  Result Value Ref Range   CRP 1.0 <8.0 mg/L  Anti-nuclear ab-titer (ANA titer)     Status:  Abnormal   Collection Time: 06/01/21  3:37 PM  Result Value Ref Range   ANA Titer 1 1:80 (H) titer    Comment:  A low level ANA titer may be present in pre-clinical autoimmune diseases and normal individuals.                 Reference Range                 <1:40        Negative                 1:40-1:80    Low Antibody Level                 >1:80        Elevated Antibody Level .    ANA Pattern 1 Nuclear, Homogeneous (A)     Comment: Homogeneous pattern is associated with systemic lupus erythematosus (SLE), drug-induced lupus and juvenile idiopathic arthritis. . AC-1: Homogeneous . International Consensus on ANA Patterns ([email or web address])   Antiphospholopid Ab Panel     Status: Abnormal   Collection Time: 06/15/21  3:36 PM  Result Value Ref Range   INTERPRETATION see note     Comment: The antiphospholipid antibody syndrome (APS) is a clinical-pathologic correlation that includes a clinical event (e.g. arterial or venous thrombosis, pregnancy morbidity) and persistent positive anti- phospholipid antibodies (IgM or IgG ACA >40 MPL/GPL- U/mL, IgM or IgG anti-b2GPI antibodies or a lupus anti- coagulant). International consensus guidelines for APS suggest waiting at least 12 weeks before retesting to confirm antibody persistence. The Systemic Lupus International Collaborating Clinics immunological classification criteria for systemic lupus erythema- tosus (SLE) include testing for isotype IgA, which has yet to be incorporated into APS criteria. Low level antiphospholipid antibodies may sometimes be detected in the setting of infection, drug therapy or aging. For additional information, please refer to http://education.[facility].com/faq/FAQ109 (This link is being provided for informational/ educational purposes only.)    Beta-2 Glyco I IgG <2.0 <20.0 U/mL    Comment: . Value          Interpretation -----          -------------- < 20.0          Antibody not detected > or = 20.0    Antibody detected .    Beta-2 Glyco 1 IgA <2.0 <20.0 U/mL    Comment: . Value          Interpretation -----          -------------- < 20.0         Antibody not detected > or = 20.0    Antibody detected .    Beta-2 Glyco 1 IgM <2.0 <20.0 U/mL    Comment: . Value          Interpretation -----          -------------- < 20.0         Antibody not detected > or = 20.0    Antibody detected .    PHOSPHATIDYLSERINE AB (IGA) <20 <20 U/mL    Comment: . Phosphatidylserine (IgA) Reference range:   <20 U/mL  Negative 20-30 U/mL  Equivocal - Found in small percentage             of the healthy population; may be reactive   >30 U/mL  Positive - Risk factor for thrombosis .    PHOSPHATIDYLSERINE AB  (IGG) 14 (H) <10 U/mL    Comment: . Phosphatidylserine (IgG) Reference range:   <10 U/mL  Negative 10-20 U/mL  Equivocal - Found in small percentage             of the  healthy population; may be reactive   >20 U/mL  Positive - Risk factor for thrombosis             and pregnancy loss. Marland Kitchen    PHOSPHATIDYLSERINE AB  (IGM) <25 <25 U/mL    Comment: . Phosphatidylserine (IgM) Reference range:   <25 U/mL  Negative 25-35 U/mL  Equivocal - Found in small percentage             of the healthy population; may be reactive   >35 U/mL  Positive - Risk factor for thrombosis .    Anticardiolipin IgA <2.0 <20.0 APL-U/mL    Comment: . Value          Interpretation -----          -------------- < 20.0         Antibody not detected > or = 20.0    Antibody detected .    Anticardiolipin IgG <2.0 <20.0 GPL-U/mL    Comment: . Value          Interpretation -----          -------------- < 20.0         Antibody not detected > or = 20.0    Antibody detected .    Anticardiolipin IgM <2.0 <20.0 MPL-U/mL    Comment: . Value          Interpretation -----          -------------- < 20.0         Antibody not detected > or = 20.0    Antibody detected .   Anti-DNA  antibody, double-stranded     Status: None   Collection Time: 06/15/21  3:36 PM  Result Value Ref Range   ds DNA Ab 1 IU/mL    Comment:                            IU/mL       Interpretation                            < or = 4    Negative                            5-9         Indeterminate                            > or = 10   Positive .   Sm and Sm/RNP Antibodies     Status: None   Collection Time: 06/15/21  3:36 PM  Result Value Ref Range   ENA SM Ab Ser-aCnc <1.0 NEG <1.0 NEG AI   SM/RNP <1.0 NEG <1.0 NEG AI  Protein S activity     Status: None   Collection Time: 07/12/21 11:30 AM  Result Value Ref Range   Protein S Activity 102 63 - 140 %    Comment: (NOTE) Protein S activity may be falsely increased (masking an abnormal, low result) in patients receiving direct Xa inhibitor (e.g., rivaroxaban, apixaban, edoxaban) or a direct thrombin inhibitor (e.g., dabigatran) anticoagulant treatment due to assay interference by these drugs. Performed At: [facility] [address] [place], [place] [id number] Jolene Schimke MD JG:[id number]   Antithrombin III     Status: None   Collection Time: 07/12/21 11:30 AM  Result Value Ref Range   AntiThromb III Func 108 75 - 120 %    Comment: Performed at [facility], [address] [place], [place] [address]  Lupus anticoagulant panel     Status: Abnormal   Collection Time: 07/12/21 11:30 AM  Result Value Ref Range   PTT Lupus Anticoagulant 37.7 0.0 - 51.9 sec   DRVVT 53.6 (H) 0.0 - 47.0 sec   Lupus Anticoag Interp Comment:     Comment: (NOTE) No lupus anticoagulant was detected. These results are consistent with specific inhibitors to one or more common pathway factors (X, V, II or fibrinogen). Performed At: [facility] [address] [place], [place] [id number] Jolene Schimke MD XB:[id number]   Protein C, total     Status: Abnormal   Collection Time: 07/12/21 11:30 AM  Result Value Ref Range   Protein C,  Total 172 (H) 60 - 150 %    Comment: (NOTE) Performed At: [facility] [address] [place], [place] [id number] Jolene Schimke MD [id number]   Protein C activity     Status: None   Collection Time: 07/12/21 11:30 AM  Result Value Ref Range   Protein C Activity 159 73 - 180 %    Comment: (NOTE) Performed At: [facility] [address] [place], [place] [id number] Jolene Schimke MD [id number]   D-dimer, quantitative     Status: Abnormal   Collection Time: 07/12/21 11:30 AM  Result Value Ref Range   D-Dimer, Quant 0.76 (H) 0.00 - 0.50 ug/mL-FEU    Comment: (NOTE) At the manufacturer cut-off value of 0.5 g/mL FEU, this assay has a negative predictive value of 95-100%.This assay is intended for use in conjunction with a clinical pretest probability (PTP) assessment model to exclude pulmonary embolism (PE) and deep venous thrombosis (DVT) in outpatients suspected of PE or DVT. Results should be correlated with clinical presentation. Performed at [facility], [address] [place], [place] [address]   Factor 5 leiden     Status: None   Collection Time: 07/12/21 11:30 AM  Result Value Ref Range   Recommendations-F5LEID: Comment     Comment: (NOTE) Result: c.1601G>A (p.Arg534Gln) - Not Detected This result is not associated with an increased risk for venous thromboembolism. See Additional Clinical Information and Comments. Additional Clinical Information: Venous thromboembolism is a multifactorial disease influenced by genetic, environmental, and circumstantial risk factors. The c.1601G>A (p. Arg534Gln) variant in the F5 gene, commonly referred to as Factor V Leiden, is a genetic risk factor for venous thromboembolism. Heterozygous carriers of this variant have a 6- to 8- fold increased risk for venous thromboembolism. Individuals homozygous for this variant (ie, with a copy of the variant on each chromosome) have an approximately 80-fold increased risk  for venous thromboembolism. Individuals who carry both a c.*97G>A variant in the F2 gene and Factor V Leiden have an approximately 20-fold increased risk for venous thromboembolism. Risks are likely to be even higher in more complex genotype combinations in volving the F2 c.*97G>A variant and Factor V Leiden (PMID: 56433295). Additional risk factors include but are not limited to: deficiency of protein C, protein S, or antithrombin III, age, male sex, personal or family history of deep vein thromboembolism, smoking, surgery, prolonged immobilization, malignant neoplasm, tamoxifen treatment, raloxifene treatment, oral contraceptive use, hormone replacement therapy, and pregnancy. Management of thrombotic risk and thrombotic events should follow established guidelines and fit the clinical circumstance. This result cannot predict the occurrence or recurrence of a thrombotic event. Comment: Genetic  counseling is recommended to discuss the potential clinical implications of positive results, as well as recommendations for testing family members. Genetic Coordinators are available for health care providers to discuss results at [phone]). Test Details: Variant Analyzed: c.1601G>A (p. Arg534Gln), referred to as Fact or V Leiden Methods/Limitations: DNA analysis of the F5 gene (NM_000130.5) was performed by PCR amplification followed by restriction enzyme analysis. The diagnostic sensitivity is >99%. Results must be combined with clinical information for the most accurate interpretation. Molecular- based testing is highly accurate, but as in any laboratory test, diagnostic errors may occur. False positive or false negative results may occur for reasons that include genetic variants, blood transfusions, bone marrow transplantation, somatic or tissue-specific mosaicism, mislabeled samples, or erroneous representation of family relationships. This test was developed and its  performance characteristics determined by [facility]. It has not been cleared or approved by the Food and Drug Administration. References: Dewitt Hoes [facility], Valentina Lucks [facility]; ACMG Professional Practice and Guidelines Committee. Addendum: [employer] of Medical Genetics consensus statement on fac tor V Leiden mutation testing. Genet Med. 2021 Mar 5. doi: 11.9147/W29562-130- 01108-x. PMID: 86578469. Cherrie Gauze. Factor V Leiden Thrombophilia. 1999 May 14 [Updated 2018 Jan 4]. In: Bufford Buttner, Ardinger HH, Pagon RA, et al., editors. GeneReviews(R) [Internet]. [address] ([place]): [place], [place]; 6295-2841. Available from: https://harris-mcgee.org/ Nita Sickle, Blanca Friend, Alvie Heidelberg CS; ACMG Laboratory Quality Assurance Committee. Venous thromboembolism laboratory testing (factor V Leiden and factor II c.*97G>A), 2018 update: a technical standard of the [employer] of [employer] and [employer] (ACMG). Genet Med. 2018 Dec;20(12):1489-1498. doi: 10.1038/s41436-2123396907-z. Epub 2018 Oct 5. PMID: 32440102. Ernestene Mention, PhD, [facility] Alpha Gula, PhD, [facility] Lenis Dickinson, PhD, [facility] Elgie Collard, PhD, [facility] Harlan Stains, PhD, [facility] Elwyn Lade, PhD, [facility] Manya Silvas, PhD, [facility] Perform ed At: [facility] [address] [place], [place] [id number] Maurine Simmering MDPhD [id number]   Beta-2-glycoprotein i abs, IgG/M/A     Status: None   Collection Time: 07/12/21 11:30 AM  Result Value Ref Range   Beta-2 Glyco I IgG <9 0 - 20 GPI IgG units    Comment: (NOTE) The reference interval reflects a 3SD or 99th percentile interval, which is thought to represent a potentially clinically significant result in accordance with the International Consensus Statement on the classification criteria for definitive antiphospholipid syndrome (APS). J Thromb Haem 2006;4:295-306.    Beta-2-Glycoprotein I IgM  <9 0 - 32 GPI IgM units    Comment: (NOTE) The reference interval reflects a 3SD or 99th percentile interval, which is thought to represent a potentially clinically significant result in accordance with the International Consensus Statement on the classification criteria for definitive antiphospholipid syndrome (APS). J Thromb Haem 2006;4:295-306. Performed At: [facility] [address] [place], [place] [id number] Jolene Schimke MD [id number]    Beta-2-Glycoprotein I IgA <9 0 - 25 GPI IgA units    Comment: (NOTE) The reference interval reflects a 3SD or 99th percentile interval, which is thought to represent a potentially clinically significant result in accordance with the International Consensus Statement on the classification criteria for definitive antiphospholipid syndrome (APS). J Thromb Haem 2006;4:295-306.   Cardiolipin antibodies, IgG, IgM, IgA     Status: None   Collection Time: 07/12/21 11:30 AM  Result Value Ref Range   Anticardiolipin IgG 13 0 - 14 GPL U/mL    Comment: (NOTE)  Negative:              <15                          Indeterminate:     15 - 20                          Low-Med Positive: >20 - 80                          High Positive:         >80    Anticardiolipin IgM <9 0 - 12 MPL U/mL    Comment: (NOTE)                          Negative:              <13                          Indeterminate:     13 - 20                          Low-Med Positive: >20 - 80                          High Positive:         >80    Anticardiolipin IgA <9 0 - 11 APL U/mL    Comment: (NOTE)                          Negative:              <12                          Indeterminate:     12 - 20                          Low-Med Positive: >20 - 80                          High Positive:         >80 Performed At: [facility] [address] [place], [place] [id number] Jolene SchimkeNagendra Sanjai MD [phone]   dRVVT Mix     Status:  Abnormal   Collection Time: 07/12/21 11:30 AM  Result Value Ref Range   dRVVT Mix 46.8 (H) 0.0 - 40.4 sec    Comment: (NOTE) Performed At: [facility] [place] [address] [place], [place] [id number] Jolene SchimkeNagendra Sanjai MD ON:[phone]   dRVVT Confirm     Status: None   Collection Time: 07/12/21 11:30 AM  Result Value Ref Range   dRVVT Confirm 1.1 0.8 - 1.2 ratio    Comment: (NOTE) [facility]formed At: [facility] [place] [address] [address] [place], [place] [id number] Jolene SchimkeNagendra Sanjai MD DG:[phone]   ECHOCARDIOGRAM COMPLETE     Status: None   Collection Time: 07/14/21  3:35 PM  Result Value Ref Range   Area-P 1/2 2.87 cm2   S' Lateral 3.40 cm   Ao pk vel 3.70 m/s   AV Mean grad 28.7 mmHg   AV Peak grad 54.9 mmHg  RADIOGRAPHIC STUDIES: I have personally reviewed the radiological images as listed and agreed with the findings in the report. MR BRAIN W WO CONTRAST  Result Date: 07/11/2021 CLINICAL DATA:  Left-sided weakness and numbness for 2 weeks. EXAM: MRI HEAD WITHOUT AND WITH CONTRAST TECHNIQUE: Multiplanar, multiecho pulse sequences of the brain and surrounding structures were obtained without and with intravenous contrast. CONTRAST:  9mL GADAVIST GADOBUTROL 1 MMOL/ML IV SOLN COMPARISON:  05/20/2021 FINDINGS: Brain: There is an approximately 2 cm focus of trace diffusion weighted signal hyperintensity with slightly reduced ADC and mild enhancement in the right corona radiata consistent with an early subacute infarct. Small T2 hyperintensities elsewhere in the cerebral white matter bilaterally are unchanged and nonspecific but compatible with mild chronic small vessel ischemic disease. No intracranial hemorrhage, mass, midline shift, or extra-axial fluid collection is identified. Small chronic infarcts involving the right greater than left cerebellar hemispheres are unchanged. Mild cerebral atrophy is within normal limits for age. Vascular: Major intracranial vascular flow voids are  preserved. Skull and upper cervical spine: Unremarkable bone marrow signal. Sinuses/Orbits: Unremarkable orbits. Mild scattered mucosal thickening in the paranasal sinuses. Unchanged mucous retention cyst in the left maxillary sinus. Clear mastoid air cells. Other: None. IMPRESSION: 1. Early subacute infarct in the right corona radiata. 2. Mild chronic small vessel ischemic disease. 3. Chronic cerebellar infarcts. These results will be called to the ordering clinician or representative by the Radiologist Assistant, and communication documented in the PACS or [employer]. Electronically Signed   By: Sebastian Ache M.D.   On: 07/11/2021 13:22   US Carotid Bilateral  Result Date: 07/03/2021 CLINICAL DATA:  Recent stroke.  Hypertension, visual disturbance EXAM: BILATERAL CAROTID DUPLEX ULTRASOUND TECHNIQUE: Wallace Cullens scale imaging, color Doppler and duplex ultrasound were performed of bilateral carotid and vertebral arteries in the neck. COMPARISON:  None. FINDINGS: Criteria: Quantification of carotid stenosis is based on velocity parameters that correlate the residual internal carotid diameter with NASCET-based stenosis levels, using the diameter of the distal internal carotid lumen as the denominator for stenosis measurement. The following velocity measurements were obtained: RIGHT ICA: 52/18 cm/sec CCA: 74/18 cm/sec SYSTOLIC ICA/CCA RATIO:  0.7 ECA: 60 cm/sec LEFT ICA: 85/29 cm/sec CCA: 98/25 cm/sec SYSTOLIC ICA/CCA RATIO:  0.9 ECA: 58 cm/sec RIGHT CAROTID ARTERY: Mild tortuosity. Diffuse intimal thickening. No focal plaque accumulation or stenosis. Normal waveforms and color Doppler signal throughout. High bifurcation. RIGHT VERTEBRAL ARTERY:  Normal flow direction and waveform. LEFT CAROTID ARTERY: Mild tortuosity. Mild diffuse intimal thickening. No focal plaque accumulation or stenosis. Normal waveforms and color Doppler signal throughout. High bifurcation. LEFT VERTEBRAL ARTERY:  Normal flow direction and  waveform. IMPRESSION: 1. Bilateral carotid intimal thickening but no focal plaque accumulation or stenosis. 2.  Antegrade bilateral vertebral arterial flow. Electronically Signed   By: Corlis Leak M.D.   On: 07/03/2021 16:20   ECHOCARDIOGRAM COMPLETE  Result Date: 07/14/2021    ECHOCARDIOGRAM REPORT   Patient Name:   MARKE GOODWYN Date of Exam: 07/14/2021 Medical Rec #:  [id number]       Height:       65.0 in Accession #:    [id number]      Weight:       186.0 lb Date of Birth:  1956-11-01       BSA:          1.918 m Patient Age:    65 years        BP:  140/90 mmHg Patient Gender: M               HR:           67 bpm. Exam Location:  [address] Procedure: 2D Echo, Color Doppler, Cardiac Doppler and Strain Analysis Indications:    Z95.2 AVR  History:        Patient has prior history of Echocardiogram examinations, most                 recent 06/17/2020. Aortic Valve Disease, Arrythmias:PSVT; Risk                 Factors:Hypertension and Sleep Apnea. Bentall procedure.                 Aortic Valve: 23 mm Edwards Magna-Ease and 26cm Gelweave                 valsalva graft valve is present in the aortic position.                 Procedure Date: 12/22/18.  Sonographer:    Garald Braver, RDCS Referring Phys: [address]  Sonographer Comments: Technically difficult study due to poor echo windows and suboptimal apical window. Image acquisition challenging due to patient body habitus. IMPRESSIONS  1. Left ventricular ejection fraction, by estimation, is 60 to 65%. The left ventricle has normal function. The left ventricle has no regional wall motion abnormalities. Left ventricular diastolic parameters were normal.  2. Right ventricular systolic function is normal. The right ventricular size is normal.  3. Left atrial size was moderately dilated.  4. The mitral valve is normal in structure. Trivial mitral valve regurgitation. No evidence of mitral stenosis.  5. 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 The aortic valve has been repaired/replaced. Aortic valve regurgitation is not visualized. No aortic stenosis is present. There is a 23 mm Edwards Magna-Ease and 26cm Gelweave valsalva graft valve present in the aortic position. Procedure Date: 12/22/18.  6. The inferior vena cava is normal in size with greater than 50% respiratory variability, suggesting right atrial pressure of 3 mmHg. Comparison(s): 06/17/20 EF 60-65%. AV mean PG, peak PG. FINDINGS  Left Ventricle: Left ventricular ejection fraction, by estimation, is 60 to 65%. The left ventricle has normal function. The left ventricle has no regional wall motion abnormalities. Definity contrast agent was given IV to delineate the left ventricular  endocardial borders. The left ventricular internal cavity size was normal in size. There is no left ventricular hypertrophy. Left ventricular diastolic parameters were normal. Right Ventricle: The right ventricular size is normal. No increase in right ventricular wall thickness. Right ventricular systolic function is normal. Left Atrium: Left atrial size was moderately dilated. Right Atrium: Right atrial size was normal in size. Pericardium: There is no evidence of pericardial effusion. Mitral Valve: The mitral valve is normal in structure. Trivial mitral valve regurgitation. No evidence of mitral valve stenosis. Tricuspid Valve: The tricuspid valve is normal in structure. Tricuspid valve regurgitation is mild . No evidence of tricuspid stenosis. Aortic Valve: Post AVR with root replacement 23 mm [facility] Ease tissue valve mean gradient has increased from 12.5 mm->26.7 mmHg, peak from 23 mmHg ->54.9 mmHg reduction in DVI 0.67-> 0.31 Leaflets  appear calcified  Consider f/u cardiac CTA to r/o HAM/HALT and benefit from anticoagulation. The aortic valve has been repaired/replaced. Aortic valve regurgitation is not visualized. No aortic stenosis is present. Aortic valve mean gradient measures 28.7 mmHg. Aortic valve peak gradient measures 54.9 mmHg. There is a 23 mm Edwards Magna-Ease and 26cm Gelweave valsalva graft valve present in the aortic position. Procedure Date: 12/22/18. Pulmonic Valve: The pulmonic valve was normal in structure. Pulmonic valve regurgitation is not visualized. No evidence of pulmonic stenosis. Aorta: The aortic root is normal in size and structure. Venous: The inferior vena cava is normal in size with greater than 50% respiratory variability, suggesting right atrial pressure of 3 mmHg. IAS/Shunts: No atrial level shunt detected by color flow Doppler.  LEFT VENTRICLE PLAX 2D LVIDd:         5.00 cm Diastology LVIDs:         3.40 cm LV e' medial:    7.87 cm/s LV PW:         1.00 cm LV E/e' medial:  13.9 LV IVS:        1.00 cm LV e' lateral:   14.30 cm/s                        LV E/e' lateral: 7.6  RIGHT VENTRICLE RV Basal diam:  3.60 cm RV S prime:     8.59 cm/s TAPSE (M-mode): 1.5 cm LEFT ATRIUM             Index       RIGHT ATRIUM           Index LA diam:        4.90 cm 2.55 cm/m  RA Pressure: 3.00 mmHg LA Vol (A2C):   59.0 ml 30.76 ml/m RA Area:     14.70 cm LA Vol (A4C):   45.6 ml 23.77 ml/m RA Volume:   32.40 ml  16.89 ml/m LA Biplane Vol: 52.1 ml 27.16 ml/m  AORTIC VALVE AV Vmax:           370.33 cm/s AV Vmean:          226.750 cm/s AV VTI:            0.729 m AV Peak Grad:      54.9 mmHg AV Mean Grad:      28.7 mmHg LVOT Vmax:         110.00 cm/s LVOT Vmean:        68.600 cm/s LVOT VTI:          0.228 m LVOT/AV VTI ratio: 0.31  AORTA Ao Root diam: 3.70 cm Ao Asc diam:  3.70 cm MITRAL VALVE                TRICUSPID VALVE                             Estimated RAP:  3.00 mmHg  MV E velocity: 109.00 cm/s  SHUNTS MV A velocity: 116.00 cm/s   Systemic VTI: 0.23 m MV E/A ratio:  0.94 Charlton Haws MD Electronically signed by Charlton Haws MD Signature Date/Time: 07/14/2021/4:09:37 PM    Final     ASSESSMENT & PLAN: 1.  Recent CVA with concern for possible hypercoagulable state - Referred by PCP in the course of work-up for recent CVA - PCP requested hypercoagulable work-up to determine if this could be the etiology of the patient's stroke -  No personal or family history of blood clots or miscarriages.  No personal history of cancer. - Family history positive for lung cancer in father and CVA in mother. - Labs obtained on 07/12/2021 showed no overt evidence of hypercoagulable state:  No protein C or protein S deficiencies Antithrombin III is normal No lupus anticoagulant was detected, but DRVVT was mildly elevated at 53.6, which is consistent with specific inhibitor to 1 or more common pathway factors (X, V, II, or fibrinogen); dRVVT mix mildly elevated at 46.8, dRVVT confirm was normal at 1.1 - this was likely a mild elevation secondary to Eliquis being started the day before labs were drawn.  This is clinically insignificant in light of negative anticardiolipin and antibeta-2 glycoprotein antibodies. Beta-2 glycoprotein antibodies negative Cardiolipin antibodies negative D-dimer mildly elevated at 0.76 Factor V Leiden negative Prothrombin gene mutation pending  - PLAN: No evidence of hypercoagulable state.  No indication for continued work-up or follow-up with hematology at this time.  Recommend continued work-up with neurology and cardiology to determine etiology of stroke.  Recommend lifestyle modifications including weight loss and blood pressure control.  From a hematology standpoint, there is no indication for chronic anticoagulation, but we will defer the final decision on this to cardiology and neurology, pending completion of their work-up.     PLAN SUMMARY & DISPOSITION: - No indication for further hematology work-up or  follow-up. - Prothrombin gene mutation pending - will call with results  All questions were answered. The patient knows to call the clinic with any problems, questions or concerns.   Medical decision making: Moderate   Time spent on visit: I spent 35 minutes counseling the patient face to face. The total time spent in the appointment was 55 minutes and more than 50% was on counseling.  I, Rojelio Brenner PA-C, have seen this patient in conjunction with Dr. Artis Delay.  Greater than 50% of visit was performed by Dr. Bertis Ruddy.     Carnella Guadalajara, PA-C 07/19/2021 10:13 AM  I have seen the patient, review his records independently, added that the documentation above and discussed management with the patient and his wife I suspect the patient might have small vessel disease secondary to other cardiovascular risk factors including hypertension and obesity The patient has mitral valve replacement therapy Initial echocardiogram did not show any vegetation; this is unlikely to be the source of his stroke The patient has failed treatment with 81 mg aspirin I think it is appropriate to recommend chronic anticoagulation therapy with Eliquis indefinitely for secondary prevention His lupus anticoagulant panel is not consistent with presence of antiphospholipid antibody syndrome That does not need to be repeated; it will not change medical therapy He does not need long-term hematology follow-up I have addressed all his questions and concerns  Artis Delay, MD

## 2021-07-19 ENCOUNTER — Other Ambulatory Visit: Payer: Self-pay

## 2021-07-19 ENCOUNTER — Encounter (HOSPITAL_COMMUNITY): Payer: Self-pay | Admitting: Hematology and Oncology

## 2021-07-19 ENCOUNTER — Inpatient Hospital Stay (HOSPITAL_BASED_OUTPATIENT_CLINIC_OR_DEPARTMENT_OTHER): Payer: Commercial Managed Care - PPO | Admitting: Hematology and Oncology

## 2021-07-19 VITALS — BP 143/75 | HR 72 | Temp 96.9°F | Resp 18 | Ht 65.0 in | Wt 188.5 lb

## 2021-07-19 DIAGNOSIS — I639 Cerebral infarction, unspecified: Secondary | ICD-10-CM

## 2021-07-19 DIAGNOSIS — Z7901 Long term (current) use of anticoagulants: Secondary | ICD-10-CM

## 2021-07-19 DIAGNOSIS — Z823 Family history of stroke: Secondary | ICD-10-CM

## 2021-07-19 DIAGNOSIS — R760 Raised antibody titer: Secondary | ICD-10-CM

## 2021-07-19 DIAGNOSIS — Z801 Family history of malignant neoplasm of trachea, bronchus and lung: Secondary | ICD-10-CM

## 2021-07-19 LAB — PROTHROMBIN GENE MUTATION

## 2021-07-19 NOTE — Progress Notes (Signed)
Please call patient to let him no that his prothrombin gene mutation was negative. This confirms what we told him during his appointment - no evidence of hypercoagulable state as the cause of his stroke.  No further hematology tests, treatment, or follow-up needed at this time.

## 2021-07-19 NOTE — Patient Instructions (Signed)
[facility] at [facility] Discharge Instructions  You were seen today by Dr. Bertis Ruddy & Rojelio Brenner PA-C for your recent stroke.  At the request of your primary care doctor, we ran some blood tests to see if you had any underlying blood abnormalities that would predispose you to blood clots and strokes.  We are still waiting on one test result to come back (prothrombin gene mutation), but your other tests do not show any significant hypercoagulable state that would cause an increased risk of blood clots or strokes.  (We will call you with the results of your prothrombin gene mutation once it is back.)  From a hematology standpoint, there are no blood abnormalities that need further work-up or treatment.  We do not need to see you again for this condition.  However, your other specialists (cardiology, neurology, primary care), may determine that you need lifelong anticoagulation for other reasons unrelated to hematology.  We will defer to their expertise in this area.  If any future needs for hematology or oncology services arise in the future, we would be happy to see you at that time.  Otherwise, we wish you health and wellness and continued recovery!  _____________________________________________________________   Thank you for choosing [facility] at [facility] to provide your oncology and hematology care.  To afford each patient quality time with our provider, please arrive at least 15 minutes before your scheduled appointment time.   If you have a lab appointment with the Cancer Center please come in thru the Main Entrance and check in at the main information desk.  You need to re-schedule your appointment should you arrive 10 or more minutes late.  We strive to give you quality time with our providers, and arriving late affects you and other patients whose appointments are after yours.  Also, if you no show three or more times for  appointments you may be dismissed from the clinic at the providers discretion.     Again, thank you for choosing [facility].  Our hope is that these requests will decrease the amount of time that you wait before being seen by our physicians.       _____________________________________________________________  Should you have questions after your visit to [facility], please contact our office at [phone] and follow the prompts.  Our office hours are 8:00 a.m. and 4:30 p.m. Monday - Friday.  Please note that voicemails left after 4:00 p.m. may not be returned until the following business day.  We are closed weekends and major holidays.  You do have access to a nurse 24-7, just call the main number to the clinic [phone] and do not press any options, hold on the line and a nurse will answer the phone.    For prescription refill requests, have your pharmacy contact our office and allow 72 hours.    Due to Covid, you will need to wear a mask upon entering the hospital. If you do not have a mask, a mask will be given to you at the Main Entrance upon arrival. For doctor visits, patients may have 1 support person age 60 or older with them. For treatment visits, patients can not have anyone with them due to social distancing guidelines and our immunocompromised population.

## 2021-07-20 NOTE — Progress Notes (Signed)
Patients wife aware

## 2021-07-21 ENCOUNTER — Telehealth (HOSPITAL_COMMUNITY): Payer: Self-pay | Admitting: Emergency Medicine

## 2021-07-21 NOTE — Telephone Encounter (Signed)
Reaching out to patient to offer assistance regarding upcoming cardiac imaging study; pt verbalizes understanding of appt date/time, parking situation and where to check in, pre-test NPO status and medications ordered, and verified current allergies; name and call back number provided for further questions should they arise Rockwell Alexandria RN Navigator Cardiac Imaging [facility] [phone] office [phone] cell  Denies claustro Denies iv issues 25mg  metop succ daily Wife will bring lab report from [facility]  07/18/21 - creat 1.01/ GFR 84

## 2021-07-26 ENCOUNTER — Other Ambulatory Visit: Payer: Self-pay

## 2021-07-26 ENCOUNTER — Ambulatory Visit (HOSPITAL_COMMUNITY)
Admission: RE | Admit: 2021-07-26 | Discharge: 2021-07-26 | Disposition: A | Discharge status: Home / Self Care | Payer: Commercial Managed Care - PPO | Source: Ambulatory Visit | Attending: Cardiology | Admitting: Cardiology

## 2021-07-26 ENCOUNTER — Other Ambulatory Visit (HOSPITAL_COMMUNITY): Payer: Self-pay | Admitting: Cardiology

## 2021-07-26 DIAGNOSIS — R0789 Other chest pain: Secondary | ICD-10-CM | POA: Diagnosis present

## 2021-07-26 DIAGNOSIS — Z952 Presence of prosthetic heart valve: Secondary | ICD-10-CM | POA: Diagnosis present

## 2021-07-26 MED ORDER — NITROGLYCERIN 0.4 MG SL SUBL: 0.8000 mg | SUBLINGUAL_TABLET | Freq: Once | SUBLINGUAL | Status: AC

## 2021-07-26 MED ORDER — NITROGLYCERIN 0.4 MG SL SUBL
SUBLINGUAL_TABLET | SUBLINGUAL | Status: AC
  Administered 2021-07-26: 0.8 mg via SUBLINGUAL
  Filled 2021-07-26: qty 2

## 2021-07-26 MED ORDER — IOHEXOL 350 MG/ML SOLN
100.0000 mL | Freq: Once | INTRAVENOUS | Status: AC | PRN
  Administered 2021-07-26: 100 mL via INTRAVENOUS

## 2021-07-28 ENCOUNTER — Other Ambulatory Visit: Payer: Self-pay | Admitting: *Deleted

## 2021-07-28 DIAGNOSIS — T8209XA Other mechanical complication of heart valve prosthesis, initial encounter: Secondary | ICD-10-CM

## 2021-07-31 ENCOUNTER — Other Ambulatory Visit: Payer: Self-pay | Admitting: *Deleted

## 2021-07-31 DIAGNOSIS — Z952 Presence of prosthetic heart valve: Secondary | ICD-10-CM

## 2021-08-08 ENCOUNTER — Telehealth: Payer: Self-pay

## 2021-08-08 DIAGNOSIS — I10 Essential (primary) hypertension: Secondary | ICD-10-CM

## 2021-08-08 DIAGNOSIS — I472 Ventricular tachycardia: Secondary | ICD-10-CM

## 2021-08-08 DIAGNOSIS — I4729 Other ventricular tachycardia: Secondary | ICD-10-CM

## 2021-08-08 NOTE — Telephone Encounter (Signed)
   Cardiac Monitor Alert  Date of alert:  08/08/2021   Patient Name: Cameron [name]  DOB: November 12, 1956  MRN: [id number]   [facility] Cardiologist: Rollene Rotunda, MD  [facility] EP:  None    Monitor Information: Cardiac Event Monitor [[employer]]  Reason:  CVA, A fib Ordering provider:  Dr. Antoine Poche  Alert Ventricular Tachycardia (11 sec) This is the 1st alert for this rhythm.   Next Cardiology Appointment   Recall for 6/6/223 with Dr. Antoine Poche  The patient could NOT be reached by telephone today.  08/08/21 Arrhythmia, symptoms and history reviewed with Dr. Bjorn Pippin (DOD).   Plan: Schedule sooner follow up appointment  Left message for pt to call back    Parke Poisson, RN  08/08/2021 10:29 AM

## 2021-08-08 NOTE — Telephone Encounter (Signed)
Spoke to pt regarding critical monitor report. Pt state he had no symptoms and feel overall well. Pt made aware of DOD recommendations. Pt would prefer to complete all test prior to appointment. Appointment scheduled for 9/22 with Hochrein.

## 2021-08-09 ENCOUNTER — Telehealth (HOSPITAL_COMMUNITY): Payer: Self-pay | Admitting: Emergency Medicine

## 2021-08-09 NOTE — Telephone Encounter (Signed)
Reaching out to patient to offer assistance regarding upcoming cardiac imaging study; pt verbalizes understanding of appt date/time, parking situation and where to check in, pre-test NPO status and medications ordered, and verified current allergies; name and call back number provided for further questions should they arise Rockwell Alexandria RN Navigator Cardiac Imaging [facility] [phone] office [phone] cell  Spoke with patients wife who understands this is a repeat exam as the first was deemed non-diagnostic. I explained that the non-diagnostic scan charge was deleted and he will be charged for the repeat exam only.   He is to take his daily meds per usual

## 2021-08-09 NOTE — Telephone Encounter (Signed)
MyChart communication successful - labs orders/faxed

## 2021-08-09 NOTE — Addendum Note (Signed)
Addended by: Lindell Spar on: 08/09/2021 09:26 AM   Modules accepted: Orders

## 2021-08-09 NOTE — Addendum Note (Signed)
Addended by: Lindell Spar on: 08/09/2021 02:26 PM   Modules accepted: Orders

## 2021-08-09 NOTE — Telephone Encounter (Signed)
Rollene Rotunda, MD  Parke Poisson, RN; P Cv Div Nl Triage Caller: Unspecified (Yesterday, 10:28 AM) He needs a BMET and magnesium please.     Attempted to call patient to provide MD orders/advice Unable to reach [employer] ordered

## 2021-08-10 ENCOUNTER — Encounter (HOSPITAL_COMMUNITY): Payer: Self-pay

## 2021-08-10 ENCOUNTER — Other Ambulatory Visit: Payer: Self-pay

## 2021-08-10 ENCOUNTER — Ambulatory Visit (HOSPITAL_COMMUNITY)
Admission: RE | Admit: 2021-08-10 | Discharge: 2021-08-10 | Disposition: A | Discharge status: Home / Self Care | Payer: Commercial Managed Care - PPO | Source: Ambulatory Visit | Attending: Cardiology | Admitting: Cardiology

## 2021-08-10 DIAGNOSIS — T8209XA Other mechanical complication of heart valve prosthesis, initial encounter: Secondary | ICD-10-CM | POA: Insufficient documentation

## 2021-08-10 MED ORDER — NITROGLYCERIN 0.4 MG SL SUBL
0.8000 mg | SUBLINGUAL_TABLET | Freq: Once | SUBLINGUAL | Status: AC
  Administered 2021-08-10: 0.8 mg via SUBLINGUAL

## 2021-08-10 MED ORDER — IOHEXOL 350 MG/ML SOLN
95.0000 mL | Freq: Once | INTRAVENOUS | Status: AC | PRN
  Administered 2021-08-10: 95 mL via INTRAVENOUS

## 2021-08-10 MED ORDER — NITROGLYCERIN 0.4 MG SL SUBL
SUBLINGUAL_TABLET | SUBLINGUAL | Status: AC
  Filled 2021-08-10: qty 2

## 2021-08-11 ENCOUNTER — Telehealth (HOSPITAL_COMMUNITY): Payer: Self-pay | Admitting: *Deleted

## 2021-08-11 NOTE — Telephone Encounter (Signed)
Left voice with Doug Mast telephone number for wife to call if concerns regarding being billed for [address].  Her husband had to have repeat scan due to an issue on radiology side.

## 2021-08-18 LAB — MAGNESIUM: Magnesium: 2.1 mg/dL (ref 1.5–2.5)

## 2021-08-18 LAB — BASIC METABOLIC PANEL
BUN: 14 mg/dL (ref 7–25)
CO2: 25 mmol/L (ref 20–32)
Calcium: 9.1 mg/dL (ref 8.6–10.3)
Chloride: 104 mmol/L (ref 98–110)
Creat: 1.12 mg/dL (ref 0.70–1.35)
Glucose, Bld: 106 mg/dL (ref 65–139)
Potassium: 3.8 mmol/L (ref 3.5–5.3)
Sodium: 138 mmol/L (ref 135–146)

## 2021-09-13 NOTE — Progress Notes (Signed)
Cardiology Office Note   Date:  09/14/2021   ID:  Cameron [name], DOB 10/11/1956, MRN [id number]  PCP:  [name], MD  Cardiologist:   Rollene Rotunda, MD   Chief Complaint  Patient presents with   Follow-up    Monitor        History of Present Illness: Cameron [name] is a 65 y.o. 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    This was functioning normally on echo last year.   However, he subsequently had a TIA.  There was some nonischemic changes on MRI.  There were no significant findings on MRA.  Follow-up echo in July demonstrated increased aortic valve pressure gradient.  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  He had minimal LAD plaque.  He has since been started on Eliquis.  He has recovered from both a TIA and a subsequent stroke thought to be hypertensive urgency.  He has had his blood pressure managed.  He is done some physical therapy.  He denies any chest pressure, neck or arm discomfort.  He has had no new palpitations, presyncope or syncope.  He has had no weight gain or edema.  As a follow-up of his TIA I did order an event monitor which demonstrated no atrial fibrillation but did have an 11-second run of ventricular tachycardia.  He has normal coronaries and normal LV.  I check some blood work and his potassium and magnesium were normal.  There were no other significant abnormalities.  He did not feel any of this.   Past Medical History:  Diagnosis Date   Aortic stenosis, severe    Arthritis    Ascending aortic aneurysm (HCC)    GERD (gastroesophageal reflux disease)    Headache    History of heart murmur in childhood    History of kidney stones    Hyperthyroidism    Impaired glucose tolerance    Nephrolithiasis    Sleep apnea    On CPAP    Past Surgical History:  Procedure Laterality Date   BENTALL PROCEDURE N/A 12/22/2018   Procedure: BENTALL PROCEDURE;   Surgeon: Alleen Borne, MD;  Location: [facility] OR;  Service: Open Heart Surgery;  Laterality: N/A;  NEEDS BILATERAL RADIAL ARTERIAL LINES   CARDIAC CATHETERIZATION  1990s   CYSTOSCOPY  1990s   With stone extraction   CYSTOSCOPY W/ URETERAL STENT PLACEMENT Left 12/02/2015   Procedure: CYSTOSCOPY WITH RETROGRADE PYELOGRAM/URETERAL STENT PLACEMENT;  Surgeon: Malen Gauze, MD;  Location: [facility] ORS;  Service: Urology;  Laterality: Left;   ESOPHAGOGASTRODUODENOSCOPY  1990s   KNEE ARTHROSCOPY Left 2005   LITHOTRIPSY  1990s   LITHOTRIPSY Left 11/2015   REPLACEMENT ASCENDING AORTA N/A 12/22/2018   Procedure: REPLACEMENT ASCENDING AORTA;  Surgeon: Alleen Borne, MD;  Location: [facility] OR;  Service: Open Heart Surgery;  Laterality: N/A;   RIGHT/LEFT HEART CATH AND CORONARY ANGIOGRAPHY N/A 12/09/2018   Procedure: RIGHT/LEFT HEART CATH AND CORONARY ANGIOGRAPHY;  Surgeon: Corky Crafts, MD;  Location: [facility] INVASIVE CV LAB;  Service: Cardiovascular;  Laterality: N/A;   TEE WITHOUT CARDIOVERSION N/A 12/22/2018   Procedure: TRANSESOPHAGEAL ECHOCARDIOGRAM (TEE);  Surgeon: Alleen Borne, MD;  Location: [facility] OR;  Service: Open Heart Surgery;  Laterality: N/A;   ULTRASOUND GUIDANCE FOR VASCULAR ACCESS  12/09/2018   Procedure: Ultrasound Guidance For Vascular Access;  Surgeon: Corky Crafts, MD;  Location: [facility] INVASIVE CV LAB;  Service:  Cardiovascular;;   UMBILICAL HERNIA REPAIR  late 1990s     Current Outpatient Medications  Medication Sig Dispense Refill   acetaminophen (TYLENOL) 500 MG tablet Take 1,000 mg by mouth every 6 (six) hours as needed (pain).     amLODipine (NORVASC) 5 MG tablet Take 5 mg by mouth 2 (two) times daily.     apixaban (ELIQUIS) 5 MG TABS tablet Take 5 mg by mouth 2 (two) times daily.     cholecalciferol (VITAMIN D3) 25 MCG (1000 UNIT) tablet Take 2,000 Units by mouth daily.     metoprolol succinate (TOPROL-XL) 25 MG 24 hr tablet TAKE 1 TABLET BY MOUTH ONCE DAILY. (Patient  taking differently: Take 25 mg by mouth daily.) 90 tablet 0   tamsulosin (FLOMAX) 0.4 MG CAPS capsule Take 1 capsule (0.4 mg total) by mouth daily after supper. 30 capsule 0   traMADol (ULTRAM) 50 MG tablet Take 50 mg by mouth as needed.     HYDROcodone-acetaminophen (NORCO) 10-325 MG tablet Take 1 tablet by mouth 4 (four) times daily as needed. (Patient not taking: Reported on 09/14/2021)     promethazine (PHENERGAN) 25 MG tablet Take 25 mg by mouth every 6 (six) hours as needed for nausea or vomiting. (Patient not taking: Reported on 09/14/2021)     No current facility-administered medications for this visit.    Allergies:   Tramadol hcl, Demerol [meperidine], and Stadol [butorphanol]    ROS:  Please see the history of present illness.   Otherwise, review of systems are positive for none.   All other systems are reviewed and negative.    PHYSICAL EXAM: VS:  BP 130/72 (BP Location: Left Arm, Patient Position: Sitting, Cuff Size: Normal)   Pulse 69   Ht 5\' 6"  (1.676 m)   Wt 186 lb (84.4 kg)   BMI 30.02 kg/m  , BMI Body mass index is 30.02 kg/m. GENERAL:  Well appearing NECK:  No jugular venous distention, waveform within normal limits, carotid upstroke brisk and symmetric, no bruits, no thyromegaly LUNGS:  Clear to auscultation bilaterally CHEST:  Unremarkable HEART:  PMI not displaced or sustained,S1 and S2 within normal limits, no S3, no S4, no clicks, no rubs, 3 out of 6 apical systolic murmur radiating out the aortic outflow tract, no diastolic murmurs ABD:  Flat, positive bowel sounds normal in frequency in pitch, no bruits, no rebound, no guarding, no midline pulsatile mass, no hepatomegaly, no splenomegaly EXT:  2 plus pulses throughout, no edema, no cyanosis no clubbing   EKG:  EKG is not ordered today.    Recent Labs: 08/18/2021: BUN 14; Creat 1.12; Magnesium 2.1; Potassium 3.8; Sodium 138    Lipid Panel No results found for: CHOL, TRIG, HDL, CHOLHDL, VLDL, LDLCALC,  LDLDIRECT    Wt Readings from Last 3 Encounters:  09/14/21 186 lb (84.4 kg)  07/19/21 188 lb 8 oz (85.5 kg)  07/18/21 189 lb (85.7 kg)      Other studies Reviewed: Additional studies/ records that were reviewed today include: Imaging Review of the above records demonstrates:  Please see elsewhere in the note.     ASSESSMENT AND PLAN:  AVR:  CT suggested chronic thrombus.  He had a significant gradient.   He is now on anticoagulation.  Plan on repeating an echo in February.  AORTIC ANEURYSM REPAIR:   He had stable repair on CT In August.     HTN:  The blood pressure is at target.  He will continue the meds as listed.  NSVT:     He has had no new symptoms.  He has not felt any palpitations and had normal electrolytes, no obstructive coronary disease on recent imaging and his previous catheterization and normal LV function.   ELEVATED CORONARY CALCIUM: He will be managed with risk reduction.  SLEEP APNEA: He uses CPAP.  Current medicines are reviewed at length with the patient today.  The patient does not have concerns regarding medicines.  The following changes have been made: None  Labs/ tests ordered today include:  None  No orders of the defined types were placed in this encounter.     Disposition:   FU with me in Feb.  Signed, Rollene Rotunda, MD  09/14/2021 5:12 PM    [facility]

## 2021-09-14 ENCOUNTER — Encounter: Payer: Self-pay | Admitting: Cardiology

## 2021-09-14 ENCOUNTER — Other Ambulatory Visit: Payer: Self-pay

## 2021-09-14 ENCOUNTER — Ambulatory Visit (INDEPENDENT_AMBULATORY_CARE_PROVIDER_SITE_OTHER): Payer: Commercial Managed Care - PPO | Admitting: Cardiology

## 2021-09-14 VITALS — BP 130/72 | HR 69 | Ht 66.0 in | Wt 186.0 lb

## 2021-09-14 DIAGNOSIS — I350 Nonrheumatic aortic (valve) stenosis: Secondary | ICD-10-CM | POA: Diagnosis not present

## 2021-09-14 DIAGNOSIS — I4729 Other ventricular tachycardia: Secondary | ICD-10-CM

## 2021-09-14 DIAGNOSIS — Z952 Presence of prosthetic heart valve: Secondary | ICD-10-CM

## 2021-09-14 DIAGNOSIS — I472 Ventricular tachycardia: Secondary | ICD-10-CM

## 2021-09-14 NOTE — Patient Instructions (Signed)
Medication Instructions:  Your physician recommends that you continue on your current medications as directed. Please refer to the Current Medication list given to you today.  *If you need a refill on your cardiac medications before your next appointment, please call your pharmacy*  Testing/Procedures: Echocardiogram in February 2023   Follow-Up: At [facility], you and your health needs are our priority.  As part of our continuing mission to provide you with exceptional heart care, we have created designated Provider Care Teams.  These Care Teams include your primary Cardiologist (physician) and Advanced Practice Providers (APPs -  Physician Assistants and Nurse Practitioners) who all work together to provide you with the care you need, when you need it.  We recommend signing up for the patient portal called "MyChart".  Sign up information is provided on this After Visit Summary.  MyChart is used to connect with patients for Virtual Visits (Telemedicine).  Patients are able to view lab/test results, encounter notes, upcoming appointments, etc.  Non-urgent messages can be sent to your provider as well.   To learn more about what you can do with MyChart, go to [email or web address].    Your next appointment:   Feb 2023 (after echo) with Dr. Antoine Poche

## 2021-09-19 ENCOUNTER — Ambulatory Visit: Payer: Commercial Managed Care - PPO | Admitting: Neurology

## 2021-09-22 ENCOUNTER — Other Ambulatory Visit: Payer: Self-pay | Admitting: Cardiology

## 2022-01-25 ENCOUNTER — Ambulatory Visit: Payer: Commercial Managed Care - PPO | Admitting: Neurology

## 2022-02-01 ENCOUNTER — Ambulatory Visit: Payer: Commercial Managed Care - PPO | Admitting: Neurology

## 2022-02-12 ENCOUNTER — Other Ambulatory Visit (HOSPITAL_COMMUNITY): Payer: Self-pay | Admitting: Family Medicine

## 2022-02-12 ENCOUNTER — Other Ambulatory Visit: Payer: Self-pay

## 2022-02-12 ENCOUNTER — Ambulatory Visit (HOSPITAL_COMMUNITY)
Admission: RE | Admit: 2022-02-12 | Discharge: 2022-02-12 | Disposition: A | Discharge status: Home / Self Care | Payer: Commercial Managed Care - PPO | Source: Ambulatory Visit | Attending: Family Medicine | Admitting: Family Medicine

## 2022-02-12 DIAGNOSIS — N200 Calculus of kidney: Secondary | ICD-10-CM

## 2022-02-15 ENCOUNTER — Ambulatory Visit (HOSPITAL_COMMUNITY): Payer: Commercial Managed Care - PPO | Attending: Cardiology

## 2022-02-15 ENCOUNTER — Other Ambulatory Visit: Payer: Self-pay

## 2022-02-15 DIAGNOSIS — Z952 Presence of prosthetic heart valve: Secondary | ICD-10-CM

## 2022-02-15 LAB — ECHOCARDIOGRAM COMPLETE
AR max vel: 0.82 cm2
AV Area VTI: 0.86 cm2
AV Area mean vel: 0.88 cm2
AV Mean grad: 22 mmHg
AV Peak grad: 41.7 mmHg
Ao pk vel: 3.23 m/s
Area-P 1/2: 2.66 cm2
S' Lateral: 3.3 cm

## 2022-02-22 DIAGNOSIS — R931 Abnormal findings on diagnostic imaging of heart and coronary circulation: Secondary | ICD-10-CM | POA: Insufficient documentation

## 2022-02-22 DIAGNOSIS — Z8679 Personal history of other diseases of the circulatory system: Secondary | ICD-10-CM | POA: Insufficient documentation

## 2022-02-22 NOTE — Progress Notes (Signed)
?  ?Cardiology Office Note ? ? ?Date:  02/23/2022  ? ?ID:  Cameron [name], DOB [birth year], MRN [id number] ? ?PCP:  Cameron Giovanni, MD  ?Cardiologist:   Rollene Rotunda, MD ? ? ?Chief Complaint  ?Patient presents with  ? AVR  ? ?  ?History of Present Illness: ?Cameron [name] is a 66 y.o. 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    This was functioning normally on echo last year.   However, he subsequently had a TIA.  There was some nonischemic changes on MRI.  There were no significant findings on MRA.  Follow-up echo in July demonstrated increased aortic valve pressure gradient.  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  He had minimal LAD plaque.  He has been started on Eliquis.  He has recovered from both a TIA and a subsequent stroke thought to be hypertensive urgency.  As a follow-up of his TIA I did order an event monitor which demonstrated no atrial fibrillation but did have an 11-second run of ventricular tachycardia.  He has normal coronaries and normal LV.  ? ?Last month he had a follow up echo with continued valve gradient likely secondary to thrombus but he is slightly improved.  He is having problems with kidney stones.  He is not having any cardiac issues.  He denies any chest pressure, neck or arm discomfort.  He had no new shortness of breath, PND or orthopnea.  He still walks with his wife.  He works [employer].  He has no limitations related to this. ? ? ? ?Past Medical History:  ?Diagnosis Date  ? Aortic stenosis, severe   ? Arthritis   ? Ascending aortic aneurysm   ? GERD (gastroesophageal reflux disease)   ? Headache   ? History of heart murmur in childhood   ? History of kidney stones   ? Hyperthyroidism   ? Impaired glucose tolerance   ? Nephrolithiasis   ? Sleep apnea   ? On CPAP  ? ? ?Past Surgical History:  ?Procedure Laterality Date  ? BENTALL PROCEDURE N/A 12/22/2018  ? Procedure: BENTALL PROCEDURE;   Surgeon: Alleen Borne, MD;  Location: [facility] OR;  Service: Open Heart Surgery;  Laterality: N/A;  NEEDS BILATERAL RADIAL ARTERIAL LINES  ? CARDIAC CATHETERIZATION  1990s  ? CYSTOSCOPY  1990s  ? With stone extraction  ? CYSTOSCOPY W/ URETERAL STENT PLACEMENT Left 12/02/2015  ? Procedure: CYSTOSCOPY WITH RETROGRADE PYELOGRAM/URETERAL STENT PLACEMENT;  Surgeon: Malen Gauze, MD;  Location: [facility] ORS;  Service: Urology;  Laterality: Left;  ? ESOPHAGOGASTRODUODENOSCOPY  1990s  ? KNEE ARTHROSCOPY Left 2005  ? LITHOTRIPSY  1990s  ? LITHOTRIPSY Left 11/2015  ? REPLACEMENT ASCENDING AORTA N/A 12/22/2018  ? Procedure: REPLACEMENT ASCENDING AORTA;  Surgeon: Alleen Borne, MD;  Location: [facility] OR;  Service: Open Heart Surgery;  Laterality: N/A;  ? RIGHT/LEFT HEART CATH AND CORONARY ANGIOGRAPHY N/A 12/09/2018  ? Procedure: RIGHT/LEFT HEART CATH AND CORONARY ANGIOGRAPHY;  Surgeon: Corky Crafts, MD;  Location: [facility] INVASIVE CV LAB;  Service: Cardiovascular;  Laterality: N/A;  ? TEE WITHOUT CARDIOVERSION N/A 12/22/2018  ? Procedure: TRANSESOPHAGEAL ECHOCARDIOGRAM (TEE);  Surgeon: Alleen Borne, MD;  Location: [facility] OR;  Service: Open Heart Surgery;  Laterality: N/A;  ? ULTRASOUND GUIDANCE FOR VASCULAR ACCESS  12/09/2018  ? Procedure: Ultrasound Guidance For Vascular Access;  Surgeon: Corky Crafts, MD;  Location: [facility] INVASIVE CV LAB;  Service: Cardiovascular;;  ? UMBILICAL HERNIA REPAIR  late 1990s  ? ? ? ?Current Outpatient Medications  ?Medication Sig Dispense Refill  ? amLODipine (NORVASC) 5 MG tablet Take 5 mg by mouth 2 (two) times daily.    ? apixaban (ELIQUIS) 5 MG TABS tablet Take 5 mg by mouth 2 (two) times daily.    ? cholecalciferol (VITAMIN D3) 25 MCG (1000 UNIT) tablet Take 2,000 Units by mouth daily.    ? HYDROcodone-acetaminophen (NORCO) 10-325 MG tablet Take 1 tablet by mouth 4 (four) times daily as needed.    ? metoprolol succinate (TOPROL-XL) 25 MG 24 hr tablet TAKE 1 TABLET BY MOUTH ONCE DAILY.  (Patient taking differently: Take 25 mg by mouth daily.) 90 tablet 0  ? tamsulosin (FLOMAX) 0.4 MG CAPS capsule Take 1 capsule (0.4 mg total) by mouth daily after supper. (Patient taking differently: Take 0.4 mg by mouth daily after supper. Takes 2 capsules for kidney stone) 30 capsule 0  ? traMADol (ULTRAM) 50 MG tablet Take 50 mg by mouth as needed.    ? acetaminophen (TYLENOL) 500 MG tablet Take 1,000 mg by mouth every 6 (six) hours as needed (pain). (Patient not taking: Reported on 02/23/2022)    ? promethazine (PHENERGAN) 25 MG tablet Take 25 mg by mouth every 6 (six) hours as needed for nausea or vomiting. (Patient not taking: Reported on 02/23/2022)    ? ?No current facility-administered medications for this visit.  ? ? ?Allergies:   Tramadol hcl, Demerol [meperidine], and Stadol [butorphanol]  ? ? ?ROS:  Please see the history of present illness.   Otherwise, review of systems are positive for none.   All other systems are reviewed and negative.  ? ? ?PHYSICAL EXAM: ?VS:  BP 122/68   Pulse (!) 57   Ht 5\' 6"  (1.676 m)   Wt 183 lb (83 kg)   SpO2 96%   BMI 29.54 kg/m?  , BMI Body mass index is 29.54 kg/m?. ?GENERAL:  Well appearing ?NECK:  No jugular venous distention, waveform within normal limits, carotid upstroke brisk and symmetric, no bruits, no thyromegaly ?LUNGS:  Clear to auscultation bilaterally ?CHEST:  Well healed sternotomy scar. ?HEART:  PMI not displaced or sustained,S1 and S2 within normal limits, no S3, no S4, no clicks, no rubs, 3 out of 6 apical systolic murmur radiating slightly at the aortic outflow tract, no diastolic murmurs ?ABD:  Flat, positive bowel sounds normal in frequency in pitch, no bruits, no rebound, no guarding, no midline pulsatile mass, no hepatomegaly, no splenomegaly ?EXT:  2 plus pulses throughout, no edema, no cyanosis no clubbing ? ? ?EKG:  EKG is not ordered today. ?NA ? ? ?Recent Labs: ?08/18/2021: BUN 14; Creat 1.12; Magnesium 2.1; Potassium 3.8; Sodium 138   ? ? ?Lipid Panel ?No results found for: CHOL, TRIG, HDL, CHOLHDL, VLDL, LDLCALC, LDLDIRECT ?  ? ?Wt Readings from Last 3 Encounters:  ?02/23/22 183 lb (83 kg)  ?09/14/21 186 lb (84.4 kg)  ?07/19/21 188 lb 8 oz (85.5 kg)  ?  ? ? ?Other studies Reviewed: ?Additional studies/ records that were reviewed today include: Echo ?Review of the above records demonstrates:  Please see elsewhere in the note.   ? ? ?ASSESSMENT AND PLAN: ? ?AVR:   Repeat echo demonstrated that the gradient was slightly less than previous.  He is not having any new symptoms.  He will continue anticoagulation.   ? ?AORTIC ANEURYSM REPAIR:   He had stable repair on CT last summer.  He will have this repeated in June. ?  ?HTN:  The blood pressure is at target.  No change in therapy.  ?  ?NSVT:    He has had no new symptoms.  No change in therapy. ? ?ELEVATED CORONARY CALCIUM:   He had nonobstructive disease on CT angiogram.  No change in therapy. ? ?SLEEP APNEA: He uses CPAP.  ? ?Current medicines are reviewed at length with the patient today.  The patient does not have concerns regarding medicines. ? ?The following changes have been made: None ? ?Labs/ tests ordered today include:   CT angiogram previously ordered ? ?No orders of the defined types were placed in this encounter. ? ? ?Disposition:   FU with me in 6 months ? ? ? ?Signed, ?Rollene Rotunda, MD  ?02/23/2022 2:35 PM    ?[facility] ? ? ? ?

## 2022-02-23 ENCOUNTER — Ambulatory Visit (INDEPENDENT_AMBULATORY_CARE_PROVIDER_SITE_OTHER): Payer: Commercial Managed Care - PPO | Admitting: Cardiology

## 2022-02-23 ENCOUNTER — Encounter: Payer: Self-pay | Admitting: Cardiology

## 2022-02-23 ENCOUNTER — Other Ambulatory Visit: Payer: Self-pay

## 2022-02-23 VITALS — BP 122/68 | HR 57 | Ht 66.0 in | Wt 183.0 lb

## 2022-02-23 DIAGNOSIS — G4730 Sleep apnea, unspecified: Secondary | ICD-10-CM

## 2022-02-23 DIAGNOSIS — Z952 Presence of prosthetic heart valve: Secondary | ICD-10-CM

## 2022-02-23 DIAGNOSIS — Z9889 Other specified postprocedural states: Secondary | ICD-10-CM

## 2022-02-23 DIAGNOSIS — R931 Abnormal findings on diagnostic imaging of heart and coronary circulation: Secondary | ICD-10-CM

## 2022-02-23 DIAGNOSIS — I10 Essential (primary) hypertension: Secondary | ICD-10-CM | POA: Diagnosis not present

## 2022-02-23 DIAGNOSIS — Z8679 Personal history of other diseases of the circulatory system: Secondary | ICD-10-CM

## 2022-02-23 DIAGNOSIS — I4729 Other ventricular tachycardia: Secondary | ICD-10-CM | POA: Diagnosis not present

## 2022-02-23 NOTE — Patient Instructions (Signed)
Medication Instructions:  °Your physician recommends that you continue on your current medications as directed. Please refer to the Current Medication list given to you today.  °*If you need a refill on your cardiac medications before your next appointment, please call your pharmacy* ° ° °Lab Work: °None °If you have labs (blood work) drawn today and your tests are completely normal, you will receive your results only by: °MyChart Message (if you have MyChart) OR °A paper copy in the mail °If you have any lab test that is abnormal or we need to change your treatment, we will call you to review the results. ° ° °Testing/Procedures: °None ° ° °Follow-Up: °At [facility], you and your health needs are our priority.  As part of our continuing mission to provide you with exceptional heart care, we have created designated Provider Care Teams.  These Care Teams include your primary Cardiologist (physician) and Advanced Practice Providers (APPs -  Physician Assistants and Nurse Practitioners) who all work together to provide you with the care you need, when you need it. ° °We recommend signing up for the patient portal called "MyChart".  Sign up information is provided on this After Visit Summary.  MyChart is used to connect with patients for Virtual Visits (Telemedicine).  Patients are able to view lab/test results, encounter notes, upcoming appointments, etc.  Non-urgent messages can be sent to your provider as well.   °To learn more about what you can do with MyChart, go to [email or web address].   ° °Your next appointment:   °6 month(s) ° °The format for your next appointment:   °In Person ° °Provider:   °James Hochrein, MD   ° ° °Other Instructions °  °

## 2022-03-05 ENCOUNTER — Ambulatory Visit (INDEPENDENT_AMBULATORY_CARE_PROVIDER_SITE_OTHER): Payer: Commercial Managed Care - PPO | Admitting: Urology

## 2022-03-05 ENCOUNTER — Ambulatory Visit (HOSPITAL_COMMUNITY)
Admission: RE | Admit: 2022-03-05 | Discharge: 2022-03-05 | Disposition: A | Discharge status: Home / Self Care | Payer: Commercial Managed Care - PPO | Source: Ambulatory Visit | Attending: Urology | Admitting: Urology

## 2022-03-05 ENCOUNTER — Encounter: Payer: Self-pay | Admitting: Urology

## 2022-03-05 ENCOUNTER — Other Ambulatory Visit: Payer: Self-pay

## 2022-03-05 VITALS — BP 140/83 | HR 57

## 2022-03-05 DIAGNOSIS — R1011 Right upper quadrant pain: Secondary | ICD-10-CM | POA: Insufficient documentation

## 2022-03-05 DIAGNOSIS — N200 Calculus of kidney: Secondary | ICD-10-CM

## 2022-03-05 LAB — URINALYSIS, ROUTINE W REFLEX MICROSCOPIC
Bilirubin, UA: NEGATIVE
Glucose, UA: NEGATIVE
Ketones, UA: NEGATIVE
Leukocytes,UA: NEGATIVE
Nitrite, UA: NEGATIVE
Protein,UA: NEGATIVE
RBC, UA: NEGATIVE
Specific Gravity, UA: 1.025 (ref 1.005–1.030)
Urobilinogen, Ur: 0.2 mg/dL (ref 0.2–1.0)
pH, UA: 5.5 (ref 5.0–7.5)

## 2022-03-05 MED ORDER — CIPROFLOXACIN HCL 500 MG PO TABS: 500.0000 mg | ORAL_TABLET | Freq: Two times a day (BID) | ORAL | 0 refills | Status: DC

## 2022-03-05 MED ORDER — METRONIDAZOLE 500 MG PO TABS: 500.0000 mg | ORAL_TABLET | Freq: Two times a day (BID) | ORAL | 0 refills | Status: DC

## 2022-03-05 NOTE — Patient Instructions (Signed)
Diverticulitis Diverticulitis is infection or inflammation of small pouches (diverticula) in the colon that form due to a condition called diverticulosis. Diverticula can trap stool (feces) and bacteria, causing infection and inflammation. Diverticulitis may cause severe stomach pain and diarrhea. It may lead to tissue damage in the colon that causes bleeding or blockage. The diverticula may also burst (rupture) and cause infected stool to enter other areas of the abdomen. What are the causes? This condition is caused by stool becoming trapped in the diverticula, which allows bacteria to grow in the diverticula. This leads to inflammation and infection. What increases the risk? You are more likely to develop this condition if you have diverticulosis. The risk increases if you: Are overweight or obese. Do not get enough exercise. Drink alcohol. Use tobacco products. Eat a diet that has a lot of red meat such as beef, pork, or lamb. Eat a diet that does not include enough fiber. High-fiber foods include fruits, vegetables, beans, nuts, and whole grains. Are over 40 years of age. What are the signs or symptoms? Symptoms of this condition may include: Pain and tenderness in the abdomen. The pain is normally located on the left side of the abdomen, but it may occur in other areas. Fever and chills. Nausea. Vomiting. Cramping. Bloating. Changes in bowel routines. Blood in your stool. How is this diagnosed? This condition is diagnosed based on: Your medical history. A physical exam. Tests to make sure there is nothing else causing your condition. These tests may include: Blood tests. Urine tests. CT scan of the abdomen. How is this treated? Most cases of this condition are mild and can be treated at home. Treatment may include: Taking over-the-counter pain medicines. Following a clear liquid diet. Taking antibiotic medicines by mouth. Resting. More severe cases may need to be treated  at a hospital. Treatment may include: Not eating or drinking. Taking prescription pain medicine. Receiving antibiotic medicines through an IV. Receiving fluids and nutrition through an IV. Surgery. When your condition is under control, your health care provider may recommend that you have a colonoscopy. This is an exam to look at the entire large intestine. During the exam, a lubricated, bendable tube is inserted into the anus and then passed into the rectum, colon, and other parts of the large intestine. A colonoscopy can show how severe your diverticula are and whether something else may be causing your symptoms. Follow these instructions at home: Medicines Take over-the-counter and prescription medicines only as told by your health care provider. These include fiber supplements, probiotics, and stool softeners. If you were prescribed an antibiotic medicine, take it as told by your health care provider. Do not stop taking the antibiotic even if you start to feel better. Ask your health care provider if the medicine prescribed to you requires you to avoid driving or using machinery. Eating and drinking  Follow a full liquid diet or another diet as directed by your health care provider. After your symptoms improve, your health care provider may tell you to change your diet. He or she may recommend that you eat a diet that contains at least 25 grams (25 g) of fiber daily. Fiber makes it easier to pass stool. Healthy sources of fiber include: Berries. One cup contains 4-8 grams of fiber. Beans or lentils. One-half cup contains 5-8 grams of fiber. Green vegetables. One cup contains 4 grams of fiber. Avoid eating red meat. General instructions Do not use any products that contain nicotine or tobacco, such as cigarettes,   e-cigarettes, and chewing tobacco. If you need help quitting, ask your health care provider. Exercise for at least 30 minutes, 3 times each week. You should exercise hard enough to  raise your heart rate and break a sweat. Keep all follow-up visits as told by your health care provider. This is important. You may need to have a colonoscopy. Contact a health care provider if: Your pain does not improve. Your bowel movements do not return to normal. Get help right away if: Your pain gets worse. Your symptoms do not get better with treatment. Your symptoms suddenly get worse. You have a fever. You vomit more than one time. You have stools that are bloody, black, or tarry. Summary Diverticulitis is infection or inflammation of small pouches (diverticula) in the colon that form due to a condition called diverticulosis. Diverticula can trap stool (feces) and bacteria, causing infection and inflammation. You are at higher risk for this condition if you have diverticulosis and you eat a diet that does not include enough fiber. Most cases of this condition are mild and can be treated at home. More severe cases may need to be treated at a hospital. When your condition is under control, your health care provider may recommend that you have an exam called a colonoscopy. This exam can show how severe your diverticula are and whether something else may be causing your symptoms. Keep all follow-up visits as told by your health care provider. This is important. This information is not intended to replace advice given to you by your health care provider. Make sure you discuss any questions you have with your health care provider. Document Revised: 09/21/2019 Document Reviewed: 09/21/2019 Elsevier Patient Education  2022 [employer]  

## 2022-03-05 NOTE — Progress Notes (Signed)
03/05/2022 11:18 AM   Delila Spence 01/15/1956 [id number]  Referring provider: [name], MD [address] [place],  [place] [address]  nephrolithiasis   HPI: Mr Morss is a 65yo here for evaluation of nephrolithiasis.  6 weeks ago he developed right flank pain. The pain is sharp, intermittent, moderate and nonraditing. He has had intermittent gross hematuria for the past 4 weeks. No dysuria or any other significant LUTS. No alleviating events. He is taking norco which does not control the pain.  He is on eliquis BID   PMH: Past Medical History:  Diagnosis Date   Aortic stenosis, severe    Arthritis    Ascending aortic aneurysm    GERD (gastroesophageal reflux disease)    Headache    History of heart murmur in childhood    History of kidney stones    Hyperthyroidism    Impaired glucose tolerance    Nephrolithiasis    Sleep apnea    On CPAP    Surgical History: Past Surgical History:  Procedure Laterality Date   BENTALL PROCEDURE N/A 12/22/2018   Procedure: BENTALL PROCEDURE;  Surgeon: Alleen Borne, MD;  Location: [facility];  Service: Open Heart Surgery;  Laterality: N/A;  NEEDS BILATERAL RADIAL ARTERIAL LINES   CARDIAC CATHETERIZATION  1990s   CYSTOSCOPY  1990s   With stone extraction   CYSTOSCOPY W/ URETERAL STENT PLACEMENT Left 12/02/2015   Procedure: CYSTOSCOPY WITH RETROGRADE PYELOGRAM/URETERAL STENT PLACEMENT;  Surgeon: Malen Gauze, MD;  Location: [facility];  Service: Urology;  Laterality: Left;   ESOPHAGOGASTRODUODENOSCOPY  1990s   KNEE ARTHROSCOPY Left 2005   LITHOTRIPSY  1990s   LITHOTRIPSY Left 11/2015   REPLACEMENT ASCENDING AORTA N/A 12/22/2018   Procedure: REPLACEMENT ASCENDING AORTA;  Surgeon: Alleen Borne, MD;  Location: [facility] OR;  Service: Open Heart Surgery;  Laterality: N/A;   RIGHT/LEFT HEART CATH AND CORONARY ANGIOGRAPHY N/A 12/09/2018   Procedure: RIGHT/LEFT HEART CATH AND CORONARY ANGIOGRAPHY;  Surgeon: Corky Crafts, MD;   Location: [facility] INVASIVE CV LAB;  Service: Cardiovascular;  Laterality: N/A;   TEE WITHOUT CARDIOVERSION N/A 12/22/2018   Procedure: TRANSESOPHAGEAL ECHOCARDIOGRAM (TEE);  Surgeon: Alleen Borne, MD;  [facility];  Service: Open Heart Surgery;  Laterality: N/A;   ULTRASOUND GUIDANCE FOR VASCULAR ACCESS  12/09/2018   Procedure: Ultrasound Guidance For Vascular Access;  Surgeon: Corky Crafts, MD;  Location: [facility] INVASIVE CV LAB;  Service: Cardiovascular;;   UMBILICAL HERNIA REPAIR  late 1990s    Home Medications:  Allergies as of 03/05/2022       Reactions   Tramadol Hcl Itching   Patient tolerates specific NDC generic product - family is able to bring supply in from home.    Demerol [meperidine] Anxiety, Other (See Comments)   Makes pt hyper, the higher the dose the more hyper pt gets   Stadol [butorphanol] Anxiety, Other (See Comments)   Hyper- the higher the dose the more hyper pt gets        Medication List        Accurate as of March 05, 2022 11:18 AM. If you have any questions, ask your nurse or doctor.          acetaminophen 500 MG tablet Commonly known as: TYLENOL Take 1,000 mg by mouth every 6 (six) hours as needed (pain).   amLODipine 5 MG tablet Commonly known as: NORVASC Take 5 mg by mouth 2 (two) times daily.   apixaban 5 MG Tabs tablet Commonly  known as: ELIQUIS Take 5 mg by mouth 2 (two) times daily.   cholecalciferol 25 MCG (1000 UNIT) tablet Commonly known as: VITAMIN D3 Take 2,000 Units by mouth daily.   HYDROcodone-acetaminophen 10-325 MG tablet Commonly known as: NORCO Take 1 tablet by mouth 4 (four) times daily as needed.   metoprolol succinate 25 MG 24 hr tablet Commonly known as: TOPROL-XL TAKE 1 TABLET BY MOUTH ONCE DAILY.   promethazine 25 MG tablet Commonly known as: PHENERGAN Take 25 mg by mouth every 6 (six) hours as needed for nausea or vomiting.   tamsulosin 0.4 MG Caps capsule Commonly known as: Flomax Take 1 capsule  (0.4 mg total) by mouth daily after supper. What changed: additional instructions   traMADol 50 MG tablet Commonly known as: ULTRAM Take 50 mg by mouth as needed.        Allergies:  Allergies  Allergen Reactions   Tramadol Hcl Itching    Patient tolerates specific NDC generic product - family is able to bring supply in from home.    Demerol [Meperidine] Anxiety and Other (See Comments)    Makes pt hyper, the higher the dose the more hyper pt gets   Stadol [Butorphanol] Anxiety and Other (See Comments)    Hyper- the higher the dose the more hyper pt gets    Family History: Family History  Problem Relation Age of Onset   Depression Mother    Hyperlipidemia Mother    Cancer Father    Kidney disease Father    Breast cancer Maternal Grandmother    Heart disease Maternal Grandfather     Social History:  reports that he has never smoked. He has been exposed to tobacco smoke. He has never used smokeless tobacco. He reports current alcohol use. He reports that he does not use drugs.  ROS: All other review of systems were reviewed and are negative except what is noted above in HPI  Physical Exam: BP 140/83    Pulse (!) 57   Constitutional:  Alert and oriented, No acute distress. HEENT: [place] AT, moist mucus membranes.  Trachea midline, no masses. Cardiovascular: No clubbing, cyanosis, or edema. Respiratory: Normal respiratory effort, no increased work of breathing. GI: Abdomen is soft, nontender, nondistended, no abdominal masses GU: No CVA tenderness.  Lymph: No cervical or inguinal lymphadenopathy. Skin: No rashes, bruises or suspicious lesions. Neurologic: Grossly intact, no focal deficits, moving all 4 extremities. Psychiatric: Normal mood and affect.  Laboratory Data: Lab Results  Component Value Date   WBC 8.9 12/27/2018   HGB 9.1 (L) 12/27/2018   HCT 28.6 (L) 12/27/2018   MCV 93.2 12/27/2018   PLT 190 12/27/2018    Lab Results  Component Value Date    CREATININE 1.12 08/18/2021    No results found for: PSA  No results found for: TESTOSTERONE  Lab Results  Component Value Date   HGBA1C 5.4 12/19/2018    Urinalysis    Component Value Date/Time   COLORURINE YELLOW 12/19/2018 1202   APPEARANCEUR CLEAR 12/19/2018 1202   LABSPEC 1.017 12/19/2018 1202   PHURINE 6.0 12/19/2018 1202   GLUCOSEU NEGATIVE 12/19/2018 1202   HGBUR NEGATIVE 12/19/2018 1202   BILIRUBINUR NEGATIVE 12/19/2018 1202   KETONESUR NEGATIVE 12/19/2018 1202   PROTEINUR NEGATIVE 12/19/2018 1202   NITRITE NEGATIVE 12/19/2018 1202   LEUKOCYTESUR NEGATIVE 12/19/2018 1202    No results found for: LABMICR, WBCUA, RBCUA, LABEPIT, MUCUS, BACTERIA  Pertinent Imaging: Renal US 02/12/2022: Images reviewed and discussed with the patient Results for  orders placed during the hospital encounter of 08/07/16  DG Abd 1 View  Narrative CLINICAL DATA:  Kidney stones.  EXAM: ABDOMEN - 1 VIEW  COMPARISON:  07/03/2016.  06/18/2016.  03/15/2016 .  FINDINGS: Soft tissue structures are unremarkable. Small left renal stones noted. Pelvic calcifications are again noted and unchanged in appearance. These are most likely phleboliths. Again distal ureteral calculus, particular the left ureteric cannot be excluded.  IMPRESSION: 1. Stable left nephrolithiasis.  2. Stable pelvic calcifications, most likely phleboliths. Distal ureteral stones, particular in the distal left ureter again cannot be excluded.   Electronically Signed By: Maisie Fushomas  Register On: 08/08/2016 08:23  No results found for this or any previous visit.  No results found for this or any previous visit.  No results found for this or any previous visit.  Results for orders placed during the hospital encounter of 02/12/22  US RENAL  Narrative CLINICAL DATA:  Kidney stone. Patient reports right flank pain for 3 days.  EXAM: RENAL / URINARY TRACT ULTRASOUND COMPLETE  COMPARISON:  CT  05/29/2017  FINDINGS: Right Kidney:  Renal measurements: 10.5 x 5.6 x 5.6 cm = volume: 170 mL. Normal parenchymal echogenicity. No hydronephrosis. No visualized stone or focal lesion.  Left Kidney:  Renal measurements: 11.9 x 6 x 4.9 cm = volume: 183 mL. Normal parenchymal echogenicity. No hydronephrosis. Stone on prior CT is not well-defined by ultrasound. No focal lesion.  Bladder:  Appears normal for degree of bladder distention. Neither ureteral jet is seen.  Other:  Incidental note of increased hepatic parenchymal echogenicity typical of steatosis.  IMPRESSION: 1. No hydronephrosis.  No visualized renal calculi. 2. Punctate left nonobstructing stone on prior CT is not seen.   Electronically Signed By: Narda RutherfordMelanie  Sanford M.D. On: 02/12/2022 17:28  No results found for this or any previous visit.  No results found for this or any previous visit.  No results found for this or any previous visit.   Assessment & Plan:    1. Kidney stones CT shows small bilateral renal  - Urinalysis, Routine w reflex microscopic  2. Diverticulitis -Cipro 500mg  BID for 7 days -Flagyl 500mg  BID for 7 days   No follow-ups on file.  Wilkie AyePatrick Nataliyah Packham, MD  [facility] [place]

## 2022-03-19 ENCOUNTER — Ambulatory Visit: Payer: Commercial Managed Care - PPO | Admitting: Urology

## 2022-06-07 ENCOUNTER — Other Ambulatory Visit (HOSPITAL_COMMUNITY): Payer: Commercial Managed Care - PPO

## 2022-06-18 ENCOUNTER — Ambulatory Visit (INDEPENDENT_AMBULATORY_CARE_PROVIDER_SITE_OTHER)
Admission: RE | Admit: 2022-06-18 | Discharge: 2022-06-18 | Disposition: A | Discharge status: Home / Self Care | Payer: Commercial Managed Care - PPO | Source: Ambulatory Visit | Attending: Cardiology | Admitting: Cardiology

## 2022-06-18 DIAGNOSIS — T8209XA Other mechanical complication of heart valve prosthesis, initial encounter: Secondary | ICD-10-CM | POA: Diagnosis not present

## 2022-06-18 MED ORDER — IOHEXOL 350 MG/ML SOLN
100.0000 mL | Freq: Once | INTRAVENOUS | Status: AC | PRN
Start: 2022-06-18 — End: 2022-06-18
  Administered 2022-06-18: 100 mL via INTRAVENOUS

## 2022-06-29 ENCOUNTER — Encounter: Payer: Self-pay | Admitting: *Deleted

## 2022-09-03 ENCOUNTER — Ambulatory Visit (HOSPITAL_COMMUNITY)
Admission: RE | Admit: 2022-09-03 | Discharge: 2022-09-03 | Disposition: A | Discharge status: Home / Self Care | Payer: Commercial Managed Care - PPO | Source: Ambulatory Visit | Attending: Urology | Admitting: Urology

## 2022-09-03 DIAGNOSIS — N200 Calculus of kidney: Secondary | ICD-10-CM | POA: Insufficient documentation

## 2022-09-07 ENCOUNTER — Ambulatory Visit (INDEPENDENT_AMBULATORY_CARE_PROVIDER_SITE_OTHER): Payer: Commercial Managed Care - PPO | Admitting: Urology

## 2022-09-07 ENCOUNTER — Encounter: Payer: Self-pay | Admitting: Urology

## 2022-09-07 VITALS — BP 131/74 | HR 60

## 2022-09-07 DIAGNOSIS — R351 Nocturia: Secondary | ICD-10-CM | POA: Diagnosis not present

## 2022-09-07 DIAGNOSIS — N401 Enlarged prostate with lower urinary tract symptoms: Secondary | ICD-10-CM

## 2022-09-07 DIAGNOSIS — Z87442 Personal history of urinary calculi: Secondary | ICD-10-CM

## 2022-09-07 DIAGNOSIS — N200 Calculus of kidney: Secondary | ICD-10-CM

## 2022-09-07 DIAGNOSIS — N138 Other obstructive and reflux uropathy: Secondary | ICD-10-CM

## 2022-09-07 LAB — MICROSCOPIC EXAMINATION
RBC, Urine: NONE SEEN /hpf (ref 0–2)
Renal Epithel, UA: NONE SEEN /hpf

## 2022-09-07 LAB — URINALYSIS, ROUTINE W REFLEX MICROSCOPIC
Bilirubin, UA: NEGATIVE
Glucose, UA: NEGATIVE
Ketones, UA: NEGATIVE
Nitrite, UA: NEGATIVE
Protein,UA: NEGATIVE
RBC, UA: NEGATIVE
Specific Gravity, UA: 1.015 (ref 1.005–1.030)
Urobilinogen, Ur: 0.2 mg/dL (ref 0.2–1.0)
pH, UA: 5.5 (ref 5.0–7.5)

## 2022-09-07 MED ORDER — TAMSULOSIN HCL 0.4 MG PO CAPS: 0.4000 mg | ORAL_CAPSULE | Freq: Every day | ORAL | 11 refills | Status: DC

## 2022-09-07 NOTE — Patient Instructions (Signed)

## 2022-09-07 NOTE — Progress Notes (Signed)
09/07/2022 10:31 AM   Delila Spence June 13, 1956 [id number]  Referring provider: [name], MD [address] [place],  [place] [address]  Followup nephrolithiasis   HPI: Mr Marbach is a 66yo here for followup for nephrolithiasis. No stone events since last visit. Renal US 9/12 shows no calculi.  IPSS 5 QOL 2 on flomax 0.4mg  daily. Urine stream strong. He has intermittent urinary urgency. He has chronic right flank pain/lower back pain and takes norco prn. No other compalitns today   PMH: Past Medical History:  Diagnosis Date   Aortic stenosis, severe    Arthritis    Ascending aortic aneurysm (HCC)    GERD (gastroesophageal reflux disease)    Headache    History of heart murmur in childhood    History of kidney stones    Hyperthyroidism    Impaired glucose tolerance    Nephrolithiasis    Sleep apnea    On CPAP    Surgical History: Past Surgical History:  Procedure Laterality Date   BENTALL PROCEDURE N/A 12/22/2018   Procedure: BENTALL PROCEDURE;  Surgeon: Alleen Borne, MD;  Location: [facility] OR;  Service: Open Heart Surgery;  Laterality: N/A;  NEEDS BILATERAL RADIAL ARTERIAL LINES   CARDIAC CATHETERIZATION  1990s   CYSTOSCOPY  1990s   With stone extraction   CYSTOSCOPY W/ URETERAL STENT PLACEMENT Left 12/02/2015   Procedure: CYSTOSCOPY WITH RETROGRADE PYELOGRAM/URETERAL STENT PLACEMENT;  Surgeon: Malen Gauze, MD;  Location: [facility];  Service: Urology;  Laterality: Left;   ESOPHAGOGASTRODUODENOSCOPY  1990s   KNEE ARTHROSCOPY Left 2005   LITHOTRIPSY  1990s   LITHOTRIPSY Left 11/2015   REPLACEMENT ASCENDING AORTA N/A 12/22/2018   Procedure: REPLACEMENT ASCENDING AORTA;  Surgeon: Alleen Borne, MD;  Location: [facility] OR;  Service: Open Heart Surgery;  Laterality: N/A;   RIGHT/LEFT HEART CATH AND CORONARY ANGIOGRAPHY N/A 12/09/2018   Procedure: RIGHT/LEFT HEART CATH AND CORONARY ANGIOGRAPHY;  Surgeon: Corky Crafts, MD;  [facility]VASIVE CV LAB;   Service: Cardiovascular;  Laterality: N/A;   TEE WITHOUT CARDIOVERSION N/A 12/22/2018   Procedure: TRANSESOPHAGEAL ECHOCARDIOGRAM (TEE);  Surgeon: Alleen Borne, MD;  Location: [facility] OR;  Service: Open Heart Surgery;  Laterality: N/A;   ULTRASOUND GUIDANCE FOR VASCULAR ACCESS  12/09/2018   Procedure: Ultrasound Guidance For Vascular Access;  Surgeon: Corky Crafts, MD;  Location: [facility] INVASIVE CV LAB;  Service: Cardiovascular;;   UMBILICAL HERNIA REPAIR  late 1990s    Home Medications:  Allergies as of 09/07/2022       Reactions   Tramadol Hcl Itching   Patient tolerates specific NDC generic product - family is able to bring supply in from home.    Demerol [meperidine] Anxiety, Other (See Comments)   Makes pt hyper, the higher the dose the more hyper pt gets   Stadol [butorphanol] Anxiety, Other (See Comments)   Hyper- the higher the dose the more hyper pt gets        Medication List        Accurate as of September 07, 2022 10:31 AM. If you have any questions, ask your nurse or doctor.          acetaminophen 500 MG tablet Commonly known as: TYLENOL Take 1,000 mg by mouth every 6 (six) hours as needed (pain).   amLODipine 5 MG tablet Commonly known as: NORVASC Take 5 mg by mouth 2 (two) times daily.   apixaban 5 MG Tabs tablet Commonly known as: ELIQUIS Take 5 mg by  mouth 2 (two) times daily.   cholecalciferol 25 MCG (1000 UNIT) tablet Commonly known as: VITAMIN D3 Take 2,000 Units by mouth daily.   ciprofloxacin 500 MG tablet Commonly known as: CIPRO Take 1 tablet (500 mg total) by mouth every 12 (twelve) hours.   HYDROcodone-acetaminophen 10-325 MG tablet Commonly known as: NORCO Take 1 tablet by mouth 4 (four) times daily as needed.   metoprolol succinate 25 MG 24 hr tablet Commonly known as: TOPROL-XL TAKE 1 TABLET BY MOUTH ONCE DAILY.   metroNIDAZOLE 500 MG tablet Commonly known as: Flagyl Take 1 tablet (500 mg total) by mouth 2 (two) times  daily.   promethazine 25 MG tablet Commonly known as: PHENERGAN Take 25 mg by mouth every 6 (six) hours as needed for nausea or vomiting.   tamsulosin 0.4 MG Caps capsule Commonly known as: Flomax Take 1 capsule (0.4 mg total) by mouth daily after supper. What changed: additional instructions   traMADol 50 MG tablet Commonly known as: ULTRAM Take 50 mg by mouth as needed.        Allergies:  Allergies  Allergen Reactions   Tramadol Hcl Itching    Patient tolerates specific NDC generic product - family is able to bring supply in from home.    Demerol [Meperidine] Anxiety and Other (See Comments)    Makes pt hyper, the higher the dose the more hyper pt gets   Stadol [Butorphanol] Anxiety and Other (See Comments)    Hyper- the higher the dose the more hyper pt gets    Family History: Family History  Problem Relation Age of Onset   Depression Mother    Hyperlipidemia Mother    Cancer Father    Kidney disease Father    Breast cancer Maternal Grandmother    Heart disease Maternal Grandfather     Social History:  reports that he has never smoked. He has been exposed to tobacco smoke. He has never used smokeless tobacco. He reports current alcohol use. He reports that he does not use drugs.  ROS: All other review of systems were reviewed and are negative except what is noted above in HPI  Physical Exam: BP 131/74   Pulse 60   Constitutional:  Alert and oriented, No acute distress. HEENT: [place] AT, moist mucus membranes.  Trachea midline, no masses. Cardiovascular: No clubbing, cyanosis, or edema. Respiratory: Normal respiratory effort, no increased work of breathing. GI: Abdomen is soft, nontender, nondistended, no abdominal masses GU: No CVA tenderness.  Lymph: No cervical or inguinal lymphadenopathy. Skin: No rashes, bruises or suspicious lesions. Neurologic: Grossly intact, no focal deficits, moving all 4 extremities. Psychiatric: Normal mood and affect.  Laboratory  Data: Lab Results  Component Value Date   WBC 8.9 12/27/2018   HGB 9.1 (L) 12/27/2018   HCT 28.6 (L) 12/27/2018   MCV 93.2 12/27/2018   PLT 190 12/27/2018    Lab Results  Component Value Date   CREATININE 1.12 08/18/2021    No results found for: "PSA"  No results found for: "TESTOSTERONE"  Lab Results  Component Value Date   HGBA1C 5.4 12/19/2018    Urinalysis    Component Value Date/Time   COLORURINE YELLOW 12/19/2018 1202   APPEARANCEUR Clear 03/05/2022 1113   LABSPEC 1.017 12/19/2018 1202   PHURINE 6.0 12/19/2018 1202   GLUCOSEU Negative 03/05/2022 1113   HGBUR NEGATIVE 12/19/2018 1202   BILIRUBINUR Negative 03/05/2022 1113   KETONESUR NEGATIVE 12/19/2018 1202   PROTEINUR Negative 03/05/2022 1113   PROTEINUR NEGATIVE 12/19/2018  1202   NITRITE Negative 03/05/2022 1113   NITRITE NEGATIVE 12/19/2018 1202   LEUKOCYTESUR Negative 03/05/2022 1113    Lab Results  Component Value Date   LABMICR Comment 03/05/2022    Pertinent Imaging: Renal US 09/04/2022: images reviewed and discussed with the patient  Results for orders placed during the hospital encounter of 08/07/16  DG Abd 1 View  Narrative CLINICAL DATA:  Kidney stones.  EXAM: ABDOMEN - 1 VIEW  COMPARISON:  07/03/2016.  06/18/2016.  03/15/2016 .  FINDINGS: Soft tissue structures are unremarkable. Small left renal stones noted. Pelvic calcifications are again noted and unchanged in appearance. These are most likely phleboliths. Again distal ureteral calculus, particular the left ureteric cannot be excluded.  IMPRESSION: 1. Stable left nephrolithiasis.  2. Stable pelvic calcifications, most likely phleboliths. Distal ureteral stones, particular in the distal left ureter again cannot be excluded.   Electronically Signed By: Maisie Fus  Register On: 08/08/2016 08:23  No results found for this or any previous visit.  No results found for this or any previous visit.  No results found for this or  any previous visit.  Results for orders placed during the hospital encounter of 09/03/22  Ultrasound renal complete  Narrative CLINICAL DATA:  Kidney stones.  EXAM: RENAL / URINARY TRACT ULTRASOUND COMPLETE  COMPARISON:  CT abdomen pelvis dated 03/05/2022 and renal ultrasound dated 02/12/2022.  FINDINGS: Right Kidney:  Renal measurements: 11.3 x 6.1 x 6.1 cm = volume: 217 mL. Normal echogenicity. No hydronephrosis or shadowing stone.  Left Kidney:  Renal measurements: 13.3 x 6.7 x 6.6 cm = volume: 304 mL. Normal echogenicity. No hydronephrosis or shadowing stone. There is a 12 mm interpolar cyst.  Bladder:  Appears normal for degree of bladder distention. Bilateral ureteral jets noted.  Other:  There is diffuse increased liver echogenicity most commonly seen in the setting of fatty infiltration. Superimposed inflammation or fibrosis is not excluded. Clinical correlation is recommended.  IMPRESSION: 1. No hydronephrosis or shadowing stone. 2. Small left renal interpolar cyst. 3. Fatty liver.   Electronically Signed By: Elgie Collard M.D. On: 09/04/2022 20:32  No results found for this or any previous visit.  No results found for this or any previous visit.  Results for orders placed during the hospital encounter of 03/05/22  CT RENAL STONE STUDY  Narrative CLINICAL DATA:  Right flank pain for 5 weeks. History of nephrolithiasis.  EXAM: CT ABDOMEN AND PELVIS WITHOUT CONTRAST  TECHNIQUE: Multidetector CT imaging of the abdomen and pelvis was performed following the standard protocol without IV contrast.  RADIATION DOSE REDUCTION: This exam was performed according to the departmental dose-optimization program which includes automated exposure control, adjustment of the mA and/or kV according to patient size and/or use of iterative reconstruction technique.  COMPARISON:  12/01/2015 CT abdomen/pelvis. 05/29/2017 CT angiogram of the chest, abdomen  and pelvis.  FINDINGS: Lower chest: No significant pulmonary nodules or acute consolidative airspace disease. Intact visualized lower sternotomy wire. Small amount of fluid in the lower thoracic esophagus.  Hepatobiliary: Mild diffuse hepatic steatosis. Normal liver size. No definite liver surface irregularity. No liver masses. Normal gallbladder with no radiopaque cholelithiasis. No biliary ductal dilatation.  Pancreas: Normal, with no mass or duct dilation.  Spleen: Normal size. No mass.  Adrenals/Urinary Tract: Normal adrenals. Nonobstructing 2 mm lower right renal stone. Nonobstructing 2 mm interpolar and 1 mm lower left renal stones. No hydronephrosis. Normal caliber ureters. No ureteral stones. No contour deforming renal masses. Normal bladder.  Stomach/Bowel: Probable small  hiatal hernia. Otherwise normal nondistended stomach. Normal caliber small bowel with no small bowel wall thickening. Normal appendix. Marked sigmoid diverticulosis. Mild wall thickening with associated mild pericolonic fat stranding in the mid sigmoid colon (series 2/image 60), compatible with mild acute sigmoid diverticulitis. No pericolonic free air or abscess.  Vascular/Lymphatic: Atherosclerotic nonaneurysmal abdominal aorta. No pathologically enlarged lymph nodes in the abdomen or pelvis.  Reproductive: Moderate prostatomegaly. Nonspecific heterogeneous internal prostatic calcifications.  Other: No pneumoperitoneum, ascites or focal fluid collection. Small fat containing right inguinal hernia.  Musculoskeletal: No aggressive appearing focal osseous lesions. Mild thoracolumbar spondylosis.  IMPRESSION: 1. Mild acute sigmoid diverticulitis. No free air or abscess. 2. Tiny nonobstructing bilateral nephrolithiasis. No hydronephrosis. No ureteral or bladder stones. 3. Moderate prostatomegaly. 4. Mild diffuse hepatic steatosis. 5. Probable small hiatal hernia. Small amount of fluid in the  lower thoracic esophagus, suggesting esophageal dysmotility and/or gastroesophageal reflux. 6. Small fat containing right inguinal hernia. 7. Aortic Atherosclerosis (ICD10-I70.0).  These results will be called to the ordering clinician or representative by the Radiology Department at the imaging location.   Electronically Signed By: Delbert Phenix M.D. On: 03/05/2022 12:44   Assessment & Plan:    1. Kidney stones -RTC 1 year with renal US - Urinalysis, Routine w reflex microscopic  2. BPh with LUTS, nocturia -Continue flomax 0.4mg  daily   No follow-ups on file.  Wilkie Aye, MD  [facility] [place]

## 2022-10-20 NOTE — Progress Notes (Unsigned)
Cardiology Office Note   Date:  10/22/2022   ID:  Cameron [name], DOB 1956-05-07, MRN [id number]  PCP:  [name], MD  Cardiologist:   Rollene Rotunda, MD   Chief Complaint  Patient presents with   AVR     History of Present Illness: Cameron [name] is a 66 y.o. 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    This was functioning normally on echo last year.   However, he subsequently had a TIA.  There was some nonischemic changes on MRI.  There were no significant findings on MRA.  Follow-up echo in July demonstrated increased aortic valve pressure gradient.  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  He had minimal LAD plaque.  He has been started on Eliquis.  He has recovered from both a TIA and a subsequent stroke thought to be hypertensive urgency.  As a follow-up of his TIA I did order an event monitor which demonstrated no atrial fibrillation but did have an 11-second run of ventricular tachycardia.  He has normal coronaries and normal LV. He had a follow up echo with continued valve gradient likely secondary to thrombus but he is slightly improved.    He returns for follow up.  Since I last saw him he has done well.  He walks a lot at work.  He walks for half hour with his wife walking the dog [place]. The patient denies any new symptoms such as chest discomfort, neck or arm discomfort. There has been no new shortness of breath, PND or orthopnea. There have been no reported palpitations, presyncope or syncope.   Past Medical History:  Diagnosis Date   Aortic stenosis, severe    Arthritis    Ascending aortic aneurysm (HCC)    GERD (gastroesophageal reflux disease)    Headache    History of heart murmur in childhood    History of kidney stones    Hyperthyroidism    Impaired glucose tolerance    Nephrolithiasis    Sleep apnea    On CPAP    Past Surgical History:  Procedure Laterality  Date   BENTALL PROCEDURE N/A 12/22/2018   Procedure: BENTALL PROCEDURE;  Surgeon: Alleen Borne, MD;  Location: [facility] OR;  Service: Open Heart Surgery;  Laterality: N/A;  NEEDS BILATERAL RADIAL ARTERIAL LINES   CARDIAC CATHETERIZATION  1990s   CYSTOSCOPY  1990s   With stone extraction   CYSTOSCOPY W/ URETERAL STENT PLACEMENT Left 12/02/2015   Procedure: CYSTOSCOPY WITH RETROGRADE PYELOGRAM/URETERAL STENT PLACEMENT;  Surgeon: Malen Gauze, MD;  Location: [facility] ORS;  Service: Urology;  Laterality: Left;   ESOPHAGOGASTRODUODENOSCOPY  1990s   KNEE ARTHROSCOPY Left 2005   LITHOTRIPSY  1990s   LITHOTRIPSY Left 11/2015   REPLACEMENT ASCENDING AORTA N/A 12/22/2018   Procedure: REPLACEMENT ASCENDING AORTA;  Surgeon: Alleen Borne, MD;  Location: [facility] OR;  Service: Open Heart Surgery;  Laterality: N/A;   RIGHT/LEFT HEART CATH AND CORONARY ANGIOGRAPHY N/A 12/09/2018   Procedure: RIGHT/LEFT HEART CATH AND CORONARY ANGIOGRAPHY;  Surgeon: Corky Crafts, MD;  Location: [facility] INVASIVE CV LAB;  Service: Cardiovascular;  Laterality: N/A;   TEE WITHOUT CARDIOVERSION N/A 12/22/2018   Procedure: TRANSESOPHAGEAL ECHOCARDIOGRAM (TEE);  Surgeon: Alleen Borne, MD;  Location: [facility] OR;  Service: Open Heart Surgery;  Laterality: N/A;   ULTRASOUND GUIDANCE FOR VASCULAR ACCESS  12/09/2018   Procedure: Ultrasound Guidance For Vascular Access;  Surgeon: Eldridge Dace,  Charlann Lange, MD;  Location: [facility];  Service: Cardiovascular;;   UMBILICAL HERNIA REPAIR  late 1990s     Current Outpatient Medications  Medication Sig Dispense Refill   acetaminophen (TYLENOL) 500 MG tablet Take 1,000 mg by mouth every 6 (six) hours as needed (pain).     amLODipine (NORVASC) 5 MG tablet Take 5 mg by mouth 2 (two) times daily.     apixaban (ELIQUIS) 5 MG TABS tablet Take 5 mg by mouth 2 (two) times daily.     cholecalciferol (VITAMIN D3) 25 MCG (1000 UNIT) tablet Take 2,000 Units by mouth daily.     HYDROcodone-acetaminophen  (NORCO) 10-325 MG tablet Take 1 tablet by mouth 4 (four) times daily as needed.     metoprolol succinate (TOPROL-XL) 25 MG 24 hr tablet TAKE 1 TABLET BY MOUTH ONCE DAILY. (Patient taking differently: Take 25 mg by mouth daily.) 90 tablet 0   promethazine (PHENERGAN) 25 MG tablet Take 25 mg by mouth every 6 (six) hours as needed for nausea or vomiting.     tamsulosin (FLOMAX) 0.4 MG CAPS capsule Take 1 capsule (0.4 mg total) by mouth daily after supper. Takes 2 capsules for kidney stone 60 capsule 11   traMADol (ULTRAM) 50 MG tablet Take 50 mg by mouth as needed.     ciprofloxacin (CIPRO) 500 MG tablet Take 1 tablet (500 mg total) by mouth every 12 (twelve) hours. 14 tablet 0   cyclobenzaprine (FLEXERIL) 10 MG tablet SMARTSIG:1 Tablet(s) By Mouth Every 12 Hours PRN     metroNIDAZOLE (FLAGYL) 500 MG tablet Take 1 tablet (500 mg total) by mouth 2 (two) times daily. 14 tablet 0   No current facility-administered medications for this visit.    Allergies:   Tramadol hcl, Demerol [meperidine], and Stadol [butorphanol]    ROS:  Please see the history of present illness.   Otherwise, review of systems are positive for none.   All other systems are reviewed and negative.    PHYSICAL EXAM: VS:  BP 118/82 (BP Location: Left Arm, Patient Position: Sitting, Cuff Size: Large)   Pulse 82   Ht 5\' 6"  (1.676 m)   Wt 183 lb 9.6 oz (83.3 kg)   SpO2 97%   BMI 29.63 kg/m  , BMI Body mass index is 29.63 kg/m. GENERAL:  Well appearing NECK:  No jugular venous distention, waveform within normal limits, carotid upstroke brisk and symmetric, no bruits, no thyromegaly LUNGS:  Clear to auscultation bilaterally CHEST:  Unremarkable HEART:  PMI not displaced or sustained,S1 and S2 within normal limits, no S3, no S4, no clicks, no rubs, 3 out of 6 apical early peaking systolic murmur radiating to slightly at the aortic outflow tract, no diastolic murmurs ABD:  Flat, positive bowel sounds normal in frequency in pitch,  no bruits, no rebound, no guarding, no midline pulsatile mass, no hepatomegaly, no splenomegaly EXT:  2 plus pulses throughout, no edema, no cyanosis no clubbing   EKG:  EKG is  ordered today. Sinus rhythm, rate 82, left axis deviation, interventricular conduction delay, left ventricular hypertrophy with repolarization changes   Recent Labs: No results found for requested labs within last 365 days.    Lipid Panel No results found for: "CHOL", "TRIG", "HDL", "CHOLHDL", "VLDL", "LDLCALC", "LDLDIRECT"    Wt Readings from Last 3 Encounters:  10/22/22 183 lb 9.6 oz (83.3 kg)  02/23/22 183 lb (83 kg)  09/14/21 186 lb (84.4 kg)      Other studies Reviewed: Additional  studies/ records that were reviewed today include: Noe Review of the above records demonstrates:  Please see elsewhere in the note.     ASSESSMENT AND PLAN:  AVR:   This was stable on echo in February and I will repeat the echocardiogram in February of next year and see him after that.  He understands endocarditis prophylaxis.  AORTIC ANEURYSM REPAIR:    This was stable on CT this summer.  No change in therapy or further imaging at this point.  HTN:  The blood pressure is at target.  No change in therapy.   ELEVATED CORONARY CALCIUM:   He had nonobstructive disease on CT angiogram.    SLEEP APNEA:   He uses CPAP.  No change in therapy.  Current medicines are reviewed at length with the patient today.  The patient does not have concerns regarding medicines.  The following changes have been made: None  Labs/ tests ordered today include:  None  No orders of the defined types were placed in this encounter.   Disposition:   FU with me in Feb.      Signed, Rollene Rotunda, MD  10/22/2022 11:19 AM    [facility]

## 2022-10-22 ENCOUNTER — Encounter: Payer: Self-pay | Admitting: Cardiology

## 2022-10-22 ENCOUNTER — Ambulatory Visit: Payer: Commercial Managed Care - PPO | Attending: Cardiology | Admitting: Cardiology

## 2022-10-22 VITALS — BP 118/82 | HR 82 | Ht 66.0 in | Wt 183.6 lb

## 2022-10-22 DIAGNOSIS — I10 Essential (primary) hypertension: Secondary | ICD-10-CM | POA: Diagnosis not present

## 2022-10-22 DIAGNOSIS — R931 Abnormal findings on diagnostic imaging of heart and coronary circulation: Secondary | ICD-10-CM | POA: Diagnosis not present

## 2022-10-22 DIAGNOSIS — Z952 Presence of prosthetic heart valve: Secondary | ICD-10-CM | POA: Diagnosis not present

## 2022-10-22 DIAGNOSIS — I4729 Other ventricular tachycardia: Secondary | ICD-10-CM

## 2022-10-22 LAB — LAB REPORT - SCANNED
A1c: 5.9
EGFR: 77

## 2022-10-22 NOTE — Patient Instructions (Signed)
Medication Instructions:  Your physician recommends that you continue on your current medications as directed. Please refer to the Current Medication list given to you today.  *If you need a refill on your cardiac medications before your next appointment, please call your pharmacy*   Lab Work: NONE If you have labs (blood work) drawn today and your tests are completely normal, you will receive your results only by: [place] (if you have MyChart) OR A paper copy in the mail If you have any lab test that is abnormal or we need to change your treatment, we will call you to review the results.   Testing/Procedures: Your physician has requested that you have an echocardiogram. Echocardiography is a painless test that uses sound waves to create images of your heart. It provides your doctor with information about the size and shape of your heart and how well your heart's chambers and valves are working. This procedure takes approximately one hour. There are no restrictions for this procedure. Please do NOT wear cologne, perfume, aftershave, or lotions (deodorant is allowed). Please arrive 15 minutes prior to your appointment time.    Follow-Up: At [facility], you and your health needs are our priority.  As part of our continuing mission to provide you with exceptional heart care, we have created designated Provider Care Teams.  These Care Teams include your primary Cardiologist (physician) and Advanced Practice Providers (APPs -  Physician Assistants and Nurse Practitioners) who all work together to provide you with the care you need, when you need it.  We recommend signing up for the patient portal called "MyChart".  Sign up information is provided on this After Visit Summary.  MyChart is used to connect with patients for Virtual Visits (Telemedicine).  Patients are able to view lab/test results, encounter notes, upcoming appointments, etc.  Non-urgent messages can be sent to your  provider as well.   To learn more about what you can do with MyChart, go to [email or web address].    Your next appointment:   4 month(s) (after his echo sometime in February)    The format for your next appointment:   In Person  Provider:   Minus Breeding, MD

## 2023-01-28 ENCOUNTER — Ambulatory Visit (HOSPITAL_COMMUNITY): Payer: Commercial Managed Care - PPO | Attending: Cardiology

## 2023-01-28 DIAGNOSIS — Z952 Presence of prosthetic heart valve: Secondary | ICD-10-CM | POA: Diagnosis present

## 2023-01-28 LAB — ECHOCARDIOGRAM COMPLETE
AV Mean grad: 15 mmHg
AV Peak grad: 26.8 mmHg
Ao pk vel: 2.59 m/s
Area-P 1/2: 3.1 cm2
S' Lateral: 3.4 cm

## 2023-02-19 ENCOUNTER — Ambulatory Visit: Payer: Commercial Managed Care - PPO | Admitting: Cardiology

## 2023-04-04 NOTE — Progress Notes (Unsigned)
  Cardiology Office Note:   Date:  04/07/2023  ID:  Cameron [name], DOB February 16, 1956, MRN [id number]  History of Present Illness:   Cameron [name] is a 67 y.o. male  who presents for follow up of aortic stenosis status post Bentall and bioprosthetic AVR.    This was functioning normally on echo in Feb 2024.   Since I last saw him he has done well.  The patient denies any new symptoms such as chest discomfort, neck or arm discomfort. There has been no new shortness of breath, PND or orthopnea. There have been no reported palpitations, presyncope or syncope.   ROS: As stated in the HPI and negative for all other systems.  Studies Reviewed:    EKG:  NSR, rate 65, left axis deviation, left ventricular hypertrophy by voltage criteria, no acute ST-T wave changes.   Risk Assessment/Calculations:         Physical Exam:   VS:  BP 138/78 (BP Location: Left Arm, Patient Position: Sitting, Cuff Size: Normal)   Pulse 65   Ht 5\' 6"  (1.676 m)   Wt 178 lb 6.4 oz (80.9 kg)   SpO2 93%   BMI 28.79 kg/m    Wt Readings from Last 3 Encounters:  04/05/23 178 lb 6.4 oz (80.9 kg)  10/22/22 183 lb 9.6 oz (83.3 kg)  02/23/22 183 lb (83 kg)     GEN: Well nourished, well developed in no acute distress NECK: No JVD; No carotid bruits CARDIAC: RRR, 3 out of 6 apical systolic murmur radiating up the aortic outflow tract and into the carotids, no diastolic murmurs, rubs, gallops RESPIRATORY:  Clear to auscultation without rales, wheezing or rhonchi  ABDOMEN: Soft, non-tender, non-distended EXTREMITIES:  No edema; No deformity   ASSESSMENT AND PLAN:   AVR:   This was stable in February of this year.  I will repeat an echocardiogram next year.   AORTIC ANEURYSM REPAIR:    This was stable on CT in 2023 and no abnormalities on echo this year.  No change in therapy.   HTN:  The blood pressure is at target.  No change in therapy.    ELEVATED CORONARY CALCIUM:   He had nonobstructive disease on CT angiogram.        SLEEP APNEA:   He uses CPAP.       Signed, Rollene Rotunda, MD

## 2023-04-05 ENCOUNTER — Ambulatory Visit: Payer: Commercial Managed Care - PPO | Attending: Cardiology | Admitting: Cardiology

## 2023-04-05 ENCOUNTER — Encounter: Payer: Self-pay | Admitting: Cardiology

## 2023-04-05 VITALS — BP 138/78 | HR 65 | Ht 66.0 in | Wt 178.4 lb

## 2023-04-05 DIAGNOSIS — I10 Essential (primary) hypertension: Secondary | ICD-10-CM | POA: Diagnosis not present

## 2023-04-05 DIAGNOSIS — Z952 Presence of prosthetic heart valve: Secondary | ICD-10-CM | POA: Diagnosis not present

## 2023-04-05 DIAGNOSIS — R931 Abnormal findings on diagnostic imaging of heart and coronary circulation: Secondary | ICD-10-CM

## 2023-04-05 NOTE — Patient Instructions (Signed)
Medication Instructions:   No changes   *If you need a refill on your cardiac medications before your next appointment, please call your pharmacy*   Lab Work: Not needed    Testing/Procedures: Will be schedule in Feb 2025 at [address] Your physician has requested that you have an echocardiogram. Echocardiography is a painless test that uses sound waves to create images of your heart. It provides your doctor with information about the size and shape of your heart and how well your heart's chambers and valves are working. This procedure takes approximately one hour. There are no restrictions for this procedure. Please do NOT wear cologne, perfume, aftershave, or lotions (deodorant is allowed). Please arrive 15 minutes prior to your appointment time.    Follow-Up: At [facility], you and your health needs are our priority.  As part of our continuing mission to provide you with exceptional heart care, we have created designated Provider Care Teams.  These Care Teams include your primary Cardiologist (physician) and Advanced Practice Providers (APPs -  Physician Assistants and Nurse Practitioners) who all work together to provide you with the care you need, when you need it.     Your next appointment:   12 month(s)  The format for your next appointment:   In Person  Provider:   Rollene Rotunda, MD

## 2023-04-07 ENCOUNTER — Encounter: Payer: Self-pay | Admitting: Cardiology

## 2023-08-27 ENCOUNTER — Ambulatory Visit (HOSPITAL_COMMUNITY)
Admission: RE | Admit: 2023-08-27 | Discharge: 2023-08-27 | Disposition: A | Discharge status: Home / Self Care | Payer: Medicare HMO | Source: Ambulatory Visit | Attending: Urology | Admitting: Urology

## 2023-08-27 DIAGNOSIS — N200 Calculus of kidney: Secondary | ICD-10-CM | POA: Insufficient documentation

## 2023-09-06 ENCOUNTER — Ambulatory Visit: Payer: Medicare HMO | Admitting: Urology

## 2023-09-06 VITALS — BP 122/71 | HR 65

## 2023-09-06 DIAGNOSIS — N138 Other obstructive and reflux uropathy: Secondary | ICD-10-CM | POA: Diagnosis not present

## 2023-09-06 DIAGNOSIS — N401 Enlarged prostate with lower urinary tract symptoms: Secondary | ICD-10-CM

## 2023-09-06 DIAGNOSIS — N200 Calculus of kidney: Secondary | ICD-10-CM

## 2023-09-06 DIAGNOSIS — R351 Nocturia: Secondary | ICD-10-CM

## 2023-09-06 LAB — MICROSCOPIC EXAMINATION
Bacteria, UA: NONE SEEN
RBC, Urine: NONE SEEN /HPF (ref 0–2)

## 2023-09-06 LAB — URINALYSIS, ROUTINE W REFLEX MICROSCOPIC
Bilirubin, UA: NEGATIVE
Glucose, UA: NEGATIVE
Ketones, UA: NEGATIVE
Nitrite, UA: NEGATIVE
Protein,UA: NEGATIVE
RBC, UA: NEGATIVE
Specific Gravity, UA: <1.005 — ABNORMAL LOW (ref 1.005–1.030)
Urobilinogen, Ur: 0.2 mg/dL (ref 0.2–1.0)
pH, UA: 7 (ref 5.0–7.5)

## 2023-09-06 MED ORDER — TAMSULOSIN HCL 0.4 MG PO CAPS: 0.4000 mg | ORAL_CAPSULE | Freq: Every day | ORAL | 3 refills | Status: DC

## 2023-09-06 NOTE — Progress Notes (Unsigned)
09/06/2023 8:56 AM   Cameron [name] 06/16/1956 [id number]  Referring provider: [name], MD [address] [place],  [place] [address]  Followup nephrolithiasis and BPH   HPI: Renal US shows possible left 4mm calculus which is nonshadowing. IPSS 5 QOl 1 on flomax 0.4mg  daily. Nocturia 0-1x. Urine stream strong.    PMH: Past Medical History:  Diagnosis Date   Aortic stenosis, severe    Arthritis    Ascending aortic aneurysm (HCC)    GERD (gastroesophageal reflux disease)    Headache    History of heart murmur in childhood    History of kidney stones    Hyperthyroidism    Impaired glucose tolerance    Nephrolithiasis    Sleep apnea    On CPAP    Surgical History: Past Surgical History:  Procedure Laterality Date   BENTALL PROCEDURE N/A 12/22/2018   Procedure: BENTALL PROCEDURE;  Surgeon: Alleen Borne, MD;  Location: [facility] OR;  Service: Open Heart Surgery;  Laterality: N/A;  NEEDS BILATERAL RADIAL ARTERIAL LINES   CARDIAC CATHETERIZATION  1990s   CYSTOSCOPY  1990s   With stone extraction   CYSTOSCOPY W/ URETERAL STENT PLACEMENT Left 12/02/2015   Procedure: CYSTOSCOPY WITH RETROGRADE PYELOGRAM/URETERAL STENT PLACEMENT;  Surgeon: Malen Gauze, MD;  Location: [facility] ORS;  Service: Urology;  Laterality: Left;   ESOPHAGOGASTRODUODENOSCOPY  1990s   KNEE ARTHROSCOPY Left 2005   LITHOTRIPSY  1990s   LITHOTRIPSY Left 11/2015   REPLACEMENT ASCENDING AORTA N/A 12/22/2018   Procedure: REPLACEMENT ASCENDING AORTA;  Surgeon: Alleen Borne, MD;  Location: [facility] OR;  Service: Open Heart Surgery;  Laterality: N/A;   RIGHT/LEFT HEART CATH AND CORONARY ANGIOGRAPHY N/A 12/09/2018   Procedure: RIGHT/LEFT HEART CATH AND CORONARY ANGIOGRAPHY;  Surgeon: Corky Crafts, MD;  Location: [facility] INVASIVE CV LAB;  Service: Cardiovascular;  Laterality: N/A;   TEE WITHOUT CARDIOVERSION N/A 12/22/2018   Procedure: TRANSESOPHAGEAL ECHOCARDIOGRAM (TEE);  Surgeon: Alleen Borne, MD;   Location[facility] OR;  Service: Open Heart Surgery;  Laterality: N/A;   ULTRASOUND GUIDANCE FOR VASCULAR ACCESS  12/09/2018   Procedure: Ultrasound Guidance For Vascular Access;  Surgeon: Corky Crafts, MD;  Location: [facility] INVASIVE CV LAB;  Service: Cardiovascular;;   UMBILICAL HERNIA REPAIR  late 1990s    Home Medications:  Allergies as of 09/06/2023       Reactions   Tramadol Hcl Itching   Patient tolerates specific NDC generic product - family is able to bring supply in from home.    Demerol [meperidine] Anxiety, Other (See Comments)   Makes pt hyper, the higher the dose the more hyper pt gets   Stadol [butorphanol] Anxiety, Other (See Comments)   Hyper- the higher the dose the more hyper pt gets        Medication List        Accurate as of September 06, 2023  8:56 AM. If you have any questions, ask your nurse or doctor.          acetaminophen 500 MG tablet Commonly known as: TYLENOL Take 1,000 mg by mouth every 6 (six) hours as needed (pain).   amLODipine 5 MG tablet Commonly known as: NORVASC Take 5 mg by mouth 2 (two) times daily.   apixaban 5 MG Tabs tablet Commonly known as: ELIQUIS Take 5 mg by mouth 2 (two) times daily.   cholecalciferol 25 MCG (1000 UNIT) tablet Commonly known as: VITAMIN D3 Take 2,000 Units by mouth daily.   cyclobenzaprine 10  MG tablet Commonly known as: FLEXERIL SMARTSIG:1 Tablet(s) By Mouth Every 12 Hours PRN   HYDROcodone-acetaminophen 10-325 MG tablet Commonly known as: NORCO Take 1 tablet by mouth 4 (four) times daily as needed.   LORazepam 1 MG tablet Commonly known as: ATIVAN Take 1 mg by mouth at bedtime.   metoprolol succinate 25 MG 24 hr tablet Commonly known as: TOPROL-XL TAKE 1 TABLET BY MOUTH ONCE DAILY.   promethazine 25 MG tablet Commonly known as: PHENERGAN Take 25 mg by mouth every 6 (six) hours as needed for nausea or vomiting.   tamsulosin 0.4 MG Caps capsule Commonly known as: Flomax Take 1 capsule  (0.4 mg total) by mouth daily after supper. Takes 2 capsules for kidney stone   traMADol 50 MG tablet Commonly known as: ULTRAM Take 50 mg by mouth as needed.        Allergies:  Allergies  Allergen Reactions   Tramadol Hcl Itching    Patient tolerates specific NDC generic product - family is able to bring supply in from home.    Demerol [Meperidine] Anxiety and Other (See Comments)    Makes pt hyper, the higher the dose the more hyper pt gets   Stadol [Butorphanol] Anxiety and Other (See Comments)    Hyper- the higher the dose the more hyper pt gets    Family History: Family History  Problem Relation Age of Onset   Depression Mother    Hyperlipidemia Mother    Cancer Father    Kidney disease Father    Breast cancer Maternal Grandmother    Heart disease Maternal Grandfather     Social History:  reports that he has never smoked. He has been exposed to tobacco smoke. He has never used smokeless tobacco. He reports current alcohol use. He reports that he does not use drugs.  ROS: All other review of systems were reviewed and are negative except what is noted above in HPI  Physical Exam: BP 122/71   Pulse 65   Constitutional:  Alert and oriented, No acute distress. HEENT: [place] AT, moist mucus membranes.  Trachea midline, no masses. Cardiovascular: No clubbing, cyanosis, or edema. Respiratory: Normal respiratory effort, no increased work of breathing. GI: Abdomen is soft, nontender, nondistended, no abdominal masses GU: No CVA tenderness.  Lymph: No cervical or inguinal lymphadenopathy. Skin: No rashes, bruises or suspicious lesions. Neurologic: Grossly intact, no focal deficits, moving all 4 extremities. Psychiatric: Normal mood and affect.  Laboratory Data: Lab Results  Component Value Date   WBC 8.9 12/27/2018   HGB 9.1 (L) 12/27/2018   HCT 28.6 (L) 12/27/2018   MCV 93.2 12/27/2018   PLT 190 12/27/2018    Lab Results  Component Value Date   CREATININE 1.12  08/18/2021    No results found for: "PSA"  No results found for: "TESTOSTERONE"  Lab Results  Component Value Date   HGBA1C 5.4 12/19/2018    Urinalysis    Component Value Date/Time   COLORURINE YELLOW 12/19/2018 1202   APPEARANCEUR Clear 09/07/2022 0956   LABSPEC 1.017 12/19/2018 1202   PHURINE 6.0 12/19/2018 1202   GLUCOSEU Negative 09/07/2022 0956   HGBUR NEGATIVE 12/19/2018 1202   BILIRUBINUR Negative 09/07/2022 0956   KETONESUR NEGATIVE 12/19/2018 1202   PROTEINUR Negative 09/07/2022 0956   PROTEINUR NEGATIVE 12/19/2018 1202   NITRITE Negative 09/07/2022 0956   NITRITE NEGATIVE 12/19/2018 1202   LEUKOCYTESUR Trace (A) 09/07/2022 0956    Lab Results  Component Value Date   LABMICR See below: 09/07/2022  WBCUA 0-5 09/07/2022   LABEPIT 0-10 09/07/2022   BACTERIA Few 09/07/2022    Pertinent Imaging: *** Results for orders placed during the hospital encounter of 08/07/16  DG Abd 1 View  Narrative CLINICAL DATA:  Kidney stones.  EXAM: ABDOMEN - 1 VIEW  COMPARISON:  07/03/2016.  06/18/2016.  03/15/2016 .  FINDINGS: Soft tissue structures are unremarkable. Small left renal stones noted. Pelvic calcifications are again noted and unchanged in appearance. These are most likely phleboliths. Again distal ureteral calculus, particular the left ureteric cannot be excluded.  IMPRESSION: 1. Stable left nephrolithiasis.  2. Stable pelvic calcifications, most likely phleboliths. Distal ureteral stones, particular in the distal left ureter again cannot be excluded.   Electronically Signed By: Maisie Fus  Register On: 08/08/2016 08:23  No results found for this or any previous visit.  No results found for this or any previous visit.  No results found for this or any previous visit.  Results for orders placed during the hospital encounter of 08/27/23  Ultrasound renal complete  Narrative CLINICAL DATA:  Nephrolithiasis  EXAM: RENAL / URINARY TRACT  ULTRASOUND COMPLETE  COMPARISON:  Renal ultrasound 09/03/2022  FINDINGS: Right Kidney:  Renal measurements: 10.0 x 5.7 x 6.1 cm = volume: 182.5 mL. Echogenicity within normal limits. No mass or hydronephrosis visualized.  Left Kidney:  Renal measurements: 11.8 x 6.1 x 6.5 cm = volume: 244.7 mL. Echogenicity within normal limits. No mass or hydronephrosis visualized. There is a 4 mm stone midpole left kidney.  Bladder:  Appears normal for degree of bladder distention.  Other:  None.  IMPRESSION: 1. No hydronephrosis. 2. Left nephrolithiasis.   Electronically Signed By: Annia Belt M.D. On: 08/27/2023 12:40  No valid procedures specified. No results found for this or any previous visit.  Results for orders placed during the hospital encounter of 03/05/22  CT RENAL STONE STUDY  Narrative CLINICAL DATA:  Right flank pain for 5 weeks. History of nephrolithiasis.  EXAM: CT ABDOMEN AND PELVIS WITHOUT CONTRAST  TECHNIQUE: Multidetector CT imaging of the abdomen and pelvis was performed following the standard protocol without IV contrast.  RADIATION DOSE REDUCTION: This exam was performed according to the departmental dose-optimization program which includes automated exposure control, adjustment of the mA and/or kV according to patient size and/or use of iterative reconstruction technique.  COMPARISON:  12/01/2015 CT abdomen/pelvis. 05/29/2017 CT angiogram of the chest, abdomen and pelvis.  FINDINGS: Lower chest: No significant pulmonary nodules or acute consolidative airspace disease. Intact visualized lower sternotomy wire. Small amount of fluid in the lower thoracic esophagus.  Hepatobiliary: Mild diffuse hepatic steatosis. Normal liver size. No definite liver surface irregularity. No liver masses. Normal gallbladder with no radiopaque cholelithiasis. No biliary ductal dilatation.  Pancreas: Normal, with no mass or duct dilation.  Spleen: Normal  size. No mass.  Adrenals/Urinary Tract: Normal adrenals. Nonobstructing 2 mm lower right renal stone. Nonobstructing 2 mm interpolar and 1 mm lower left renal stones. No hydronephrosis. Normal caliber ureters. No ureteral stones. No contour deforming renal masses. Normal bladder.  Stomach/Bowel: Probable small hiatal hernia. Otherwise normal nondistended stomach. Normal caliber small bowel with no small bowel wall thickening. Normal appendix. Marked sigmoid diverticulosis. Mild wall thickening with associated mild pericolonic fat stranding in the mid sigmoid colon (series 2/image 60), compatible with mild acute sigmoid diverticulitis. No pericolonic free air or abscess.  Vascular/Lymphatic: Atherosclerotic nonaneurysmal abdominal aorta. No pathologically enlarged lymph nodes in the abdomen or pelvis.  Reproductive: Moderate prostatomegaly. Nonspecific heterogeneous internal prostatic calcifications.  Other: No pneumoperitoneum, ascites or focal fluid collection. Small fat containing right inguinal hernia.  Musculoskeletal: No aggressive appearing focal osseous lesions. Mild thoracolumbar spondylosis.  IMPRESSION: 1. Mild acute sigmoid diverticulitis. No free air or abscess. 2. Tiny nonobstructing bilateral nephrolithiasis. No hydronephrosis. No ureteral or bladder stones. 3. Moderate prostatomegaly. 4. Mild diffuse hepatic steatosis. 5. Probable small hiatal hernia. Small amount of fluid in the lower thoracic esophagus, suggesting esophageal dysmotility and/or gastroesophageal reflux. 6. Small fat containing right inguinal hernia. 7. Aortic Atherosclerosis (ICD10-I70.0).  These results will be called to the ordering clinician or representative by the Radiology Department at the imaging location.   Electronically Signed By: Delbert Phenix M.D. On: 03/05/2022 12:44   Assessment & Plan:    1. Kidney stones *** - Urinalysis, Routine w reflex microscopic  2. Benign  prostatic hyperplasia with urinary obstruction ***  3. Nocturia ***   No follow-ups on file.  Wilkie Aye, MD  [facility] Urology

## 2023-09-10 ENCOUNTER — Encounter: Payer: Self-pay | Admitting: Urology

## 2023-09-10 NOTE — Patient Instructions (Signed)

## 2023-10-21 LAB — LAB REPORT - SCANNED
A1c: 6.2
EGFR: 82

## 2024-02-10 ENCOUNTER — Ambulatory Visit (HOSPITAL_COMMUNITY): Payer: Medicare HMO | Attending: Cardiology

## 2024-02-10 DIAGNOSIS — Z952 Presence of prosthetic heart valve: Secondary | ICD-10-CM

## 2024-02-10 DIAGNOSIS — R931 Abnormal findings on diagnostic imaging of heart and coronary circulation: Secondary | ICD-10-CM

## 2024-02-10 LAB — ECHOCARDIOGRAM COMPLETE
AR max vel: 1.13 cm2
AV Area VTI: 1.25 cm2
AV Area mean vel: 1.16 cm2
AV Mean grad: 20 mm[Hg]
AV Peak grad: 38.9 mm[Hg]
Ao pk vel: 3.12 m/s
Area-P 1/2: 2.78 cm2
S' Lateral: 3.4 cm

## 2024-02-10 MED ORDER — PERFLUTREN LIPID MICROSPHERE
3.0000 mL | INTRAVENOUS | Status: AC | PRN
  Administered 2024-02-10: 3 mL via INTRAVENOUS

## 2024-04-19 NOTE — Progress Notes (Unsigned)
  Cardiology Office Note:   Date:  04/20/2024  ID:  Cameron [name], DOB Apr 13, 1956, MRN [id number] PCP: Medicine, Autumn Lehmann  [facility] Providers Cardiologist:  Eilleen Grates, MD {  History of Present Illness:   Cameron [name] is a 68 y.o. male  who presents for follow up of aortic stenosis status post Bent[place] and bioprosthetic AVR.     This was functioning normally on echo in Feb 2025.  He denies any new cardiovascular symptoms.  The patient denies any new symptoms such as chest discomfort, neck or arm discomfort. There has been no new shortness of breath, PND or orthopnea. There have been no reported palpitations, presyncope or syncope.      ROS: As stated in the HPI and negative for all other systems.  Studies Reviewed:    EKG:   EKG Interpretation Date/Time:  Monday April 20 2024 08:59:00 EDT Ventricular Rate:  66 PR Interval:  190 QRS Duration:  106 QT Interval:  396 QTC Calculation: 415 R Axis:   -38  Text Interpretation: Normal sinus rhythm Left axis deviation Left ventricular hypertrophy When compared with ECG of 23-Dec-2018 07:28, QRS duration has increased Confirmed by Eilleen Grates ([id number]) on 04/20/2024 9:08:57 AM    Risk Assessment/Calculations:              Physical Exam:   VS:  BP 128/74 (BP Location: Left Arm, Patient Position: Sitting)   Pulse 66   Ht 5\' 6"  (1.676 m)   Wt 182 lb 12.8 oz (82.9 kg)   SpO2 99%   BMI 29.50 kg/m    Wt Readings from Last 3 Encounters:  04/20/24 182 lb 12.8 oz (82.9 kg)  04/05/23 178 lb 6.4 oz (80.9 kg)  10/22/22 183 lb 9.6 oz (83.3 kg)     GEN: Well nourished, well developed in no acute distress NECK: No JVD; No carotid bruits CARDIAC: RRR, 2 out of 6 apical systolic murmur radiating slightly at aortic outflow tract, no diastolic murmurs, rubs, gallops RESPIRATORY:  Clear to auscultation without rales, wheezing or rhonchi  ABDOMEN: Soft, non-tender, non-distended EXTREMITIES:  No edema; No deformity    ASSESSMENT AND PLAN:   AVR:   This was stable in February.  No change in therapy.  We talked about getting some dental work done because he has not been in several years.  He understands the importance of this and endocarditis prophylaxis.  AORTIC ANEURYSM REPAIR:   He has CT with stable repair in June 2023.  No change in therapy.  HTN:   His blood pressure is at target.  No change in therapy.   ELEVATED CORONARY CALCIUM: He is going to continue with risk reduction.  I did review his most recent labs.  His HDL was 49 with an LDL of 213. No change in therapy.    SLEEP APNEA: He uses CPAP.  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  I have asked him to get his CBC checked twice a year.       Follow up with me in 1 year  Signed, Eilleen Grates, MD

## 2024-04-20 ENCOUNTER — Other Ambulatory Visit: Payer: Self-pay

## 2024-04-20 ENCOUNTER — Ambulatory Visit: Payer: Medicare HMO | Attending: Cardiology | Admitting: Cardiology

## 2024-04-20 ENCOUNTER — Encounter: Payer: Self-pay | Admitting: Cardiology

## 2024-04-20 VITALS — BP 128/74 | HR 66 | Ht 66.0 in | Wt 182.8 lb

## 2024-04-20 DIAGNOSIS — I10 Essential (primary) hypertension: Secondary | ICD-10-CM | POA: Diagnosis not present

## 2024-04-20 DIAGNOSIS — N138 Other obstructive and reflux uropathy: Secondary | ICD-10-CM | POA: Diagnosis not present

## 2024-04-20 DIAGNOSIS — Z79899 Other long term (current) drug therapy: Secondary | ICD-10-CM

## 2024-04-20 DIAGNOSIS — N401 Enlarged prostate with lower urinary tract symptoms: Secondary | ICD-10-CM

## 2024-04-20 MED ORDER — TAMSULOSIN HCL 0.4 MG PO CAPS: 0.4000 mg | ORAL_CAPSULE | Freq: Every day | ORAL | 3 refills | Status: DC

## 2024-04-20 MED ORDER — AMLODIPINE BESYLATE 5 MG PO TABS
5.0000 mg | ORAL_TABLET | Freq: Two times a day (BID) | ORAL | 3 refills | Status: AC
Start: 2024-04-20 — End: ?

## 2024-04-20 MED ORDER — VITAMIN D 25 MCG (1000 UNIT) PO TABS
2000.0000 [IU] | ORAL_TABLET | Freq: Every day | ORAL | 3 refills | Status: AC
Start: 2024-04-20 — End: ?

## 2024-04-20 MED ORDER — CYCLOBENZAPRINE HCL 10 MG PO TABS
ORAL_TABLET | ORAL | 3 refills | Status: AC
Start: 2024-04-20 — End: ?

## 2024-04-20 MED ORDER — APIXABAN 5 MG PO TABS: 5.0000 mg | ORAL_TABLET | Freq: Two times a day (BID) | ORAL | 3 refills | Status: AC

## 2024-04-20 MED ORDER — PROMETHAZINE HCL 25 MG PO TABS: 25.0000 mg | ORAL_TABLET | Freq: Four times a day (QID) | ORAL | 3 refills | Status: DC | PRN

## 2024-04-20 MED ORDER — METOPROLOL SUCCINATE ER 25 MG PO TB24
25.0000 mg | ORAL_TABLET | Freq: Every day | ORAL | 0 refills | Status: DC
Start: 2024-04-20 — End: 2024-08-06

## 2024-04-20 MED ORDER — ACETAMINOPHEN 500 MG PO TABS: 1000.0000 mg | ORAL_TABLET | Freq: Four times a day (QID) | ORAL | 3 refills | Status: AC | PRN

## 2024-04-20 NOTE — Patient Instructions (Signed)
 Medication Instructions:  Your physician recommends that you continue on your current medications as directed. Please refer to the Current Medication list given to you today.  *If you need a refill on your cardiac medications before your next appointment, please call your pharmacy*  Follow-Up: At [facility], you and your health needs are our priority.  As part of our continuing mission to provide you with exceptional heart care, our providers are all part of one team.  This team includes your primary Cardiologist (physician) and Advanced Practice Providers or APPs (Physician Assistants and Nurse Practitioners) who all work together to provide you with the care you need, when you need it.  Your next appointment:   1 year(s)  Provider:   Eilleen Grates, MD

## 2024-04-20 NOTE — Telephone Encounter (Signed)
 Prescription refill request for Eliquis received. Indication:avr Last office visit:4/25 WNU:UVOZD labs Age: 68 Weight:82.9  kg  Prescription refilled

## 2024-04-21 ENCOUNTER — Other Ambulatory Visit: Payer: Self-pay | Admitting: Cardiology

## 2024-04-21 LAB — CBC
HCT: 43.9 % (ref 38.5–50.0)
Hemoglobin: 15.3 g/dL (ref 13.2–17.1)
MCH: 29.5 pg (ref 27.0–33.0)
MCHC: 34.9 g/dL (ref 32.0–36.0)
MCV: 84.7 fL (ref 80.0–100.0)
MPV: 11.7 fL (ref 7.5–12.5)
Platelets: 154 10*3/uL (ref 140–400)
RBC: 5.18 10*6/uL (ref 4.20–5.80)
RDW: 13.5 % (ref 11.0–15.0)
WBC: 9.1 10*3/uL (ref 3.8–10.8)

## 2024-04-22 ENCOUNTER — Encounter: Payer: Self-pay | Admitting: *Deleted

## 2024-07-28 NOTE — Progress Notes (Unsigned)
 New Patient Office Visit  Subjective   Patient ID: Cameron [name], male    DOB: 04-Feb-1956  Age: 68 y.o. MRN: [id number]  CC: No chief complaint on file.   HPI Cameron [name] presents to establish care ***  Outpatient Encounter Medications as of 07/29/2024  Medication Sig   acetaminophen  (TYLENOL ) 500 MG tablet Take 2 tablets (1,000 mg total) by mouth every 6 (six) hours as needed (pain).   amLODipine  (NORVASC ) 5 MG tablet Take 1 tablet (5 mg total) by mouth 2 (two) times daily.   apixaban  (ELIQUIS ) 5 MG TABS tablet Take 1 tablet (5 mg total) by mouth 2 (two) times daily.   cholecalciferol (VITAMIN D3) 25 MCG (1000 UNIT) tablet Take 2 tablets (2,000 Units total) by mouth daily.   cyclobenzaprine  (FLEXERIL ) 10 MG tablet SMARTSIG:1 Tablet(s) By Mouth Every 12 Hours PRN   HYDROcodone-acetaminophen  (NORCO) 10-325 MG tablet Take 1 tablet by mouth 4 (four) times daily as needed.   LORazepam (ATIVAN) 1 MG tablet Take 1 mg by mouth at bedtime.   metoprolol  succinate (TOPROL -XL) 25 MG 24 hr tablet Take 1 tablet (25 mg total) by mouth daily.   promethazine  (PHENERGAN ) 25 MG tablet Take 1 tablet (25 mg total) by mouth every 6 (six) hours as needed for nausea or vomiting.   tamsulosin  (FLOMAX ) 0.4 MG CAPS capsule Take 1 capsule (0.4 mg total) by mouth at bedtime. Takes 2 capsules for kidney stone   traMADol  (ULTRAM ) 50 MG tablet Take 50 mg by mouth as needed.   No facility-administered encounter medications on file as of 07/29/2024.    Past Medical History:  Diagnosis Date   Aortic stenosis, severe    Arthritis    Ascending aortic aneurysm (HCC)    GERD (gastroesophageal reflux disease)    Headache    History of heart murmur in childhood    History of kidney stones    Hyperthyroidism    Impaired glucose tolerance    Nephrolithiasis    Sleep apnea    On CPAP    Past Surgical History:  Procedure Laterality Date   BENTALL PROCEDURE N/A 12/22/2018   Procedure: BENTALL PROCEDURE;   Surgeon: Cameron Dorise POUR, MD;  Location: [facility] OR;  Service: Open Heart Surgery;  Laterality: N/A;  NEEDS BILATERAL RADIAL ARTERIAL LINES   CARDIAC CATHETERIZATION  1990s   CYSTOSCOPY  1990s   With stone extraction   CYSTOSCOPY W/ URETERAL STENT PLACEMENT Left 12/02/2015   Procedure: CYSTOSCOPY WITH RETROGRADE PYELOGRAM/URETERAL STENT PLACEMENT;  Surgeon: Cameron LITTIE Clara, MD;  Location: [facility] ORS;  Service: Urology;  Laterality: Left;   ESOPHAGOGASTRODUODENOSCOPY  1990s   KNEE ARTHROSCOPY Left 2005   LITHOTRIPSY  1990s   LITHOTRIPSY Left 11/2015   REPLACEMENT ASCENDING AORTA N/A 12/22/2018   Procedure: REPLACEMENT ASCENDING AORTA;  Surgeon: Cameron Dorise POUR, MD;  Location: [facility] OR;  Service: Open Heart Surgery;  Laterality: N/A;   RIGHT/LEFT HEART CATH AND CORONARY ANGIOGRAPHY N/A 12/09/2018   Procedure: RIGHT/LEFT HEART CATH AND CORONARY ANGIOGRAPHY;  Surgeon: Cameron Candyce RAMAN, MD;  Location: [facility] INVASIVE CV LAB;  Service: Cardiovascular;  Laterality: N/A;   TEE WITHOUT CARDIOVERSION N/A 12/22/2018   Procedure: TRANSESOPHAGEAL ECHOCARDIOGRAM (TEE);  Surgeon: Cameron Dorise POUR, MD;  Location: [facility] OR;  Service: Open Heart Surgery;  Laterality: N/A;   ULTRASOUND GUIDANCE FOR VASCULAR ACCESS  12/09/2018   Procedure: Ultrasound Guidance For Vascular Access;  Surgeon: Cameron Candyce RAMAN, MD;  Location: [facility] INVASIVE CV LAB;  Service: Cardiovascular;;  UMBILICAL HERNIA REPAIR  late 1990s    Family History  Problem Relation Age of Onset   Depression Mother    Hyperlipidemia Mother    Cancer Father    Kidney disease Father    Breast cancer Maternal Grandmother    Heart disease Maternal Grandfather     Social History   Socioeconomic History   Marital status: Married    Spouse name: Cameron [name]   Number of children: Not on file   Years of education: Not on file   Highest education level: Some college, no degree  Occupational History   Not on file  Tobacco Use   Smoking status: Never    Passive  exposure: Yes   Smokeless tobacco: Never  Vaping Use   Vaping status: Never Used  Substance and Sexual Activity   Alcohol use: Yes    Comment: Socially   Drug use: Never   Sexual activity: Yes  Other Topics Concern   Not on file  Social History Narrative   Not on file   Social Drivers of Health   Financial Resource Strain: Low Risk  (07/26/2024)   Overall Financial Resource Strain (CARDIA)    Difficulty of Paying Living Expenses: Not hard at all  Food Insecurity: No Food Insecurity (07/26/2024)   Hunger Vital Sign    Worried About Running Out of Food in the Last Year: Never true    Ran Out of Food in the Last Year: Never true  Transportation Needs: No Transportation Needs (07/26/2024)   PRAPARE - Administrator, Civil Service (Medical): No    Lack of Transportation (Non-Medical): No  Physical Activity: Insufficiently Active (07/26/2024)   Exercise Vital Sign    Days of Exercise per Week: 5 days    Minutes of Exercise per Session: 20 min  Stress: Stress Concern Present (07/26/2024)   [employer] of Occupational Health - Occupational Stress Questionnaire    Feeling of Stress: To some extent  Social Connections: Unknown (07/26/2024)   Social Connection and Isolation Panel    Frequency of Communication with Friends and Family: More than three times a week    Frequency of Social Gatherings with Friends and Family: Patient declined    Attends Religious Services: Patient declined    Database administrator or Organizations: Patient declined    Attends Banker Meetings: Not on file    Marital Status: Married  Intimate Partner Violence: Unknown (09/30/2023)   Received from [facility]   HITS    Physically Hurt: Not on file    Insult or Talk Down To: Not on file    Threaten Physical Harm: Not on file    Scream or Curse: Not on file    ROS Negative unless indicated in HPI    Objective   There were no vitals taken for this visit.  Physical Exam  Last  CBC Lab Results  Component Value Date   WBC 9.1 04/20/2024   HGB 15.3 04/20/2024   HCT 43.9 04/20/2024   MCV 84.7 04/20/2024   MCH 29.5 04/20/2024   RDW 13.5 04/20/2024   PLT 154 04/20/2024   Last metabolic panel Lab Results  Component Value Date   GLUCOSE 106 08/18/2021   NA 138 08/18/2021   K 3.8 08/18/2021   CL 104 08/18/2021   CO2 25 08/18/2021   BUN 14 08/18/2021   CREATININE 1.12 08/18/2021   EGFR 82.0 10/21/2023   CALCIUM 9.1 08/18/2021   PROT 6.7 12/19/2018   ALBUMIN   4.2 12/19/2018   BILITOT 0.9 12/19/2018   ALKPHOS 38 12/19/2018   AST 20 12/19/2018   ALT 22 12/19/2018   ANIONGAP 8 12/27/2018    Last hemoglobin A1c Lab Results  Component Value Date   HGBA1C 5.4 12/19/2018         Assessment & Plan:  There are no diagnoses linked to this encounter. Continue healthy lifestyle choices, including diet (rich in fruits, vegetables, and lean proteins, and low in salt and simple carbohydrates) and exercise (at least 30 minutes of moderate physical activity daily).     The above assessment and management plan was discussed with the patient. The patient verbalized understanding of and has agreed to the management plan. Patient is aware to call the clinic if they develop any new symptoms or if symptoms persist or worsen. Patient is aware when to return to the clinic for a follow-up visit. Patient educated on when it is appropriate to go to the emergency department.  No follow-ups on file.   Cameron Bouch [place] Thompson, DNP [facility] [address] [place], [place] [address] [phone]  Note: This document was prepared by [employer] voice dictation technology and any errors that results from this process are unintentional.

## 2024-07-29 ENCOUNTER — Ambulatory Visit: Admitting: Nurse Practitioner

## 2024-07-29 ENCOUNTER — Encounter: Payer: Self-pay | Admitting: Nurse Practitioner

## 2024-07-29 VITALS — BP 144/78 | HR 63 | Temp 97.9°F | Ht 66.0 in | Wt 186.8 lb

## 2024-07-29 DIAGNOSIS — Z0001 Encounter for general adult medical examination with abnormal findings: Secondary | ICD-10-CM | POA: Insufficient documentation

## 2024-07-29 DIAGNOSIS — E66811 Obesity, class 1: Secondary | ICD-10-CM

## 2024-07-29 DIAGNOSIS — I4729 Other ventricular tachycardia: Secondary | ICD-10-CM

## 2024-07-29 DIAGNOSIS — R351 Nocturia: Secondary | ICD-10-CM

## 2024-07-29 DIAGNOSIS — Z95811 Presence of heart assist device: Secondary | ICD-10-CM

## 2024-07-29 DIAGNOSIS — I10 Essential (primary) hypertension: Secondary | ICD-10-CM | POA: Diagnosis not present

## 2024-07-29 DIAGNOSIS — G4730 Sleep apnea, unspecified: Secondary | ICD-10-CM

## 2024-07-29 DIAGNOSIS — I711 Thoracic aortic aneurysm, ruptured, unspecified: Secondary | ICD-10-CM

## 2024-07-29 DIAGNOSIS — Z6830 Body mass index (BMI) 30.0-30.9, adult: Secondary | ICD-10-CM

## 2024-08-04 ENCOUNTER — Other Ambulatory Visit: Payer: Self-pay | Admitting: Cardiology

## 2024-08-10 ENCOUNTER — Ambulatory Visit (HOSPITAL_COMMUNITY)
Admission: RE | Admit: 2024-08-10 | Discharge: 2024-08-10 | Disposition: A | Discharge status: Home / Self Care | Source: Ambulatory Visit | Attending: Urology | Admitting: Urology

## 2024-08-10 DIAGNOSIS — N200 Calculus of kidney: Secondary | ICD-10-CM | POA: Diagnosis present

## 2024-08-20 ENCOUNTER — Encounter: Payer: Self-pay | Admitting: Urology

## 2024-08-25 ENCOUNTER — Ambulatory Visit: Payer: Self-pay | Admitting: Urology

## 2024-09-10 ENCOUNTER — Other Ambulatory Visit: Payer: Self-pay | Admitting: Cardiology

## 2024-09-11 ENCOUNTER — Other Ambulatory Visit: Payer: Self-pay | Admitting: Cardiology

## 2024-09-11 ENCOUNTER — Ambulatory Visit: Payer: Medicare HMO | Admitting: Urology

## 2024-09-11 ENCOUNTER — Encounter: Payer: Self-pay | Admitting: Urology

## 2024-09-11 VITALS — BP 138/75 | HR 64

## 2024-09-11 DIAGNOSIS — N401 Enlarged prostate with lower urinary tract symptoms: Secondary | ICD-10-CM | POA: Diagnosis not present

## 2024-09-11 DIAGNOSIS — R351 Nocturia: Secondary | ICD-10-CM

## 2024-09-11 DIAGNOSIS — N200 Calculus of kidney: Secondary | ICD-10-CM

## 2024-09-11 DIAGNOSIS — N138 Other obstructive and reflux uropathy: Secondary | ICD-10-CM | POA: Diagnosis not present

## 2024-09-11 LAB — URINALYSIS, ROUTINE W REFLEX MICROSCOPIC
Bilirubin, UA: NEGATIVE
Glucose, UA: NEGATIVE
Ketones, UA: NEGATIVE
Leukocytes,UA: NEGATIVE
Nitrite, UA: NEGATIVE
Protein,UA: NEGATIVE
RBC, UA: NEGATIVE
Specific Gravity, UA: 1.02 (ref 1.005–1.030)
Urobilinogen, Ur: 0.2 mg/dL (ref 0.2–1.0)
pH, UA: 6.5 (ref 5.0–7.5)

## 2024-09-11 MED ORDER — TAMSULOSIN HCL 0.4 MG PO CAPS
0.4000 mg | ORAL_CAPSULE | Freq: Every day | ORAL | 3 refills | Status: AC
Start: 2024-09-11 — End: ?

## 2024-09-11 NOTE — Telephone Encounter (Signed)
 FOR REVIEW: Would you like to continue prescribing muscle relaxer

## 2024-09-11 NOTE — Progress Notes (Signed)
 09/11/2024 8:43 AM   Cameron [name] 1956/09/11 [id number]  Referring provider: Medicine, [facility] [address] [place],  [place] [address]  Followup nephrolithiasis  HPI: [name] is a 35bn here for followup for nephrolithiasis and BPh with nocturia. IPSS 3 QOl 0 on flomax  0.4mg  daily. Urine stream strong. Nocturia 1x. No straining to urinate. He passed a stone 4 weeks ago on the right side. Renal US  showed a 4mm right lower pole renal calculus. He drinks 48-64 oz of water daily.    PMH: Past Medical History:  Diagnosis Date   Allergy Unsure   In Med List   Aortic stenosis, severe    Arthritis    Ascending aortic aneurysm (HCC)    GERD (gastroesophageal reflux disease)    Headache    Heart murmur    History of heart murmur in childhood    History of kidney stones    Hypertension    Hyperthyroidism    Impaired glucose tolerance    Nephrolithiasis    Sleep apnea    On CPAP   Stroke [facility])     Surgical History: Past Surgical History:  Procedure Laterality Date   BENTALL PROCEDURE N/A 12/22/2018   Procedure: BENTALL PROCEDURE;  Surgeon: Lucas Dorise POUR, MD;  [facility];  Service: Open Heart Surgery;  Laterality: N/A;  NEEDS BILATERAL RADIAL ARTERIAL LINES   CARDIAC CATHETERIZATION  1990s   CARDIAC VALVE REPLACEMENT  12/22/2018   CYSTOSCOPY  1990s   With stone extraction   CYSTOSCOPY W/ URETERAL STENT PLACEMENT Left 12/02/2015   Procedure: CYSTOSCOPY WITH RETROGRADE PYELOGRAM/URETERAL STENT PLACEMENT;  Surgeon: Belvie LITTIE Clara, MD;  [facility]: [facility] ORS;  Service: Urology;  Laterality: Left;   ESOPHAGOGASTRODUODENOSCOPY  1990s   HERNIA REPAIR  @[facility]   1990s?   KNEE ARTHROSCOPY Left 2005   LITHOTRIPSY  1990s   LITHOTRIPSY Left 11/2015   REPLACEMENT ASCENDING AORTA N/A 12/22/2018   Procedure: REPLACEMENT ASCENDING AORTA;  Surgeon: Lucas Dorise POUR, MD;  [facility];  Service: Open Heart Surgery;  Laterality: N/A;   RIGHT/LEFT HEART CATH AND  CORONARY ANGIOGRAPHY N/A 12/09/2018   Procedure: RIGHT/LEFT HEART CATH AND CORONARY ANGIOGRAPHY;  Surgeon: Dann Candyce RAMAN, MD;  Location: [facility] INVASIVE CV LAB;  Service: Cardiovascular;  Laterality: N/A;   TEE WITHOUT CARDIOVERSION N/A 12/22/2018   Procedure: TRANSESOPHAGEAL ECHOCARDIOGRAM (TEE);  Surgeon: Lucas Dorise POUR, MD;  Location: [facility] OR;  Service: Open Heart Surgery;  Laterality: N/A;   ULTRASOUND GUIDANCE FOR VASCULAR ACCESS  12/09/2018   Procedure: Ultrasound Guidance For Vascular Access;  Surgeon: Dann Candyce RAMAN, MD;  Location: [facility] INVASIVE CV LAB;  Service: Cardiovascular;;   UMBILICAL HERNIA REPAIR  late 1990s    Home Medications:  Allergies as of 09/11/2024       Reactions   Tramadol  Hcl Itching   Patient tolerates specific NDC generic product - family is able to bring supply in from home.    Demerol [meperidine] Anxiety, Other (See Comments)   Makes pt hyper, the higher the dose the more hyper pt gets   Stadol [butorphanol] Anxiety, Other (See Comments)   Hyper- the higher the dose the more hyper pt gets        Medication List        Accurate as of September 11, 2024  8:43 AM. If you have any questions, ask your nurse or doctor.          acetaminophen  500 MG tablet Commonly known as: TYLENOL  Take  2 tablets (1,000 mg total) by mouth every 6 (six) hours as needed (pain).   amLODipine  5 MG tablet Commonly known as: NORVASC  Take 1 tablet (5 mg total) by mouth 2 (two) times daily.   apixaban  5 MG Tabs tablet Commonly known as: ELIQUIS  Take 1 tablet (5 mg total) by mouth 2 (two) times daily.   cholecalciferol 25 MCG (1000 UNIT) tablet Commonly known as: VITAMIN D3 Take 2 tablets (2,000 Units total) by mouth daily.   cyclobenzaprine  10 MG tablet Commonly known as: FLEXERIL  SMARTSIG:1 Tablet(s) By Mouth Every 12 Hours PRN   HYDROcodone-acetaminophen  10-325 MG tablet Commonly known as: NORCO Take 1 tablet by mouth 4 (four) times daily as needed.    metoprolol  succinate 25 MG 24 hr tablet Commonly known as: TOPROL -XL TAKE ONE TABLET BY MOUTH ONCE DAILY   promethazine  25 MG tablet Commonly known as: PHENERGAN  TAKE ONE TABLET BY MOUTH EVERY 6 HOURS AS NEEDED FOR NAUSEA AND VOMITING   tamsulosin  0.4 MG Caps capsule Commonly known as: Flomax  Take 1 capsule (0.4 mg total) by mouth at bedtime. Takes 2 capsules for kidney stone   traMADol  50 MG tablet Commonly known as: ULTRAM  Take 50 mg by mouth as needed.        Allergies:  Allergies  Allergen Reactions   Tramadol  Hcl Itching    Patient tolerates specific NDC generic product - family is able to bring supply in from home.    Demerol [Meperidine] Anxiety and Other (See Comments)    Makes pt hyper, the higher the dose the more hyper pt gets   Stadol [Butorphanol] Anxiety and Other (See Comments)    Hyper- the higher the dose the more hyper pt gets    Family History: Family History  Problem Relation Age of Onset   Depression Mother    Hyperlipidemia Mother    Cancer Father    Kidney disease Father    Breast cancer Maternal Grandmother    Heart disease Maternal Grandfather     Social History:  reports that he has never smoked. He has been exposed to tobacco smoke. He has never used smokeless tobacco. He reports current alcohol use. He reports that he does not use drugs.  ROS: All other review of systems were reviewed and are negative except what is noted above in HPI  Physical Exam: BP 138/75   Pulse 64   Constitutional:  Alert and oriented, No acute distress. HEENT:  AT, moist mucus membranes.  Trachea midline, no masses. Cardiovascular: No clubbing, cyanosis, or edema. Respiratory: Normal respiratory effort, no increased work of breathing. GI: Abdomen is soft, nontender, nondistended, no abdominal masses GU: No CVA tenderness.  Lymph: No cervical or inguinal lymphadenopathy. Skin: No rashes, bruises or suspicious lesions. Neurologic: Grossly intact, no focal  deficits, moving all 4 extremities. Psychiatric: Normal mood and affect.  Laboratory Data: Lab Results  Component Value Date   WBC 9.1 04/20/2024   HGB 15.3 04/20/2024   HCT 43.9 04/20/2024   MCV 84.7 04/20/2024   PLT 154 04/20/2024    Lab Results  Component Value Date   CREATININE 1.12 08/18/2021    No results found for: PSA  No results found for: TESTOSTERONE  Lab Results  Component Value Date   HGBA1C 5.4 12/19/2018    Urinalysis    Component Value Date/Time   COLORURINE YELLOW 12/19/2018 1202   APPEARANCEUR Clear 09/06/2023 0832   LABSPEC 1.017 12/19/2018 1202   PHURINE 6.0 12/19/2018 1202   GLUCOSEU Negative 09/06/2023 9167  HGBUR NEGATIVE 12/19/2018 1202   BILIRUBINUR Negative 09/06/2023 0832   KETONESUR NEGATIVE 12/19/2018 1202   PROTEINUR Negative 09/06/2023 0832   PROTEINUR NEGATIVE 12/19/2018 1202   NITRITE Negative 09/06/2023 0832   NITRITE NEGATIVE 12/19/2018 1202   LEUKOCYTESUR Trace (A) 09/06/2023 0832    Lab Results  Component Value Date   LABMICR See below: 09/06/2023   WBCUA 0-5 09/06/2023   LABEPIT 0-10 09/06/2023   BACTERIA None seen 09/06/2023    Pertinent Imaging: Renal US  08/10/2024: Images reviewed and discussed with the patient  Results for orders placed during the hospital encounter of 08/07/16  DG Abd 1 View  Narrative CLINICAL DATA:  Kidney stones.  EXAM: ABDOMEN - 1 VIEW   COMPARISON:  07/03/2016.  06/18/2016.  03/15/2016 .  FINDINGS: Soft tissue structures are unremarkable. Small left renal stones noted. Pelvic calcifications are again noted and unchanged in appearance. These are most likely phleboliths. Again distal ureteral calculus, particular the left ureteric cannot be excluded.  IMPRESSION: 1. Stable left nephrolithiasis.  2. Stable pelvic calcifications, most likely phleboliths. Distal ureteral stones, particular in the distal left ureter again cannot be excluded.   Electronically Signed By:  Debby  Register On: 08/08/2016 08:23  No results found for this or any previous visit.  No results found for this or any previous visit.  No results found for this or any previous visit.  Results for orders placed during the hospital encounter of 08/10/24  Ultrasound renal complete  Narrative CLINICAL DATA:  Nephrolithiasis.  EXAM: RENAL / URINARY TRACT ULTRASOUND COMPLETE  COMPARISON:  08/27/2023.  FINDINGS: Right Kidney:  Renal measurements: 10.2 x 5.1 x 5.6 cm = volume: 155 mL. Normal parenchymal echogenicity. No solid mass or hydronephrosis. 4 mm shadowing echogenic stone.  Left Kidney:  Renal measurements: 12.1 x 6.2 x 5.0 cm = volume: 198 mL. Echogenicity within normal limits. No mass or hydronephrosis visualized.  Bladder:  Appears normal for degree of bladder distention.  Other:  None.  IMPRESSION: 1. Nonobstructing 4 mm right renal stone. 2. Otherwise negative.   Electronically Signed By: Newell Eke M.D. On: 08/21/2024 11:14  No results found for this or any previous visit.  No results found for this or any previous visit.  Results for orders placed during the hospital encounter of 03/05/22  CT RENAL STONE STUDY  Narrative CLINICAL DATA:  Right flank pain for 5 weeks. History of nephrolithiasis.  EXAM: CT ABDOMEN AND PELVIS WITHOUT CONTRAST  TECHNIQUE: Multidetector CT imaging of the abdomen and pelvis was performed following the standard protocol without IV contrast.  RADIATION DOSE REDUCTION: This exam was performed according to the departmental dose-optimization program which includes automated exposure control, adjustment of the mA and/or kV according to patient size and/or use of iterative reconstruction technique.  COMPARISON:  12/01/2015 CT abdomen/pelvis. 05/29/2017 CT angiogram of the chest, abdomen and pelvis.  FINDINGS: Lower chest: No significant pulmonary nodules or acute consolidative airspace disease. Intact  visualized lower sternotomy wire. Small amount of fluid in the lower thoracic esophagus.  Hepatobiliary: Mild diffuse hepatic steatosis. Normal liver size. No definite liver surface irregularity. No liver masses. Normal gallbladder with no radiopaque cholelithiasis. No biliary ductal dilatation.  Pancreas: Normal, with no mass or duct dilation.  Spleen: Normal size. No mass.  Adrenals/Urinary Tract: Normal adrenals. Nonobstructing 2 mm lower right renal stone. Nonobstructing 2 mm interpolar and 1 mm lower left renal stones. No hydronephrosis. Normal caliber ureters. No ureteral stones. No contour deforming renal masses. Normal bladder.  Stomach/Bowel: Probable small hiatal hernia. Otherwise normal nondistended stomach. Normal caliber small bowel with no small bowel wall thickening. Normal appendix. Marked sigmoid diverticulosis. Mild wall thickening with associated mild pericolonic fat stranding in the mid sigmoid colon (series 2/image 60), compatible with mild acute sigmoid diverticulitis. No pericolonic free air or abscess.  Vascular/Lymphatic: Atherosclerotic nonaneurysmal abdominal aorta. No pathologically enlarged lymph nodes in the abdomen or pelvis.  Reproductive: Moderate prostatomegaly. Nonspecific heterogeneous internal prostatic calcifications.  Other: No pneumoperitoneum, ascites or focal fluid collection. Small fat containing right inguinal hernia.  Musculoskeletal: No aggressive appearing focal osseous lesions. Mild thoracolumbar spondylosis.  IMPRESSION: 1. Mild acute sigmoid diverticulitis. No free air or abscess. 2. Tiny nonobstructing bilateral nephrolithiasis. No hydronephrosis. No ureteral or bladder stones. 3. Moderate prostatomegaly. 4. Mild diffuse hepatic steatosis. 5. Probable small hiatal hernia. Small amount of fluid in the lower thoracic esophagus, suggesting esophageal dysmotility and/or gastroesophageal reflux. 6. Small fat containing right  inguinal hernia. 7. Aortic Atherosclerosis (ICD10-I70.0).  These results will be called to the ordering clinician or representative by the Radiology Department at the imaging location.   Electronically Signed By: Selinda DELENA Blue M.D. On: 03/05/2022 12:44   Assessment & Plan:    1. Kidney stones (Primary) -followup 1 year with renal US  - Urinalysis, Routine w reflex microscopic  2. Benign prostatic hyperplasia with urinary obstruction -continue flomax  0.4mg  daily  3. Nocturia Continue flomax  0.4mg  daily.    No follow-ups on file.  Belvie Clara, MD  [facility] [place]

## 2024-09-11 NOTE — Patient Instructions (Signed)

## 2024-09-15 ENCOUNTER — Encounter: Payer: Self-pay | Admitting: *Deleted

## 2024-09-15 ENCOUNTER — Ambulatory Visit: Admitting: General Surgery

## 2024-09-15 ENCOUNTER — Encounter: Payer: Self-pay | Admitting: General Surgery

## 2024-09-15 ENCOUNTER — Telehealth: Payer: Self-pay | Admitting: *Deleted

## 2024-09-15 VITALS — BP 140/79 | HR 58 | Temp 98.2°F | Resp 14 | Ht 66.0 in | Wt 184.0 lb

## 2024-09-15 DIAGNOSIS — K4090 Unilateral inguinal hernia, without obstruction or gangrene, not specified as recurrent: Secondary | ICD-10-CM

## 2024-09-15 NOTE — Telephone Encounter (Signed)
 Pharmacy, can you please comment on how long Eliquis  can me held for upcoming procedure?  Thank you!

## 2024-09-15 NOTE — Telephone Encounter (Signed)
   Pre-operative Risk Assessment    Patient Name: Cameron [name]  DOB: [birth year] MRN: [id number]   Date of last office visit: 04/20/24 DR. HOCHREIN Date of next office visit: NONE   Request for Surgical Clearance    Procedure:  XI ROBOTIC ASSISTED LAPAROSCOPIC INGUINAL HERNIA REPAIR WITH MESH  Date of Surgery:  Clearance 11/02/24                                Surgeon:  DR. MARK JENKINS Surgeon's Group or Practice Name:  [facility] Phone number:  [phone] Fax number:  [phone]   Type of Clearance Requested:   - Medical  - Pharmacy:  Hold Apixaban  (Eliquis ) x 3 DAYS PRIOR; DOES PT NEED LOVENOX ?    Type of Anesthesia:  General    Additional requests/questions:    [name]   09/15/2024, 4:10 PM

## 2024-09-16 NOTE — Progress Notes (Signed)
 Cameron [name]; [id number]; February 05, 1956   HPI Patient is a 68 year old white male who was referred to my care by Dr. Bobbette for evaluation and treatment of a right inguinal hernia.  Patient states he started having pain and swelling in the right groin region 1 month ago.  It is made worse with straining.  It comes and goes.  It is starting to affect his daily lifestyle.  He is on Eliquis  for history of a CVA, status post aortic valve replacement and aortic aneurysm repair. Past Medical History:  Diagnosis Date   Allergy Unsure   In Med List   Aortic stenosis, severe    Arthritis    Ascending aortic aneurysm    GERD (gastroesophageal reflux disease)    Headache    Heart murmur    History of heart murmur in childhood    History of kidney stones    Hypertension    Hyperthyroidism    Impaired glucose tolerance    Nephrolithiasis    Sleep apnea    On CPAP   Stroke [facility])     Past Surgical History:  Procedure Laterality Date   BENTALL PROCEDURE N/A 12/22/2018   Procedure: BENTALL PROCEDURE;  Surgeon: Lucas Dorise POUR, MD;  [facility];  Service: Open Heart Surgery;  Laterality: N/A;  NEEDS BILATERAL RADIAL ARTERIAL LINES   CARDIAC CATHETERIZATION  1990s   CARDIAC VALVE REPLACEMENT  12/22/2018   CYSTOSCOPY  1990s   With stone extraction   CYSTOSCOPY W/ URETERAL STENT PLACEMENT Left 12/02/2015   Procedure: CYSTOSCOPY WITH RETROGRADE PYELOGRAM/URETERAL STENT PLACEMENT;  Surgeon: Belvie LITTIE Clara, MD;  Location: [facility];  Service: Urology;  Laterality: Left;   ESOPHAGOGASTRODUODENOSCOPY  1990s   HERNIA REPAIR  @[facility]   1990s?   KNEE ARTHROSCOPY Left 2005   LITHOTRIPSY  1990s   LITHOTRIPSY Left 11/2015   REPLACEMENT ASCENDING AORTA N/A 12/22/2018   Procedure: REPLACEMENT ASCENDING AORTA;  Surgeon: Lucas Dorise POUR, MD;  [facility];  Service: Open Heart Surgery;  Laterality: N/A;   RIGHT/LEFT HEART CATH AND CORONARY ANGIOGRAPHY N/A 12/09/2018   Procedure: RIGHT/LEFT  HEART CATH AND CORONARY ANGIOGRAPHY;  Surgeon: Dann Candyce RAMAN, MD;  Location: [facility] INVASIVE CV LAB;  Service: Cardiovascular;  Laterality: N/A;   TEE WITHOUT CARDIOVERSION N/A 12/22/2018   Procedure: TRANSESOPHAGEAL ECHOCARDIOGRAM (TEE);  Surgeon: Lucas Dorise POUR, MD;  Location: [facility] OR;  Service: Open Heart Surgery;  Laterality: N/A;   ULTRASOUND GUIDANCE FOR VASCULAR ACCESS  12/09/2018   Procedure: Ultrasound Guidance For Vascular Access;  Surgeon: Dann Candyce RAMAN, MD;  Location: [facility] INVASIVE CV LAB;  Service: Cardiovascular;;   UMBILICAL HERNIA REPAIR  late 1990s    Family History  Problem Relation Age of Onset   Depression Mother    Hyperlipidemia Mother    Cancer Father    Kidney disease Father    Breast cancer Maternal Grandmother    Heart disease Maternal Grandfather     Current Outpatient Medications on File Prior to Visit  Medication Sig Dispense Refill   acetaminophen  (TYLENOL ) 500 MG tablet Take 2 tablets (1,000 mg total) by mouth every 6 (six) hours as needed (pain). 30 tablet 3   amLODipine  (NORVASC ) 5 MG tablet Take 1 tablet (5 mg total) by mouth 2 (two) times daily. 180 tablet 3   apixaban  (ELIQUIS ) 5 MG TABS tablet Take 1 tablet (5 mg total) by mouth 2 (two) times daily. 180 tablet 3   cholecalciferol (VITAMIN D3) 25 MCG (1000 UNIT) tablet Take  2 tablets (2,000 Units total) by mouth daily. 180 tablet 3   cyclobenzaprine  (FLEXERIL ) 10 MG tablet SMARTSIG:1 Tablet(s) By Mouth Every 12 Hours PRN 30 tablet 3   HYDROcodone-acetaminophen  (NORCO) 10-325 MG tablet Take 1 tablet by mouth 4 (four) times daily as needed.     metoprolol  succinate (TOPROL -XL) 25 MG 24 hr tablet TAKE ONE TABLET BY MOUTH ONCE DAILY 90 tablet 3   promethazine  (PHENERGAN ) 25 MG tablet TAKE ONE TABLET BY MOUTH EVERY 6 HOURS AS NEEDED FOR NAUSEA AND VOMITING 30 tablet 3   tamsulosin  (FLOMAX ) 0.4 MG CAPS capsule Take 1 capsule (0.4 mg total) by mouth at bedtime. Takes 2 capsules for kidney stone 90  capsule 3   No current facility-administered medications on file prior to visit.    Allergies  Allergen Reactions   Tramadol  Hcl Itching    Patient tolerates specific NDC generic product - family is able to bring supply in from home.    Demerol [Meperidine] Anxiety and Other (See Comments)    Makes pt hyper, the higher the dose the more hyper pt gets   Stadol [Butorphanol] Anxiety and Other (See Comments)    Hyper- the higher the dose the more hyper pt gets    Social History   Substance and Sexual Activity  Alcohol Use Yes   Comment: Rarely    Social History   Tobacco Use  Smoking Status Never   Passive exposure: Yes  Smokeless Tobacco Never    Review of Systems  Constitutional: Negative.   HENT: Negative.    Eyes: Negative.   Respiratory: Negative.    Cardiovascular: Negative.   Gastrointestinal:  Positive for abdominal pain.  Genitourinary: Negative.   Musculoskeletal: Negative.   Skin: Negative.   Neurological: Negative.   Endo/Heme/Allergies:  Bruises/bleeds easily.  Psychiatric/Behavioral: Negative.      Objective   Vitals:   09/15/24 1101  BP: (!) 140/79  Pulse: (!) 58  Resp: 14  Temp: 98.2 F (36.8 C)  SpO2: 95%    Physical Exam Vitals reviewed.  Constitutional:      Appearance: Normal appearance. He is normal weight. He is not ill-appearing.  HENT:     Head: Normocephalic and atraumatic.  Cardiovascular:     Rate and Rhythm: Normal rate and regular rhythm.     Heart sounds: Murmur heard.     No friction rub. No gallop.  Pulmonary:     Effort: Pulmonary effort is normal. No respiratory distress.     Breath sounds: Normal breath sounds. No stridor. No wheezing, rhonchi or rales.  Abdominal:     General: Bowel sounds are normal. There is no distension.     Palpations: Abdomen is soft. There is no mass.     Tenderness: There is no abdominal tenderness. There is no guarding or rebound.     Hernia: A hernia is present.     Comments: Easily  reducible right inguinal hernia.  No left inguinal hernia.  Genitourinary:    Testes: Normal.  Skin:    General: Skin is warm and dry.  Neurological:     Mental Status: He is alert and oriented to person, place, and time.   Primary care notes reviewed  Assessment  Right inguinal hernia History of Eliquis  use secondary to history of CVA, aortic valve replacement Plan  I told the patient that we will need to get cardiology clearance as to whether he needs to be bridged with Lovenox .  Surgery is temporarily scheduled for 11/02/2024.  He  is scheduled for robotic assisted laparoscopic right inguinal herniorrhaphy with mesh.  The risks and benefits of the procedure including bleeding, infection, mesh use, and the possibility of recurrence of the hernia were fully explained to the patient, who gave informed consent.

## 2024-09-16 NOTE — Telephone Encounter (Signed)
 Please address if Eliquis  will require bridging with lovenox  as patient has mechanical valve and hx of CVA.

## 2024-09-17 ENCOUNTER — Ambulatory Visit: Admitting: General Surgery

## 2024-09-17 NOTE — Addendum Note (Signed)
 Addended by: SAUNDRA TAWNI DEL on: 09/17/2024 02:34 PM   Modules accepted: Orders

## 2024-09-29 ENCOUNTER — Telehealth (HOSPITAL_BASED_OUTPATIENT_CLINIC_OR_DEPARTMENT_OTHER): Payer: Self-pay | Admitting: *Deleted

## 2024-09-29 NOTE — Telephone Encounter (Signed)
 I called to s/w the pt about tele preop appt. Pt's wife answered (DPR). Pt's wife scheduled tele preop appt for the pt 10/19/24. Med rec and consent are done.    Patient Consent for Virtual Visit        Cameron [name] has provided verbal consent on 09/29/2024 for a virtual visit (video or telephone).   CONSENT FOR VIRTUAL VISIT FOR:  Cameron [name]  By participating in this virtual visit I agree to the following:  I hereby voluntarily request, consent and authorize [facility] and its employed or contracted physicians, physician assistants, nurse practitioners or other licensed health care professionals (the Practitioner), to provide me with telemedicine health care services (the "Services) as deemed necessary by the treating Practitioner. I acknowledge and consent to receive the Services by the Practitioner via telemedicine. I understand that the telemedicine visit will involve communicating with the Practitioner through live audiovisual communication technology and the disclosure of certain medical information by electronic transmission. I acknowledge that I have been given the opportunity to request an in-person assessment or other available alternative prior to the telemedicine visit and am voluntarily participating in the telemedicine visit.  I understand that I have the right to withhold or withdraw my consent to the use of telemedicine in the course of my care at any time, without affecting my right to future care or treatment, and that the Practitioner or I may terminate the telemedicine visit at any time. I understand that I have the right to inspect all information obtained and/or recorded in the course of the telemedicine visit and may receive copies of available information for a reasonable fee.  I understand that some of the potential risks of receiving the Services via telemedicine include:  Delay or interruption in medical evaluation due to technological equipment failure  or disruption; Information transmitted may not be sufficient (e.g. poor resolution of images) to allow for appropriate medical decision making by the Practitioner; and/or  In rare instances, security protocols could fail, causing a breach of personal health information.  Furthermore, I acknowledge that it is my responsibility to provide information about my medical history, conditions and care that is complete and accurate to the best of my ability. I acknowledge that Practitioner's advice, recommendations, and/or decision may be based on factors not within their control, such as incomplete or inaccurate data provided by me or distortions of diagnostic images or specimens that may result from electronic transmissions. I understand that the practice of medicine is not an exact science and that Practitioner makes no warranties or guarantees regarding treatment outcomes. I acknowledge that a copy of this consent can be made available to me via my patient portal [facility] MyChart), or I can request a printed copy by calling the office of [facility].    I understand that my insurance will be billed for this visit.   I have read or had this consent read to me. I understand the contents of this consent, which adequately explains the benefits and risks of the Services being provided via telemedicine.  I have been provided ample opportunity to ask questions regarding this consent and the Services and have had my questions answered to my satisfaction. I give my informed consent for the services to be provided through the use of telemedicine in my medical care

## 2024-09-29 NOTE — Telephone Encounter (Signed)
 Patient with diagnosis of CVA on Eliquis  for anticoagulation.    Procedure:  XI ROBOTIC ASSISTED LAPAROSCOPIC INGUINAL HERNIA REPAIR WITH MESH   Date of Surgery:  Clearance 11/02/24      CrCl 77 Platelet count 154  Chart indicates history of TIA in May 2022, followed by CVA in July 2022  Per office protocol, patient can hold Eliquis  for 3 days prior to procedure.   Patient will not need bridging with Lovenox  (enoxaparin ) around procedure.  **This guidance is not considered finalized until pre-operative APP has relayed final recommendations.**

## 2024-09-29 NOTE — Telephone Encounter (Signed)
 I called to s/w the pt about tele preop appt. Pt's wife answered (DPR). Pt's wife scheduled tele preop appt for the pt 10/19/24. Med rec and consent are done.

## 2024-09-29 NOTE — Telephone Encounter (Signed)
   Name: Cameron [name]  DOB: 1956-10-24  MRN: [id number]  Primary Cardiologist: Lynwood Schilling, MD   Preoperative team, please contact this patient and set up a phone call appointment for further preoperative risk assessment. Please obtain consent and complete medication review. Thank you for your help.  I confirm that guidance regarding antiplatelet and oral anticoagulation therapy has been completed and, if necessary, noted below.  Per Dr. Mavis (requesting surgeon) patient will only need to hold Eliquis  2 days prior. Dr. Schilling is okay with patient having a 2 days hold without Lovenox  bridge. Waiting final pharm D recommendations.   I also confirmed the patient resides in the state of [place] . As per [facility] Medical Board telemedicine laws, the patient must reside in the state in which the provider is licensed.    Barnie Hila, NP 09/29/2024, 7:19 AM [facility]

## 2024-10-12 NOTE — H&P (Signed)
 Cameron [name]; [id number]; Jan 07, 1956   HPI Patient is a 68 year old white male who was referred to my care by Dr. Bobbette for evaluation and treatment of a right inguinal hernia.  Patient states he started having pain and swelling in the right groin region 1 month ago.  It is made worse with straining.  It comes and goes.  It is starting to affect his daily lifestyle.  He is on Eliquis  for history of a CVA, status post aortic valve replacement and aortic aneurysm repair. Past Medical History:  Diagnosis Date   Allergy Unsure   In Med List   Aortic stenosis, severe    Arthritis    Ascending aortic aneurysm    GERD (gastroesophageal reflux disease)    Headache    Heart murmur    History of heart murmur in childhood    History of kidney stones    Hypertension    Hyperthyroidism    Impaired glucose tolerance    Nephrolithiasis    Sleep apnea    On CPAP   Stroke [facility])     Past Surgical History:  Procedure Laterality Date   BENTALL PROCEDURE N/A 12/22/2018   Procedure: BENTALL PROCEDURE;  Surgeon: Lucas Dorise POUR, MD;  Location: [facility] OR;  Service: Open Heart Surgery;  Laterality: N/A;  NEEDS BILATERAL RADIAL ARTERIAL LINES   CARDIAC CATHETERIZATION  1990s   CARDIAC VALVE REPLACEMENT  12/22/2018   CYSTOSCOPY  1990s   With stone extraction   CYSTOSCOPY W/ URETERAL STENT PLACEMENT Left 12/02/2015   Procedure: CYSTOSCOPY WITH RETROGRADE PYELOGRAM/URETERAL STENT PLACEMENT;  Surgeon: Belvie LITTIE Clara, MD;  Location: [facility] ORS;  Service: Urology;  Laterality: Left;   ESOPHAGOGASTRODUODENOSCOPY  1990s   HERNIA REPAIR  [facility]   1990s?   KNEE ARTHROSCOPY Left 2005   LITHOTRIPSY  1990s   LITHOTRIPSY Left 11/2015   REPLACEMENT ASCENDING AORTA N/A 12/22/2018   Procedure: REPLACEMENT ASCENDING AORTA;  Surgeon: Lucas Dorise POUR, MD;  Location: [facility] OR;  Service: Open Heart Surgery;  Laterality: N/A;   RIGHT/LEFT HEART CATH AND CORONARY ANGIOGRAPHY N/A 12/09/2018   Procedure: RIGHT/LEFT  HEART CATH AND CORONARY ANGIOGRAPHY;  Surgeon: Dann Candyce RAMAN, MD;  Location: [facility] INVASIVE CV LAB;  Service: Cardiovascular;  Laterality: N/A;   TEE WITHOUT CARDIOVERSION N/A 12/22/2018   Procedure: TRANSESOPHAGEAL ECHOCARDIOGRAM (TEE);  Surgeon: Lucas Dorise POUR, MD;  Location: [facility] OR;  Service: Open Heart Surgery;  Laterality: N/A;   ULTRASOUND GUIDANCE FOR VASCULAR ACCESS  12/09/2018   Procedure: Ultrasound Guidance For Vascular Access;  Surgeon: Dann Candyce RAMAN, MD;  Location: [facility] INVASIVE CV LAB;  Service: Cardiovascular;;   UMBILICAL HERNIA REPAIR  late 1990s    Family History  Problem Relation Age of Onset   Depression Mother    Hyperlipidemia Mother    Cancer Father    Kidney disease Father    Breast cancer Maternal Grandmother    Heart disease Maternal Grandfather     Current Outpatient Medications on File Prior to Visit  Medication Sig Dispense Refill   acetaminophen  (TYLENOL ) 500 MG tablet Take 2 tablets (1,000 mg total) by mouth every 6 (six) hours as needed (pain). 30 tablet 3   amLODipine  (NORVASC ) 5 MG tablet Take 1 tablet (5 mg total) by mouth 2 (two) times daily. 180 tablet 3   apixaban  (ELIQUIS ) 5 MG TABS tablet Take 1 tablet (5 mg total) by mouth 2 (two) times daily. 180 tablet 3   cholecalciferol (VITAMIN D3) 25 MCG (1000 UNIT) tablet Take  2 tablets (2,000 Units total) by mouth daily. 180 tablet 3   cyclobenzaprine  (FLEXERIL ) 10 MG tablet SMARTSIG:1 Tablet(s) By Mouth Every 12 Hours PRN 30 tablet 3   HYDROcodone-acetaminophen  (NORCO) 10-325 MG tablet Take 1 tablet by mouth 4 (four) times daily as needed.     metoprolol  succinate (TOPROL -XL) 25 MG 24 hr tablet TAKE ONE TABLET BY MOUTH ONCE DAILY 90 tablet 3   promethazine  (PHENERGAN ) 25 MG tablet TAKE ONE TABLET BY MOUTH EVERY 6 HOURS AS NEEDED FOR NAUSEA AND VOMITING 30 tablet 3   tamsulosin  (FLOMAX ) 0.4 MG CAPS capsule Take 1 capsule (0.4 mg total) by mouth at bedtime. Takes 2 capsules for kidney stone 90  capsule 3   No current facility-administered medications on file prior to visit.    Allergies  Allergen Reactions   Tramadol  Hcl Itching    Patient tolerates specific NDC generic product - family is able to bring supply in from home.    Demerol [Meperidine] Anxiety and Other (See Comments)    Makes pt hyper, the higher the dose the more hyper pt gets   Stadol [Butorphanol] Anxiety and Other (See Comments)    Hyper- the higher the dose the more hyper pt gets    Social History   Substance and Sexual Activity  Alcohol Use Yes   Comment: Rarely    Social History   Tobacco Use  Smoking Status Never   Passive exposure: Yes  Smokeless Tobacco Never    Review of Systems  Constitutional: Negative.   HENT: Negative.    Eyes: Negative.   Respiratory: Negative.    Cardiovascular: Negative.   Gastrointestinal:  Positive for abdominal pain.  Genitourinary: Negative.   Musculoskeletal: Negative.   Skin: Negative.   Neurological: Negative.   Endo/Heme/Allergies:  Bruises/bleeds easily.  Psychiatric/Behavioral: Negative.      Objective   Vitals:   09/15/24 1101  BP: (!) 140/79  Pulse: (!) 58  Resp: 14  Temp: 98.2 F (36.8 C)  SpO2: 95%    Physical Exam Vitals reviewed.  Constitutional:      Appearance: Normal appearance. He is normal weight. He is not ill-appearing.  HENT:     Head: Normocephalic and atraumatic.  Cardiovascular:     Rate and Rhythm: Normal rate and regular rhythm.     Heart sounds: Murmur heard.     No friction rub. No gallop.  Pulmonary:     Effort: Pulmonary effort is normal. No respiratory distress.     Breath sounds: Normal breath sounds. No stridor. No wheezing, rhonchi or rales.  Abdominal:     General: Bowel sounds are normal. There is no distension.     Palpations: Abdomen is soft. There is no mass.     Tenderness: There is no abdominal tenderness. There is no guarding or rebound.     Hernia: A hernia is present.     Comments: Easily  reducible right inguinal hernia.  No left inguinal hernia.  Genitourinary:    Testes: Normal.  Skin:    General: Skin is warm and dry.  Neurological:     Mental Status: He is alert and oriented to person, place, and time.   Primary care notes reviewed  Assessment  Right inguinal hernia History of Eliquis  use secondary to history of CVA, aortic valve replacement Plan  I told the patient that we will need to get cardiology clearance as to whether he needs to be bridged with Lovenox .  Surgery is temporarily scheduled for 11/02/2024.  He  is scheduled for robotic assisted laparoscopic right inguinal herniorrhaphy with mesh.  The risks and benefits of the procedure including bleeding, infection, mesh use, and the possibility of recurrence of the hernia were fully explained to the patient, who gave informed consent. Addendum: After discussion with cardiology, will stop Eliquis  2 days prior to procedure.  No bridging required.

## 2024-10-19 ENCOUNTER — Ambulatory Visit: Attending: Cardiology | Admitting: Emergency Medicine

## 2024-10-19 DIAGNOSIS — Z01810 Encounter for preprocedural cardiovascular examination: Secondary | ICD-10-CM

## 2024-10-19 NOTE — Progress Notes (Signed)
 Virtual Visit via Telephone Note   Because of Cameron [name] co-morbid illnesses, he is at least at moderate risk for complications without adequate follow up.  This format is felt to be most appropriate for this patient at this time.  Due to technical limitations with video connection web designer), today's appointment will be conducted as an audio only telehealth visit, and Cameron [name] verbally agreed to proceed in this manner.   All issues noted in this document were discussed and addressed.  No physical exam could be performed with this format.  Evaluation Performed:  Preoperative cardiovascular risk assessment _____________   Date:  10/19/2024   Patient ID:  Cameron [name], DOB 1956-12-04, MRN [id number] Patient Location:  Home Provider location:   Office  Primary Care Provider:  Bobbette Coye LABOR, MD Primary Cardiologist:  Lynwood Schilling, MD  Chief Complaint / Patient Profile   68 y.o. y/o male with a h/o aortic stenosis s/p Bentall and bioprosthetic aortic valve replacement, aortic aneurysm repair, hypertension, elevated coronary calcium, history of CVA who is pending robotic assisted laparoscopic inguinal hernia repair with mesh on 11/02/2024 by [facility] and presents today for telephonic preoperative cardiovascular risk assessment.  History of Present Illness    Cameron [name] is a 68 y.o. male who presents via audio/video conferencing for a telehealth visit today.  Pt was last seen in cardiology clinic on 04/20/2024 by Dr. Schilling.  At that time Cameron [name] was doing well and was to follow-up in 1 year.  The patient is now pending procedure as outlined above. Since his last visit, he denies chest pain, shortness of breath, lower extremity edema, fatigue, palpitations, melena, hematuria, hemoptysis, diaphoresis, weakness, presyncope, syncope, orthopnea, and PND.  Today patient is doing well without acute cardiovascular concerns.  He denies any  chest pains or shortness of breath.  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  Overall no symptoms to suggest active angina.  He is able to complete greater than 4 METS.  Past Medical History    Past Medical History:  Diagnosis Date   Allergy Unsure   In Med List   Aortic stenosis, severe    Arthritis    Ascending aortic aneurysm    GERD (gastroesophageal reflux disease)    Headache    Heart murmur    History of heart murmur in childhood    History of kidney stones    Hypertension    Hyperthyroidism    Impaired glucose tolerance    Nephrolithiasis    Sleep apnea    On CPAP   Stroke [facility])    Past Surgical History:  Procedure Laterality Date   BENTALL PROCEDURE N/A 12/22/2018   Procedure: BENTALL PROCEDURE;  Surgeon: Lucas Dorise POUR, MD;  Location: [facility] OR;  Service: Open Heart Surgery;  Laterality: N/A;  NEEDS BILATERAL RADIAL ARTERIAL LINES   CARDIAC CATHETERIZATION  1990s   CARDIAC VALVE REPLACEMENT  12/22/2018   CYSTOSCOPY  1990s   With stone extraction   CYSTOSCOPY W/ URETERAL STENT PLACEMENT Left 12/02/2015   Procedure: CYSTOSCOPY WITH RETROGRADE PYELOGRAM/URETERAL STENT PLACEMENT;  Surgeon: Belvie LITTIE Clara, MD;  Location: [facility] ORS;  Service: Urology;  Laterality: Left;   ESOPHAGOGASTRODUODENOSCOPY  1990s   HERNIA REPAIR  @[facility]   1990s?   KNEE ARTHROSCOPY Left 2005   LITHOTRIPSY  1990s   LITHOTRIPSY Left 11/2015   REPLACEMENT ASCENDING AORTA N/A  12/22/2018   Procedure: REPLACEMENT ASCENDING AORTA;  Surgeon: Lucas Dorise POUR, MD;  Location: [facility] OR;  Service: Open Heart Surgery;  Laterality: N/A;   RIGHT/LEFT HEART CATH AND CORONARY ANGIOGRAPHY N/A 12/09/2018   Procedure: RIGHT/LEFT HEART CATH AND CORONARY ANGIOGRAPHY;  Surgeon: Dann Candyce RAMAN, MD;  Location: [facility] INVASIVE CV LAB;  Service: Cardiovascular;  Laterality: N/A;   TEE WITHOUT CARDIOVERSION N/A 12/22/2018   Procedure:  TRANSESOPHAGEAL ECHOCARDIOGRAM (TEE);  Surgeon: Lucas Dorise POUR, MD;  Location: [facility] OR;  Service: Open Heart Surgery;  Laterality: N/A;   ULTRASOUND GUIDANCE FOR VASCULAR ACCESS  12/09/2018   Procedure: Ultrasound Guidance For Vascular Access;  Surgeon: Dann Candyce RAMAN, MD;  Location: [facility] INVASIVE CV LAB;  Service: Cardiovascular;;   UMBILICAL HERNIA REPAIR  late 1990s    Allergies  Allergies  Allergen Reactions   Tramadol  Hcl Itching    Patient tolerates specific NDC generic product - family is able to bring supply in from home.    Demerol [Meperidine] Anxiety and Other (See Comments)    Makes pt hyper, the higher the dose the more hyper pt gets   Stadol [Butorphanol] Anxiety and Other (See Comments)    Hyper- the higher the dose the more hyper pt gets    Home Medications    Prior to Admission medications   Medication Sig Start Date End Date Taking? Authorizing Provider  acetaminophen  (TYLENOL ) 500 MG tablet Take 2 tablets (1,000 mg total) by mouth every 6 (six) hours as needed (pain). 04/20/24   Lavona Agent, MD  amLODipine  (NORVASC ) 5 MG tablet Take 1 tablet (5 mg total) by mouth 2 (two) times daily. 04/20/24   Lavona Agent, MD  apixaban  (ELIQUIS ) 5 MG TABS tablet Take 1 tablet (5 mg total) by mouth 2 (two) times daily. 04/20/24   Lavona Agent, MD  cholecalciferol (VITAMIN D3) 25 MCG (1000 UNIT) tablet Take 2 tablets (2,000 Units total) by mouth daily. 04/20/24   Lavona Agent, MD  cyclobenzaprine  (FLEXERIL ) 10 MG tablet SMARTSIG:1 Tablet(s) By Mouth Every 12 Hours PRN 04/20/24   Lavona Agent, MD  HYDROcodone-acetaminophen  [facility]) 10-325 MG tablet Take 1 tablet by mouth 4 (four) times daily as needed. 05/02/21   [provider]  metoprolol  succinate (TOPROL -XL) 25 MG 24 hr tablet TAKE ONE TABLET BY MOUTH ONCE DAILY 08/06/24   Lavona Agent, MD  promethazine  (PHENERGAN ) 25 MG tablet TAKE ONE TABLET BY MOUTH EVERY 6 HOURS AS NEEDED FOR NAUSEA AND VOMITING 09/10/24    Lavona Agent, MD  tamsulosin  (FLOMAX ) 0.4 MG CAPS capsule Take 1 capsule (0.4 mg total) by mouth at bedtime. Takes 2 capsules for kidney stone 09/11/24   McKenzie, Belvie CROME, MD    Physical Exam    Vital Signs:  Cameron [name] does not have vital signs available for review today.  Given telephonic nature of communication, physical exam is limited. AAOx3. NAD. Normal affect.  Speech and respirations are unlabored.  Accessory Clinical Findings    None  Assessment & Plan    1.  Preoperative Cardiovascular Risk Assessment: According to the Revised Cardiac Risk Index (RCRI), his Perioperative Risk of Major Cardiac Event is (%): 0.4. His Functional Capacity in METs is: 5.62 according to the Duke Activity Status Index (DASI).Therefore, based on ACC/AHA guidelines, patient would be at acceptable risk for the planned procedure without further cardiovascular testing. I will route this recommendation to the requesting party via Epic fax function.  The patient was advised that if he develops new symptoms prior to  surgery to contact our office to arrange for a follow-up visit, and he verbalized understanding.  Per Dr. Mavis (requesting surgeon) patient will only need to hold Eliquis  2 days prior. Dr. Lavona (primary cardiologist) is okay with patient having a 2 day hold without Lovenox  bridge.   Per office protocol, patient can hold Eliquis  for 2 days prior to procedure.   Patient will not need bridging with Lovenox  (enoxaparin ) around procedure.  A copy of this note will be routed to requesting surgeon.  Time:   Today, I have spent 10 minutes with the patient with telehealth technology discussing medical history, symptoms, and management plan.     Lum LITTIE Louis, NP  10/19/2024, 2:51 PM

## 2024-10-27 ENCOUNTER — Encounter: Payer: Self-pay | Admitting: Urology

## 2024-10-27 DIAGNOSIS — R972 Elevated prostate specific antigen [PSA]: Secondary | ICD-10-CM

## 2024-10-27 NOTE — Patient Instructions (Signed)
 Cameron [name]  10/27/2024     @PREFPERIOPPHARMACY @   Your procedure is scheduled on Monday, 11/10.  Report to [facility] at 0600 A.M.  Call this number if you have problems the morning of surgery:  [phone]  If you experience any cold or flu symptoms such as cough, fever, chills, shortness of breath, etc. between now and your scheduled surgery, please notify us  at the above number.   Remember:  Do not eat or drink after midnight.  You may drink clear liquids until 0330 .  Clear liquids allowed are:                    Water, Juice (No red color; non-citric and without pulp; diabetics please choose diet or no sugar options), Carbonated beverages (diabetics please choose diet or no sugar options), Clear Tea (No creamer, milk, or cream, including half & half and powdered creamer), and Black Coffee Only (No creamer, milk or cream, including half & half and powdered creamer)    Take these medicines the morning of surgery with A SIP OF WATER amlodipine , metoprolol , flomax , norco and phenergan  if needed    Do not wear jewelry, make-up or nail polish, including gel polish,  artificial nails, or any other type of covering on natural nails (fingers and  toes).  Do not wear lotions, powders, or perfumes, or deodorant.  Do not shave 48 hours prior to surgery.  Men may shave face and neck.  Do not bring valuables to the hospital.  [facility] is not responsible for any belongings or valuables.  Contacts, dentures or bridgework may not be worn into surgery.  Leave your suitcase in the car.  After surgery it may be brought to your room.  For patients admitted to the hospital, discharge time will be determined by your treatment team.  Patients discharged the day of surgery will not be allowed to drive home.   Name and phone number of your driver:   family  Please read over the following fact sheets that you were given. Pain Booklet, Surgical Site Infection Prevention, Anesthesia Post-op  Instructions, and Care and Recovery After Surgery      Laparoscopic Surgery for Groin Hernia in Adults: What to Know After After a laparoscopic surgery for groin hernia, it's common to have pain, discomfort, soreness, swelling, and bruising around the cuts that were made in the belly. There may also be swelling of the scrotum in males. Follow these instructions at home: Activity Rest as told. Get up and take short walks many times during the day. This helps you breathe better and keeps your blood flowing. Ask for help if you feel weak or unsteady. Ask if it's OK for you to lift. Do not take baths, swim, or use a hot tub until you're told it's OK. Ask if you can shower. Ask what things are safe for you to do at home. Ask when you can go back to work or school. Medicines Take your medicines only as told. You may need to take steps to help treat or prevent trouble pooping (constipation), such as: Taking medicine to help you poop. Eating foods high in fiber, like beans, whole grains, and fresh fruits and vegetables. Drinking more fluids as told. Ask your health care provider if it's safe to drive or use machines while taking your medicine. Wound care  Take care of the cuts in your belly as told. Make sure you: Wash your hands with soap and water for  at least 20 seconds before and after you change your bandage. If you can't use soap and water, use hand sanitizer. Change your bandage. Leave stitches or skin glue alone. Leave tape strips alone unless you're told to take them off. You may trim the edges of the tape strips if they curl up. Check the cuts on your belly every day for signs of infection. Check for: More redness, swelling, or pain. More fluid or blood. Warmth. Pus or a bad smell. Pain management  Use ice or an ice pack as told. Place a towel between your skin and the ice. Leave the ice on for 20 minutes, 2-3 times a day. If your skin turns red, take off the ice right away to  prevent skin damage. The risk of damage is higher if you can't feel pain, heat, or cold. General instructions Do not smoke, vape, or use nicotine or tobacco. Doing this can slow healing. Wear compression stockings to reduce swelling and help prevent blood clots in your legs. You may be asked to continue to do deep breathing exercises at home. This will help to prevent a lung infection. Your provider may give you more instructions. Make sure you know what you can and can't do. Contact a health care provider if: You have any signs of infection. You have more swelling or pain in your scrotum. You have pain that gets worse or doesn't get better with medicine. You aren't able to pee. You haven't pooped in 3 days. You have a fever. You throw up or you feel like throwing up. Get help right away if: You have redness, warmth, or pain in your leg. You have chest pain. You have trouble breathing. You have very bad pain in your belly. You throw up each time you eat or drink. These symptoms may be an emergency. Call 911 right away. Do not wait to see if the symptoms will go away. Do not drive yourself to the hospital. This information is not intended to replace advice given to you by your health care provider. Make sure you discuss any questions you have with your health care provider. Document Revised: 09/24/2023 Document Reviewed: 09/24/2023 Elsevier Patient Education  2025 [employer].

## 2024-10-28 ENCOUNTER — Encounter (HOSPITAL_COMMUNITY): Payer: Self-pay

## 2024-10-28 ENCOUNTER — Encounter (HOSPITAL_COMMUNITY)
Admission: RE | Admit: 2024-10-28 | Discharge: 2024-10-28 | Disposition: A | Discharge status: Home / Self Care | Source: Ambulatory Visit | Attending: General Surgery | Admitting: General Surgery

## 2024-10-28 DIAGNOSIS — Z01812 Encounter for preprocedural laboratory examination: Secondary | ICD-10-CM | POA: Diagnosis present

## 2024-10-28 DIAGNOSIS — Z01818 Encounter for other preprocedural examination: Secondary | ICD-10-CM

## 2024-10-28 LAB — CBC WITH DIFFERENTIAL/PLATELET
Abs Immature Granulocytes: 0.01 K/uL (ref 0.00–0.07)
Basophils Absolute: 0.1 K/uL (ref 0.0–0.1)
Basophils Relative: 1 %
Eosinophils Absolute: 0.2 K/uL (ref 0.0–0.5)
Eosinophils Relative: 3 %
HCT: 44.4 % (ref 39.0–52.0)
Hemoglobin: 14.7 g/dL (ref 13.0–17.0)
Immature Granulocytes: 0 %
Lymphocytes Relative: 28 %
Lymphs Abs: 1.9 K/uL (ref 0.7–4.0)
MCH: 28.4 pg (ref 26.0–34.0)
MCHC: 33.1 g/dL (ref 30.0–36.0)
MCV: 85.7 fL (ref 80.0–100.0)
Monocytes Absolute: 1 K/uL (ref 0.1–1.0)
Monocytes Relative: 14 %
Neutro Abs: 3.6 K/uL (ref 1.7–7.7)
Neutrophils Relative %: 54 %
Platelets: 204 K/uL (ref 150–400)
RBC: 5.18 MIL/uL (ref 4.22–5.81)
RDW: 13.5 % (ref 11.5–15.5)
WBC: 6.8 K/uL (ref 4.0–10.5)
nRBC: 0 % (ref 0.0–0.2)

## 2024-10-28 LAB — BASIC METABOLIC PANEL WITH GFR
Anion gap: 10 (ref 5–15)
BUN: 13 mg/dL (ref 8–23)
CO2: 25 mmol/L (ref 22–32)
Calcium: 9 mg/dL (ref 8.9–10.3)
Chloride: 103 mmol/L (ref 98–111)
Creatinine, Ser: 0.95 mg/dL (ref 0.61–1.24)
GFR, Estimated: >60 mL/min
Glucose, Bld: 104 mg/dL — ABNORMAL HIGH (ref 70–99)
Potassium: 4.5 mmol/L (ref 3.5–5.1)
Sodium: 138 mmol/L (ref 135–145)

## 2024-10-29 ENCOUNTER — Other Ambulatory Visit (HOSPITAL_COMMUNITY)

## 2024-11-02 ENCOUNTER — Ambulatory Visit (HOSPITAL_COMMUNITY): Payer: Self-pay | Admitting: Anesthesiology

## 2024-11-02 ENCOUNTER — Encounter (HOSPITAL_COMMUNITY)
Admission: RE | Disposition: A | Discharge status: Home / Self Care | Payer: Self-pay | Source: Home / Self Care | Attending: General Surgery

## 2024-11-02 ENCOUNTER — Ambulatory Visit (HOSPITAL_COMMUNITY)
Admission: RE | Admit: 2024-11-02 | Discharge: 2024-11-02 | Disposition: A | Discharge status: Home / Self Care | Attending: General Surgery | Admitting: General Surgery

## 2024-11-02 ENCOUNTER — Encounter (HOSPITAL_COMMUNITY): Payer: Self-pay | Admitting: General Surgery

## 2024-11-02 DIAGNOSIS — I2510 Atherosclerotic heart disease of native coronary artery without angina pectoris: Secondary | ICD-10-CM | POA: Diagnosis not present

## 2024-11-02 DIAGNOSIS — I10 Essential (primary) hypertension: Secondary | ICD-10-CM | POA: Diagnosis not present

## 2024-11-02 DIAGNOSIS — D176 Benign lipomatous neoplasm of spermatic cord: Secondary | ICD-10-CM | POA: Diagnosis not present

## 2024-11-02 DIAGNOSIS — K219 Gastro-esophageal reflux disease without esophagitis: Secondary | ICD-10-CM | POA: Insufficient documentation

## 2024-11-02 DIAGNOSIS — Z01818 Encounter for other preprocedural examination: Secondary | ICD-10-CM

## 2024-11-02 DIAGNOSIS — G4730 Sleep apnea, unspecified: Secondary | ICD-10-CM

## 2024-11-02 DIAGNOSIS — Z8249 Family history of ischemic heart disease and other diseases of the circulatory system: Secondary | ICD-10-CM | POA: Insufficient documentation

## 2024-11-02 DIAGNOSIS — Z8673 Personal history of transient ischemic attack (TIA), and cerebral infarction without residual deficits: Secondary | ICD-10-CM | POA: Diagnosis not present

## 2024-11-02 DIAGNOSIS — Z7901 Long term (current) use of anticoagulants: Secondary | ICD-10-CM | POA: Diagnosis not present

## 2024-11-02 DIAGNOSIS — Z952 Presence of prosthetic heart valve: Secondary | ICD-10-CM | POA: Diagnosis not present

## 2024-11-02 DIAGNOSIS — K4090 Unilateral inguinal hernia, without obstruction or gangrene, not specified as recurrent: Secondary | ICD-10-CM | POA: Insufficient documentation

## 2024-11-02 HISTORY — PX: XI ROBOTIC ASSISTED INGUINAL HERNIA REPAIR WITH MESH: SHX6706

## 2024-11-02 SURGERY — REPAIR, HERNIA, INGUINAL, ROBOT-ASSISTED, LAPAROSCOPIC, USING MESH
Anesthesia: General | Site: Inguinal | Laterality: Right

## 2024-11-02 MED ORDER — ORAL CARE MOUTH RINSE: 15.0000 mL | Freq: Once | OROMUCOSAL | Status: AC

## 2024-11-02 MED ORDER — STERILE WATER FOR IRRIGATION IR SOLN
Status: DC | PRN
Start: 2024-11-02 — End: 2024-11-02
  Administered 2024-11-02: 500 mL

## 2024-11-02 MED ORDER — ACETAMINOPHEN 500 MG PO TABS
1000.0000 mg | ORAL_TABLET | Freq: Once | ORAL | Status: AC
  Administered 2024-11-02: 1000 mg via ORAL
  Filled 2024-11-02: qty 2

## 2024-11-02 MED ORDER — CHLORHEXIDINE GLUCONATE CLOTH 2 % EX PADS: 6.0000 | MEDICATED_PAD | Freq: Once | CUTANEOUS | Status: DC

## 2024-11-02 MED ORDER — ORAL CARE MOUTH RINSE: 15.0000 mL | Freq: Once | OROMUCOSAL | Status: DC

## 2024-11-02 MED ORDER — ONDANSETRON HCL 4 MG/2ML IJ SOLN
INTRAMUSCULAR | Status: DC | PRN
Start: 2024-11-02 — End: 2024-11-02
  Administered 2024-11-02: 4 mg via INTRAVENOUS

## 2024-11-02 MED ORDER — OXYCODONE HCL 5 MG/5ML PO SOLN: 5.0000 mg | Freq: Once | ORAL | Status: DC | PRN

## 2024-11-02 MED ORDER — SODIUM CHLORIDE 0.9 % IV SOLN: 12.5000 mg | INTRAVENOUS | Status: DC | PRN

## 2024-11-02 MED ORDER — FENTANYL CITRATE (PF) 50 MCG/ML IJ SOSY
25.0000 ug | PREFILLED_SYRINGE | INTRAMUSCULAR | Status: DC | PRN
  Administered 2024-11-02 (×2): 50 ug via INTRAVENOUS
  Filled 2024-11-02 (×2): qty 1

## 2024-11-02 MED ORDER — PHENYLEPHRINE 80 MCG/ML (10ML) SYRINGE FOR IV PUSH (FOR BLOOD PRESSURE SUPPORT)
PREFILLED_SYRINGE | INTRAVENOUS | Status: AC
  Filled 2024-11-02: qty 10

## 2024-11-02 MED ORDER — HYDROMORPHONE HCL 1 MG/ML IJ SOLN
INTRAMUSCULAR | Status: AC
  Filled 2024-11-02: qty 0.5

## 2024-11-02 MED ORDER — SUGAMMADEX SODIUM 200 MG/2ML IV SOLN
INTRAVENOUS | Status: DC | PRN
  Administered 2024-11-02: 400 mg via INTRAVENOUS

## 2024-11-02 MED ORDER — CHLORHEXIDINE GLUCONATE CLOTH 2 % EX PADS
6.0000 | MEDICATED_PAD | Freq: Once | CUTANEOUS | Status: DC
  Administered 2024-11-02: 6 via TOPICAL

## 2024-11-02 MED ORDER — LACTATED RINGERS IV SOLN: INTRAVENOUS | Status: DC

## 2024-11-02 MED ORDER — ONDANSETRON HCL 4 MG/2ML IJ SOLN
INTRAMUSCULAR | Status: AC
  Filled 2024-11-02: qty 2

## 2024-11-02 MED ORDER — PROPOFOL 500 MG/50ML IV EMUL
INTRAVENOUS | Status: DC | PRN
  Administered 2024-11-02: 160 mg via INTRAVENOUS

## 2024-11-02 MED ORDER — BUPIVACAINE HCL (PF) 0.5 % IJ SOLN
INTRAMUSCULAR | Status: AC
  Filled 2024-11-02: qty 30

## 2024-11-02 MED ORDER — MIDAZOLAM HCL (PF) 2 MG/2ML IJ SOLN
INTRAMUSCULAR | Status: DC | PRN
  Administered 2024-11-02: 2 mg via INTRAVENOUS

## 2024-11-02 MED ORDER — MIDAZOLAM HCL 2 MG/2ML IJ SOLN
INTRAMUSCULAR | Status: AC
  Filled 2024-11-02: qty 2

## 2024-11-02 MED ORDER — LIDOCAINE 2% (20 MG/ML) 5 ML SYRINGE
INTRAMUSCULAR | Status: DC | PRN
  Administered 2024-11-02: 80 mg via INTRAVENOUS

## 2024-11-02 MED ORDER — LIDOCAINE 2% (20 MG/ML) 5 ML SYRINGE
INTRAMUSCULAR | Status: AC
  Filled 2024-11-02: qty 5

## 2024-11-02 MED ORDER — PROPOFOL 500 MG/50ML IV EMUL
INTRAVENOUS | Status: AC
  Filled 2024-11-02: qty 100

## 2024-11-02 MED ORDER — DEXAMETHASONE SOD PHOSPHATE PF 10 MG/ML IJ SOLN
INTRAMUSCULAR | Status: DC | PRN
  Administered 2024-11-02: 10 mg via INTRAVENOUS

## 2024-11-02 MED ORDER — ONDANSETRON HCL 4 MG/2ML IJ SOLN: 4.0000 mg | Freq: Once | INTRAMUSCULAR | Status: DC | PRN

## 2024-11-02 MED ORDER — OXYCODONE HCL 5 MG PO TABS
5.0000 mg | ORAL_TABLET | Freq: Once | ORAL | Status: AC | PRN
  Administered 2024-11-02: 5 mg
  Filled 2024-11-02: qty 1

## 2024-11-02 MED ORDER — ROCURONIUM BROMIDE 10 MG/ML (PF) SYRINGE
PREFILLED_SYRINGE | INTRAVENOUS | Status: AC
  Filled 2024-11-02: qty 10

## 2024-11-02 MED ORDER — ACETAMINOPHEN 160 MG/5ML PO SOLN
960.0000 mg | Freq: Once | ORAL | Status: AC
  Filled 2024-11-02: qty 30

## 2024-11-02 MED ORDER — CEFAZOLIN SODIUM-DEXTROSE 2-4 GM/100ML-% IV SOLN
2.0000 g | INTRAVENOUS | Status: AC
  Administered 2024-11-02: 2 g via INTRAVENOUS
  Filled 2024-11-02: qty 100

## 2024-11-02 MED ORDER — CHLORHEXIDINE GLUCONATE 0.12 % MT SOLN
15.0000 mL | Freq: Once | OROMUCOSAL | Status: AC
  Administered 2024-11-02: 15 mL via OROMUCOSAL

## 2024-11-02 MED ORDER — FENTANYL CITRATE (PF) 250 MCG/5ML IJ SOLN
INTRAMUSCULAR | Status: DC | PRN
  Administered 2024-11-02 (×2): 50 ug via INTRAVENOUS
  Administered 2024-11-02: 100 ug via INTRAVENOUS
  Administered 2024-11-02: 50 ug via INTRAVENOUS

## 2024-11-02 MED ORDER — CHLORHEXIDINE GLUCONATE 0.12 % MT SOLN: 15.0000 mL | Freq: Once | OROMUCOSAL | Status: DC

## 2024-11-02 MED ORDER — OXYCODONE HCL 5 MG PO TABS: 5.0000 mg | ORAL_TABLET | Freq: Once | ORAL | Status: DC | PRN

## 2024-11-02 MED ORDER — HYDROMORPHONE HCL 1 MG/ML IJ SOLN
0.5000 mg | INTRAMUSCULAR | Status: DC | PRN
  Administered 2024-11-02 (×3): 0.5 mg via INTRAVENOUS
  Filled 2024-11-02 (×2): qty 0.5

## 2024-11-02 MED ORDER — FENTANYL CITRATE (PF) 250 MCG/5ML IJ SOLN
INTRAMUSCULAR | Status: AC
  Filled 2024-11-02: qty 5

## 2024-11-02 MED ORDER — ROCURONIUM BROMIDE 10 MG/ML (PF) SYRINGE
PREFILLED_SYRINGE | INTRAVENOUS | Status: DC | PRN
  Administered 2024-11-02: 80 mg via INTRAVENOUS
  Administered 2024-11-02: 20 mg via INTRAVENOUS

## 2024-11-02 MED ORDER — OXYCODONE HCL 5 MG PO TABS: 5.0000 mg | ORAL_TABLET | Freq: Four times a day (QID) | ORAL | 0 refills | Status: AC | PRN

## 2024-11-02 MED ORDER — SEVOFLURANE IN SOLN
RESPIRATORY_TRACT | Status: AC
  Filled 2024-11-02: qty 250

## 2024-11-02 MED ORDER — ONDANSETRON HCL 4 MG/2ML IJ SOLN
INTRAMUSCULAR | Status: AC
  Filled 2024-11-02: qty 4

## 2024-11-02 MED ORDER — BUPIVACAINE HCL (PF) 0.5 % IJ SOLN
INTRAMUSCULAR | Status: DC | PRN
Start: 2024-11-02 — End: 2024-11-02
  Administered 2024-11-02: 30 mL

## 2024-11-02 MED ORDER — HYDROMORPHONE HCL 1 MG/ML IJ SOLN: 0.2500 mg | INTRAMUSCULAR | Status: DC | PRN

## 2024-11-02 MED ORDER — OXYCODONE HCL 5 MG/5ML PO SOLN: 5.0000 mg | Freq: Once | ORAL | Status: AC | PRN

## 2024-11-02 MED ORDER — KETOROLAC TROMETHAMINE 30 MG/ML IJ SOLN
30.0000 mg | Freq: Once | INTRAMUSCULAR | Status: AC
  Administered 2024-11-02: 30 mg via INTRAVENOUS
  Filled 2024-11-02: qty 1

## 2024-11-02 SURGICAL SUPPLY — 43 items
CHLORAPREP W/TINT 26 (MISCELLANEOUS) ×1 IMPLANT
COVER LIGHT HANDLE (MISCELLANEOUS) IMPLANT
COVER MAYO STAND XLG (MISCELLANEOUS) ×1 IMPLANT
COVER TIP SHEARS 8 DVNC (MISCELLANEOUS) ×1 IMPLANT
DERMABOND ADVANCED .7 DNX12 (GAUZE/BANDAGES/DRESSINGS) ×1 IMPLANT
DRAPE ARM DVNC X/XI (DISPOSABLE) ×3 IMPLANT
DRAPE COLUMN DVNC XI (DISPOSABLE) ×1 IMPLANT
DRIVER NDL MEGA SUTCUT DVNCXI (INSTRUMENTS) ×1 IMPLANT
DRIVER NDLE MEGA SUTCUT DVNCXI (INSTRUMENTS) ×1 IMPLANT
ELECTRODE REM PT RTRN 9FT ADLT (ELECTROSURGICAL) ×1 IMPLANT
FORCEPS BPLR R/ABLATION 8 DVNC (INSTRUMENTS) ×1 IMPLANT
GAUZE SPONGE 4X4 12PLY STRL (GAUZE/BANDAGES/DRESSINGS) ×1 IMPLANT
GLOVE BIOGEL PI IND STRL 7.0 (GLOVE) ×4 IMPLANT
GLOVE ECLIPSE 6.5 STRL STRAW (GLOVE) IMPLANT
GLOVE SURG SS PI 7.5 STRL IVOR (GLOVE) ×2 IMPLANT
GOWN STRL REUS W/TWL LRG LVL3 (GOWN DISPOSABLE) ×2 IMPLANT
IRRIGATOR SUCT 8 DISP DVNC XI (IRRIGATION / IRRIGATOR) IMPLANT
KIT PINK PAD W/HEAD ARM REST (MISCELLANEOUS) ×1 IMPLANT
KIT TURNOVER KIT A (KITS) ×1 IMPLANT
MANIFOLD NEPTUNE II (INSTRUMENTS) ×1 IMPLANT
MESH 3DMAX MID 5X7 RT XLRG (Mesh General) IMPLANT
NDL HYPO 21X1.5 SAFETY (NEEDLE) ×1 IMPLANT
NDL INSUFFLATION 14GA 120MM (NEEDLE) ×1 IMPLANT
NEEDLE HYPO 21X1.5 SAFETY (NEEDLE) ×1 IMPLANT
NEEDLE INSUFFLATION 14GA 120MM (NEEDLE) ×1 IMPLANT
OBTURATOR OPTICALSTD 8 DVNC (TROCAR) ×1 IMPLANT
PACK LAP CHOLE LZT030E (CUSTOM PROCEDURE TRAY) ×1 IMPLANT
PENCIL HANDSWITCHING (ELECTRODE) ×1 IMPLANT
POSITIONER HEAD 8X9X4 ADT (SOFTGOODS) ×1 IMPLANT
SCISSORS MNPLR CVD DVNC XI (INSTRUMENTS) ×1 IMPLANT
SEAL UNIV 5-12 XI (MISCELLANEOUS) ×3 IMPLANT
SET BASIN LINEN APH (SET/KITS/TRAYS/PACK) ×1 IMPLANT
SET TUBE SMOKE EVAC HIGH FLOW (TUBING) ×1 IMPLANT
SOL PREP POV-IOD 4OZ 10% (MISCELLANEOUS) ×1 IMPLANT
SUT MNCRL AB 4-0 PS2 18 (SUTURE) ×2 IMPLANT
SUT STRATA 3-0 SH (SUTURE) ×1 IMPLANT
SUT VIC AB 2-0 SH 27X BRD (SUTURE) ×1 IMPLANT
SUT VICRYL 0 UR6 27IN ABS (SUTURE) IMPLANT
SYR 30ML LL (SYRINGE) ×1 IMPLANT
TAPE TRANSPORE STRL 2 31045 (GAUZE/BANDAGES/DRESSINGS) ×1 IMPLANT
TRAY FOL W/BAG SLVR 16FR STRL (SET/KITS/TRAYS/PACK) ×1 IMPLANT
TUBE CONNECTING 12X1/4 (SUCTIONS) ×1 IMPLANT
WATER STERILE IRR 500ML POUR (IV SOLUTION) ×1 IMPLANT

## 2024-11-02 NOTE — Anesthesia Preprocedure Evaluation (Signed)
 Anesthesia Evaluation  Patient identified by MRN, date of birth, ID band Patient awake    Reviewed: Allergy & Precautions, H&P , NPO status , Patient's Chart, lab work & pertinent test results, reviewed documented beta blocker date and time   Airway Mallampati: II  TM Distance: >3 FB Neck ROM: full    Dental no notable dental hx.    Pulmonary sleep apnea    Pulmonary exam normal breath sounds clear to auscultation       Cardiovascular Exercise Tolerance: Good hypertension, + CAD  + Valvular Problems/Murmurs  Rhythm:regular Rate:Normal     Neuro/Psych  Headaches CVA  negative psych ROS   GI/Hepatic Neg liver ROS,GERD  ,,  Endo/Other   Hyperthyroidism   Renal/GU negative Renal ROS  negative genitourinary   Musculoskeletal   Abdominal   Peds  Hematology negative hematology ROS (+)   Anesthesia Other Findings   Reproductive/Obstetrics negative OB ROS                              Anesthesia Physical Anesthesia Plan  ASA: 3  Anesthesia Plan: General and General ETT   Post-op Pain Management:    Induction:   PONV Risk Score and Plan: Ondansetron   Airway Management Planned:   Additional Equipment:   Intra-op Plan:   Post-operative Plan:   Informed Consent: I have reviewed the patients History and Physical, chart, labs and discussed the procedure including the risks, benefits and alternatives for the proposed anesthesia with the patient or authorized representative who has indicated his/her understanding and acceptance.     Dental Advisory Given  Plan Discussed with: CRNA  Anesthesia Plan Comments:         Anesthesia Quick Evaluation

## 2024-11-02 NOTE — Interval H&P Note (Signed)
 History and Physical Interval Note:  11/02/2024 7:06 AM  Cameron [name]  has presented today for surgery, with the diagnosis of INGUINAL HERNIA, RIGHT.  The various methods of treatment have been discussed with the patient and family. After consideration of risks, benefits and other options for treatment, the patient has consented to  Procedure(s): REPAIR, HERNIA, INGUINAL, ROBOT-ASSISTED, LAPAROSCOPIC, USING MESH (Right) as a surgical intervention.  The patient's history has been reviewed, patient examined, no change in status, stable for surgery.  I have reviewed the patient's chart and labs.  Questions were answered to the patient's satisfaction.     [name]

## 2024-11-02 NOTE — Op Note (Signed)
 Patient:  Cameron [name]  DOB:  29-Jul-1956  MRN:  [id number]   Preop Diagnosis: Right inguinal hernia  Postop Diagnosis: Same  Procedure: Robotic assisted laparoscopic right inguinal herniorrhaphy with mesh  Surgeon: [name], MD  Anes: General Endotracheal  Indications: Patient is a 68 year old white male who presents with a symptomatic right inguinal hernia.  The risks and benefits of the procedure including bleeding, infection, bowel injury, mesh use, and the possibility of recurrence of the hernia were fully explained to the patient, who gave informed consent.  Procedure note: The patient was placed in the supine position.  After induction of general endotracheal anesthesia, the perineum and abdomen were prepped and draped using the usual sterile technique with Betadine and ChloraPrep.  Surgical site confirmation was performed.  An incision was made to the left upper quadrant at Palmer's point.  A Veress needle was introduced into the abdominal cavity and confirmation of placement was done using the saline drop test.  The abdomen was then insufflated to 15 mmHg pressure.  An 8 mm trocar was placed in the left upper quadrant under direct visualization without difficulty.  Additional 8 mm trocars were placed in the upper midline and right upper quadrant regions.  The patient was placed in Trendelenburg position.  The robot was then docked and targeted.  The patient had an indirect right inguinal hernia.  The patient had a small left inguinal hernia, but he was preoperatively asymptomatic and it was not easily palpable on physical examination.  It was elected to proceed with just a right inguinal herniorrhaphy.  A peritoneal flap was then performed lateral to medial down to the Cooper's ligament.  The dissection was taken down greater than 2 cm posterior to Cooper's ligament.  This was then taken out laterally to expose the internal ring.  The patient had a large lipoma of the cord along  with an indirect hernia sac.  This was freed away from the spermatic cord and approximately 7 cm dissection posterior to the internal ring was created.  Care was taken to avoid the vas deferens and testicular vessels.  An extra-large Bard 3D max mesh was then inserted and secured to Cooper's ligament using a 2-0 Vicryl interrupted suture.  Another fixation stitch was placed loosely anterior and superior to the internal ring.  The peritoneal flap was then closed using a 3-0 Stratafix running suture.  Air was evacuated from the preperitoneal space and good approximation of the mesh was noted to the abdominal wall with resolution of the hernia.  The robot was undocked and all air was evacuated from the abdominal cavity prior to the removal of the trocars.  All wounds were irrigated with normal saline.  All wounds were injected with 0.5% Sensorcaine.  The right inguinal region was injected with 0.5% Sensorcaine.  The upper midline fascia was reapproximated using an 0 Vicryl interrupted suture.  All skin incisions were closed using a 4-0 Monocryl subcuticular suture.  Dermabond was applied.  All tape and needle counts were correct at the end of the procedure.  The patient was extubated in the operating room and transferred to PACU in stable condition.  Complications: None  EBL: Minimal  Specimen: None

## 2024-11-02 NOTE — Transfer of Care (Signed)
 Immediate Anesthesia Transfer of Care Note  Patient: Cameron [name]  Procedure(s) Performed: REPAIR, HERNIA, INGUINAL, ROBOT-ASSISTED, LAPAROSCOPIC, USING MESH (Right: Inguinal)  Patient Location: PACU  Anesthesia Type:General  Level of Consciousness: awake, alert , oriented, and patient cooperative  Airway & Oxygen Therapy: Patient Spontanous Breathing and Patient connected to nasal cannula oxygen  Post-op Assessment: Report given to RN, Post -op Vital signs reviewed and stable, and Patient moving all extremities X 4  Post vital signs: Reviewed and stable  Last Vitals:  Vitals Value Taken Time  BP 134/80 11/02/24 10:05  Temp 97.6 1005  Pulse 69 11/02/24 10:08  Resp 17 11/02/24 10:08  SpO2 98 % 11/02/24 10:08  Vitals shown include unfiled device data.  Last Pain:  Vitals:   11/02/24 0651  PainSc: 0-No pain         Complications: No notable events documented.

## 2024-11-02 NOTE — Discharge Instructions (Signed)
Restart Eliquis tomorrow.

## 2024-11-02 NOTE — Anesthesia Procedure Notes (Signed)
 Procedure Name: Intubation Date/Time: 11/02/2024 7:44 AM  Performed by: Cordella Elvie HERO, CRNAPre-anesthesia Checklist: Patient identified, Emergency Drugs available, Suction available, Patient being monitored and Timeout performed Patient Re-evaluated:Patient Re-evaluated prior to induction Oxygen Delivery Method: Circle system utilized Preoxygenation: Pre-oxygenation with 100% oxygen Induction Type: IV induction Ventilation: Two handed mask ventilation required Laryngoscope Size: Mac and 3 Grade View: Grade III Tube type: Oral Tube size: 7.5 mm Number of attempts: 2 Airway Equipment and Method: Stylet Placement Confirmation: ETT inserted through vocal cords under direct vision, positive ETCO2, CO2 detector and breath sounds checked- equal and bilateral Secured at: 23 cm Tube secured with: Tape Dental Injury: Teeth and Oropharynx as per pre-operative assessment  Difficulty Due To: Difficulty was anticipated, Difficult Airway- due to large tongue and Difficult Airway- due to limited oral opening Comments: DL x 1 by SRNA, cords visualized but difficulty passing tube, DL x 1 CRNA, able to intubate, gr 3 view. OG tube placed to decompress air from abdomen. VSS

## 2024-11-03 ENCOUNTER — Encounter (HOSPITAL_COMMUNITY): Payer: Self-pay | Admitting: General Surgery

## 2024-11-03 NOTE — Anesthesia Postprocedure Evaluation (Signed)
 Anesthesia Post Note  Patient: Cameron [name]  Procedure(s) Performed: REPAIR, HERNIA, INGUINAL, ROBOT-ASSISTED, LAPAROSCOPIC, USING MESH (Right: Inguinal)  Patient location during evaluation: Phase II Anesthesia Type: General Level of consciousness: awake Pain management: pain level controlled Vital Signs Assessment: post-procedure vital signs reviewed and stable Respiratory status: spontaneous breathing and respiratory function stable Cardiovascular status: blood pressure returned to baseline and stable Postop Assessment: no headache and no apparent nausea or vomiting Anesthetic complications: no Comments: Late entry   No notable events documented.   Last Vitals:  Vitals:   11/02/24 1142 11/02/24 1203  BP: 127/70 (!) 157/97  Pulse: 80 75  Resp:  12  Temp: 36.4 C 36.6 C  SpO2: 98% 96%    Last Pain:  Vitals:   11/02/24 1203  TempSrc: Oral  PainSc: 5                  Yvonna JINNY Bosworth

## 2024-11-10 ENCOUNTER — Ambulatory Visit: Admitting: General Surgery

## 2024-11-10 ENCOUNTER — Encounter: Payer: Self-pay | Admitting: General Surgery

## 2024-11-10 VITALS — BP 147/84 | HR 76 | Temp 98.0°F | Resp 14 | Ht 66.0 in | Wt 181.0 lb

## 2024-11-10 DIAGNOSIS — Z09 Encounter for follow-up examination after completed treatment for conditions other than malignant neoplasm: Secondary | ICD-10-CM

## 2024-11-10 NOTE — Progress Notes (Signed)
 Subjective:     Cameron [name]  Patient here for postoperative visit, status post robotic assisted laparoscopic right inguinal herniorrhaphy with mesh.  Patient states he is doing well.  He did have some bruising of the scrotum, but this is resolving.  He does take Eliquis . Objective:    BP (!) 147/84   Pulse 76   Temp 98 F (36.7 C) (Oral)   Resp 14   Ht 5' 6 (1.676 m)   Wt 181 lb (82.1 kg)   SpO2 97%   BMI 29.21 kg/m   General:  alert, cooperative, and no distress  Abdomen is soft, incisions healing well.  Some thickening of the right spermatic cord is noted, but no hematoma or seroma is present.  No evidence of recurrence of the hernia.     Assessment:    Doing well postoperatively.    Plan:   Patient should avoid heavy lifting over 20 pounds for the next few weeks.  I told him the spermatic cord swelling should decrease with time.  Will see him in follow-up on 12/03/2024.

## 2024-12-03 ENCOUNTER — Ambulatory Visit (INDEPENDENT_AMBULATORY_CARE_PROVIDER_SITE_OTHER): Admitting: General Surgery

## 2024-12-03 ENCOUNTER — Telehealth: Payer: Self-pay | Admitting: Family Medicine

## 2024-12-03 ENCOUNTER — Encounter: Payer: Self-pay | Admitting: General Surgery

## 2024-12-03 ENCOUNTER — Other Ambulatory Visit: Payer: Self-pay | Admitting: Urology

## 2024-12-03 VITALS — BP 146/86 | HR 70 | Temp 97.7°F | Resp 14 | Ht 66.0 in | Wt 185.0 lb

## 2024-12-03 DIAGNOSIS — Z09 Encounter for follow-up examination after completed treatment for conditions other than malignant neoplasm: Secondary | ICD-10-CM

## 2024-12-03 NOTE — Progress Notes (Signed)
 Subjective:     Cameron [name]  Patient here for follow-up, status post robotic assisted laparoscopic right inguinal herniorrhaphy with mesh.  He is doing very well.  He has no complaints. Objective:    BP (!) 146/86   Pulse 70   Temp 97.7 F (36.5 C) (Oral)   Resp 14   Ht 5' 6 (1.676 m)   Wt 185 lb (83.9 kg)   SpO2 97%   BMI 29.86 kg/m   General:  alert, cooperative, and no distress  Abdomen soft, incisions well-healed.  No seroma or hernia present in right groin.     Assessment:    Doing well postoperatively.    Plan:   May increase activity as able.  Follow-up here as needed.

## 2024-12-03 NOTE — Telephone Encounter (Signed)
 FMLA paperwork completed and faxed to HR Dept at [employer] at [phone]. Confirmation received.  Patient out of work starting 11/02/2024 and may return unrestricted on 12/21/2024.

## 2024-12-09 LAB — ISOPSA
ISOPSA: 17.8 {index} — ABNORMAL HIGH
TOTAL PSA: 7.7 ng/mL — ABNORMAL HIGH (ref <4.0)

## 2024-12-10 ENCOUNTER — Ambulatory Visit: Payer: Self-pay

## 2024-12-10 DIAGNOSIS — R972 Elevated prostate specific antigen [PSA]: Secondary | ICD-10-CM

## 2024-12-14 ENCOUNTER — Encounter: Payer: Self-pay | Admitting: General Surgery

## 2024-12-14 ENCOUNTER — Encounter: Payer: Self-pay | Admitting: *Deleted

## 2024-12-28 ENCOUNTER — Ambulatory Visit (HOSPITAL_COMMUNITY)
Admission: RE | Admit: 2024-12-28 | Discharge: 2024-12-28 | Disposition: A | Discharge status: Home / Self Care | Source: Ambulatory Visit | Attending: Urology | Admitting: Urology

## 2024-12-28 DIAGNOSIS — R972 Elevated prostate specific antigen [PSA]: Secondary | ICD-10-CM | POA: Insufficient documentation

## 2024-12-28 MED ORDER — GADOBUTROL 1 MMOL/ML IV SOLN
10.0000 mL | Freq: Once | INTRAVENOUS | Status: AC | PRN
  Administered 2024-12-28: 10 mL via INTRAVENOUS

## 2025-01-28 ENCOUNTER — Telehealth (HOSPITAL_BASED_OUTPATIENT_CLINIC_OR_DEPARTMENT_OTHER): Payer: Self-pay

## 2025-01-28 NOTE — Telephone Encounter (Signed)
"  ° °  Pre-operative Risk Assessment    Patient Name: Cameron [name]  DOB: [birth year] MRN: [id number]   Date of last office visit: 04/20/24 with Hochrein Date of next office visit: 04/29/25 with Hochrein   Request for Surgical Clearance    Procedure:  Prostate MRI Fusion Biopsy   Date of Surgery:  Clearance 02/25/25                                  Surgeon:  Dr. Nelda- Brooks  Surgeon's Group or Practice Name:  [facility]  Phone number:  [phone] Fax number:  [phone]   Type of Clearance Requested:   - Medical  - Pharmacy:  Hold Apixaban  (Eliquis ) not indicated   Type of Anesthesia:  not indicated    Additional requests/questions:    Bonney Augustin JONETTA Delores   01/28/2025, 12:40 PM   "

## 2025-02-08 ENCOUNTER — Ambulatory Visit: Admitting: Nurse Practitioner

## 2025-04-29 ENCOUNTER — Ambulatory Visit: Admitting: Cardiology

## 2025-08-20 ENCOUNTER — Other Ambulatory Visit (HOSPITAL_COMMUNITY)

## 2025-09-20 ENCOUNTER — Ambulatory Visit: Admitting: Urology
